# Patient Record
Sex: Female | Born: 1980 | Race: Black or African American | Hispanic: No | Marital: Married | Smoking: Never smoker
Health system: Southern US, Community
[De-identification: ages and names within clinical notes are randomized; demographics above are authoritative.]

## PROBLEM LIST (undated history)

## (undated) DIAGNOSIS — F419 Anxiety disorder, unspecified: Secondary | ICD-10-CM

## (undated) DIAGNOSIS — R011 Cardiac murmur, unspecified: Secondary | ICD-10-CM

## (undated) DIAGNOSIS — B019 Varicella without complication: Secondary | ICD-10-CM

## (undated) DIAGNOSIS — D573 Sickle-cell trait: Secondary | ICD-10-CM

## (undated) DIAGNOSIS — R0602 Shortness of breath: Secondary | ICD-10-CM

## (undated) DIAGNOSIS — F32A Depression, unspecified: Secondary | ICD-10-CM

## (undated) DIAGNOSIS — M549 Dorsalgia, unspecified: Secondary | ICD-10-CM

## (undated) DIAGNOSIS — I209 Angina pectoris, unspecified: Secondary | ICD-10-CM

## (undated) DIAGNOSIS — G5603 Carpal tunnel syndrome, bilateral upper limbs: Secondary | ICD-10-CM

## (undated) DIAGNOSIS — J40 Bronchitis, not specified as acute or chronic: Secondary | ICD-10-CM

## (undated) DIAGNOSIS — F329 Major depressive disorder, single episode, unspecified: Secondary | ICD-10-CM

## (undated) DIAGNOSIS — L709 Acne, unspecified: Secondary | ICD-10-CM

## (undated) DIAGNOSIS — R51 Headache: Secondary | ICD-10-CM

## (undated) DIAGNOSIS — E785 Hyperlipidemia, unspecified: Secondary | ICD-10-CM

## (undated) DIAGNOSIS — G4730 Sleep apnea, unspecified: Secondary | ICD-10-CM

## (undated) DIAGNOSIS — D649 Anemia, unspecified: Secondary | ICD-10-CM

## (undated) HISTORY — DX: Anemia, unspecified: D64.9

## (undated) HISTORY — DX: Varicella without complication: B01.9

## (undated) HISTORY — DX: Depression, unspecified: F32.A

## (undated) HISTORY — DX: Shortness of breath: R06.02

## (undated) HISTORY — PX: BACK SURGERY: SHX140

## (undated) HISTORY — DX: Hyperlipidemia, unspecified: E78.5

## (undated) HISTORY — DX: Dorsalgia, unspecified: M54.9

## (undated) HISTORY — DX: Major depressive disorder, single episode, unspecified: F32.9

---
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
## 2002-01-31 HISTORY — PX: DIAGNOSTIC LAPAROSCOPY: SUR761

## 2003-05-02 HISTORY — PX: ANKLE SURGERY: SHX546

## 2005-03-31 HISTORY — PX: TONSILLECTOMY: SUR1361

## 2005-07-27 ENCOUNTER — Emergency Department: Payer: Self-pay | Admitting: General Practice

## 2005-12-10 DIAGNOSIS — O24919 Unspecified diabetes mellitus in pregnancy, unspecified trimester: Secondary | ICD-10-CM | POA: Insufficient documentation

## 2006-10-11 ENCOUNTER — Emergency Department (HOSPITAL_COMMUNITY): Admission: EM | Admit: 2006-10-11 | Discharge: 2006-10-11 | Payer: Self-pay | Admitting: Emergency Medicine

## 2007-08-01 HISTORY — PX: VAGINAL HYSTERECTOMY: SUR661

## 2009-10-01 HISTORY — PX: LAMINECTOMY AND MICRODISCECTOMY LUMBAR SPINE: SHX1913

## 2009-10-14 ENCOUNTER — Ambulatory Visit (HOSPITAL_COMMUNITY): Admission: RE | Admit: 2009-10-14 | Discharge: 2009-10-15 | Payer: Self-pay | Admitting: Neurosurgery

## 2010-03-02 ENCOUNTER — Emergency Department (HOSPITAL_BASED_OUTPATIENT_CLINIC_OR_DEPARTMENT_OTHER)
Admission: EM | Admit: 2010-03-02 | Discharge: 2010-03-02 | Payer: Self-pay | Source: Home / Self Care | Admitting: Emergency Medicine

## 2010-03-02 LAB — URINALYSIS, ROUTINE W REFLEX MICROSCOPIC
Hgb urine dipstick: NEGATIVE
Specific Gravity, Urine: 1.021 (ref 1.005–1.030)
Urine Glucose, Fasting: NEGATIVE mg/dL
pH: 5.5 (ref 5.0–8.0)

## 2010-03-02 LAB — DIFFERENTIAL
Basophils Absolute: 0 10*3/uL (ref 0.0–0.1)
Lymphocytes Relative: 32 % (ref 12–46)
Lymphs Abs: 2.3 10*3/uL (ref 0.7–4.0)
Neutro Abs: 4.3 10*3/uL (ref 1.7–7.7)

## 2010-03-02 LAB — CBC
HCT: 36.5 % (ref 36.0–46.0)
Hemoglobin: 12.3 g/dL (ref 12.0–15.0)
WBC: 7.1 10*3/uL (ref 4.0–10.5)

## 2010-03-02 LAB — GLUCOSE, CAPILLARY
Glucose-Capillary: 95 mg/dL (ref 70–99)
Glucose-Capillary: 99 mg/dL (ref 70–99)

## 2010-03-02 LAB — BASIC METABOLIC PANEL
CO2: 24 mEq/L (ref 19–32)
Calcium: 10.1 mg/dL (ref 8.4–10.5)
Chloride: 107 mEq/L (ref 96–112)
GFR calc Af Amer: >60 mL/min (ref >60)
Potassium: 4.2 mEq/L (ref 3.5–5.1)
Sodium: 143 mEq/L (ref 135–145)

## 2010-04-13 ENCOUNTER — Other Ambulatory Visit: Payer: Self-pay | Admitting: Neurosurgery

## 2010-04-13 DIAGNOSIS — M545 Low back pain, unspecified: Secondary | ICD-10-CM

## 2010-04-13 DIAGNOSIS — M79604 Pain in right leg: Secondary | ICD-10-CM

## 2010-04-15 LAB — BASIC METABOLIC PANEL
BUN: 7 mg/dL (ref 6–23)
CO2: 28 mEq/L (ref 19–32)
Calcium: 9.5 mg/dL (ref 8.4–10.5)
Chloride: 102 mEq/L (ref 96–112)
Creatinine, Ser: 0.92 mg/dL (ref 0.4–1.2)
GFR calc Af Amer: >60 mL/min (ref >60)
GFR calc non Af Amer: >60 mL/min (ref >60)
Glucose, Bld: 120 mg/dL — ABNORMAL HIGH (ref 70–99)
Potassium: 4.6 mEq/L (ref 3.5–5.1)
Sodium: 137 mEq/L (ref 135–145)

## 2010-04-15 LAB — SURGICAL PCR SCREEN
MRSA, PCR: NEGATIVE
Staphylococcus aureus: NEGATIVE

## 2010-04-15 LAB — CBC
HCT: 37.5 % (ref 36.0–46.0)
Platelets: 332 10*3/uL (ref 150–400)
RDW: 13.6 % (ref 11.5–15.5)
WBC: 6.9 10*3/uL (ref 4.0–10.5)

## 2010-04-15 LAB — GLUCOSE, CAPILLARY
Glucose-Capillary: 103 mg/dL — ABNORMAL HIGH (ref 70–99)
Glucose-Capillary: 104 mg/dL — ABNORMAL HIGH (ref 70–99)
Glucose-Capillary: 107 mg/dL — ABNORMAL HIGH (ref 70–99)

## 2010-04-16 ENCOUNTER — Ambulatory Visit
Admission: RE | Admit: 2010-04-16 | Discharge: 2010-04-16 | Disposition: A | Discharge status: Home / Self Care | Payer: Medicare HMO | Source: Ambulatory Visit | Attending: Neurosurgery | Admitting: Neurosurgery

## 2010-04-16 DIAGNOSIS — M545 Low back pain, unspecified: Secondary | ICD-10-CM

## 2010-04-16 DIAGNOSIS — M79604 Pain in right leg: Secondary | ICD-10-CM

## 2010-05-18 ENCOUNTER — Emergency Department (HOSPITAL_COMMUNITY)
Admission: EM | Admit: 2010-05-18 | Discharge: 2010-05-18 | Disposition: A | Discharge status: Home / Self Care | Payer: No Typology Code available for payment source | Attending: Emergency Medicine | Admitting: Emergency Medicine

## 2010-05-18 DIAGNOSIS — R209 Unspecified disturbances of skin sensation: Secondary | ICD-10-CM | POA: Insufficient documentation

## 2010-05-18 DIAGNOSIS — M79609 Pain in unspecified limb: Secondary | ICD-10-CM | POA: Insufficient documentation

## 2010-05-18 DIAGNOSIS — E7800 Pure hypercholesterolemia, unspecified: Secondary | ICD-10-CM | POA: Insufficient documentation

## 2010-05-18 DIAGNOSIS — Y929 Unspecified place or not applicable: Secondary | ICD-10-CM | POA: Insufficient documentation

## 2010-05-18 DIAGNOSIS — M545 Low back pain, unspecified: Secondary | ICD-10-CM | POA: Insufficient documentation

## 2010-05-18 DIAGNOSIS — E119 Type 2 diabetes mellitus without complications: Secondary | ICD-10-CM | POA: Insufficient documentation

## 2010-05-18 DIAGNOSIS — Z79899 Other long term (current) drug therapy: Secondary | ICD-10-CM | POA: Insufficient documentation

## 2010-05-18 DIAGNOSIS — M5380 Other specified dorsopathies, site unspecified: Secondary | ICD-10-CM | POA: Insufficient documentation

## 2010-07-04 ENCOUNTER — Emergency Department (HOSPITAL_BASED_OUTPATIENT_CLINIC_OR_DEPARTMENT_OTHER)
Admission: EM | Admit: 2010-07-04 | Discharge: 2010-07-04 | Disposition: A | Discharge status: Home / Self Care | Payer: Managed Care, Other (non HMO) | Attending: Emergency Medicine | Admitting: Emergency Medicine

## 2010-07-04 DIAGNOSIS — E119 Type 2 diabetes mellitus without complications: Secondary | ICD-10-CM | POA: Insufficient documentation

## 2010-07-04 DIAGNOSIS — E7800 Pure hypercholesterolemia, unspecified: Secondary | ICD-10-CM | POA: Insufficient documentation

## 2010-07-04 DIAGNOSIS — R112 Nausea with vomiting, unspecified: Secondary | ICD-10-CM | POA: Insufficient documentation

## 2010-07-04 LAB — URINALYSIS, ROUTINE W REFLEX MICROSCOPIC
Nitrite: NEGATIVE
Protein, ur: NEGATIVE mg/dL
Urobilinogen, UA: 0.2 mg/dL (ref 0.0–1.0)

## 2010-07-04 LAB — BASIC METABOLIC PANEL
BUN: 11 mg/dL (ref 6–23)
Creatinine, Ser: 0.9 mg/dL (ref 0.4–1.5)

## 2010-07-04 LAB — PREGNANCY, URINE: Preg Test, Ur: NEGATIVE

## 2010-08-02 ENCOUNTER — Encounter (HOSPITAL_COMMUNITY)
Admission: RE | Admit: 2010-08-02 | Discharge: 2010-08-02 | Disposition: A | Discharge status: Home / Self Care | Payer: Medicaid Other | Source: Ambulatory Visit | Attending: Neurosurgery | Admitting: Neurosurgery

## 2010-08-02 DIAGNOSIS — Z01812 Encounter for preprocedural laboratory examination: Secondary | ICD-10-CM | POA: Insufficient documentation

## 2010-08-02 LAB — BASIC METABOLIC PANEL
Chloride: 103 mEq/L (ref 96–112)
Creatinine, Ser: 0.82 mg/dL (ref 0.50–1.10)
GFR calc Af Amer: >60 mL/min (ref >60)
GFR calc non Af Amer: >60 mL/min (ref >60)
Potassium: 4.2 mEq/L (ref 3.5–5.1)

## 2010-08-02 LAB — CBC
MCHC: 33.4 g/dL (ref 30.0–36.0)
Platelets: 323 10*3/uL (ref 150–400)
RDW: 13.4 % (ref 11.5–15.5)
WBC: 7.2 10*3/uL (ref 4.0–10.5)

## 2010-08-02 LAB — TYPE AND SCREEN
ABO/RH(D): O POS
Antibody Screen: NEGATIVE

## 2010-08-02 LAB — SURGICAL PCR SCREEN: MRSA, PCR: NEGATIVE

## 2010-08-09 ENCOUNTER — Inpatient Hospital Stay (HOSPITAL_COMMUNITY): Admission: RE | Admit: 2010-08-09 | Payer: Medicaid Other | Source: Ambulatory Visit | Admitting: Neurosurgery

## 2010-12-02 ENCOUNTER — Other Ambulatory Visit: Payer: Self-pay | Admitting: Neurosurgery

## 2010-12-02 DIAGNOSIS — M5126 Other intervertebral disc displacement, lumbar region: Secondary | ICD-10-CM

## 2010-12-09 ENCOUNTER — Ambulatory Visit
Admission: RE | Admit: 2010-12-09 | Discharge: 2010-12-09 | Disposition: A | Discharge status: Home / Self Care | Payer: Medicaid Other | Source: Ambulatory Visit | Attending: Neurosurgery | Admitting: Neurosurgery

## 2010-12-09 DIAGNOSIS — M5126 Other intervertebral disc displacement, lumbar region: Secondary | ICD-10-CM

## 2010-12-09 MED ORDER — GADOBENATE DIMEGLUMINE 529 MG/ML IV SOLN
20.0000 mL | Freq: Once | INTRAVENOUS | Status: AC | PRN
  Administered 2010-12-09: 20 mL via INTRAVENOUS

## 2010-12-22 ENCOUNTER — Encounter (HOSPITAL_COMMUNITY): Payer: Self-pay | Admitting: Pharmacy Technician

## 2010-12-29 ENCOUNTER — Encounter (HOSPITAL_COMMUNITY)
Admission: RE | Admit: 2010-12-29 | Discharge: 2010-12-29 | Disposition: A | Discharge status: Home / Self Care | Payer: Managed Care, Other (non HMO) | Source: Ambulatory Visit | Attending: Anesthesiology | Admitting: Anesthesiology

## 2010-12-29 ENCOUNTER — Other Ambulatory Visit: Payer: Self-pay

## 2010-12-29 ENCOUNTER — Encounter (HOSPITAL_COMMUNITY): Payer: Self-pay

## 2010-12-29 ENCOUNTER — Encounter (HOSPITAL_COMMUNITY)
Admission: RE | Admit: 2010-12-29 | Discharge: 2010-12-29 | Disposition: A | Discharge status: Home / Self Care | Payer: Managed Care, Other (non HMO) | Source: Ambulatory Visit | Attending: Neurosurgery | Admitting: Neurosurgery

## 2010-12-29 HISTORY — DX: Headache: R51

## 2010-12-29 HISTORY — DX: Anxiety disorder, unspecified: F41.9

## 2010-12-29 HISTORY — DX: Acne, unspecified: L70.9

## 2010-12-29 HISTORY — DX: Bronchitis, not specified as acute or chronic: J40

## 2010-12-29 HISTORY — DX: Sleep apnea, unspecified: G47.30

## 2010-12-29 LAB — CBC
HCT: 37.3 % (ref 36.0–46.0)
Hemoglobin: 12.2 g/dL (ref 12.0–15.0)
MCHC: 32.7 g/dL (ref 30.0–36.0)
MCV: 81.4 fL (ref 78.0–100.0)
WBC: 4.7 10*3/uL (ref 4.0–10.5)

## 2010-12-29 LAB — PREGNANCY, URINE: Preg Test, Ur: NEGATIVE

## 2010-12-29 LAB — SURGICAL PCR SCREEN
MRSA, PCR: NEGATIVE
Staphylococcus aureus: NEGATIVE

## 2010-12-29 LAB — BASIC METABOLIC PANEL
BUN: 7 mg/dL (ref 6–23)
Chloride: 105 mEq/L (ref 96–112)
Glucose, Bld: 92 mg/dL (ref 70–99)
Potassium: 4.5 mEq/L (ref 3.5–5.1)

## 2010-12-29 NOTE — Pre-Procedure Instructions (Signed)
20 Maria Johnson  12/29/2010   Your procedure is scheduled on:  Thursday January 06, 2011  Report to [facility] at 0530 AM.  Call this number if you have problems the morning of surgery: [phone]   Remember:   Do not eat food:After Midnight.  May have clear liquids: up to 4 Hours before arrival. (1:30am)  Clear liquids include soda, tea, black coffee, apple or grape juice, broth.  Take these medicines the morning of surgery with A SIP OF WATER: xanax, tramadol   Do not wear jewelry, make-up or nail polish.  Do not wear lotions, powders, or perfumes. You may wear deodorant.  Do not shave 48 hours prior to surgery.  Do not bring valuables to the hospital.  Contacts, dentures or bridgework may not be worn into surgery.  Leave suitcase in the car. After surgery it may be brought to your room.  For patients admitted to the hospital, checkout time is 11:00 AM the day of discharge.   Patients discharged the day of surgery will not be allowed to drive home.  Name and phone number of your driver: Will Bonnet [phone]  Special Instructions: CHG Shower Use Special Wash: 1/2 bottle night before surgery and 1/2 bottle morning of surgery.   Please read over the following fact sheets that you were given: Pain Booklet, Coughing and Deep Breathing, Blood Transfusion Information, MRSA Information and Surgical Site Infection Prevention, Bring Brace Day of Surgery

## 2011-01-05 MED ORDER — CEFAZOLIN SODIUM-DEXTROSE 2-3 GM-% IV SOLR
2.0000 g | INTRAVENOUS | Status: AC
  Administered 2011-01-06: 2 g via INTRAVENOUS
  Filled 2011-01-05: qty 50

## 2011-01-06 ENCOUNTER — Inpatient Hospital Stay (HOSPITAL_COMMUNITY)
Admission: RE | Admit: 2011-01-06 | Discharge: 2011-01-10 | DRG: 460 | Disposition: A | Discharge status: Home Health | Payer: Managed Care, Other (non HMO) | Source: Ambulatory Visit | Attending: Neurosurgery | Admitting: Neurosurgery

## 2011-01-06 ENCOUNTER — Inpatient Hospital Stay (HOSPITAL_COMMUNITY): Payer: Managed Care, Other (non HMO)

## 2011-01-06 ENCOUNTER — Encounter (HOSPITAL_COMMUNITY): Payer: Self-pay | Admitting: Anesthesiology

## 2011-01-06 ENCOUNTER — Encounter (HOSPITAL_COMMUNITY): Payer: Self-pay | Admitting: *Deleted

## 2011-01-06 ENCOUNTER — Encounter (HOSPITAL_COMMUNITY)
Admission: RE | Disposition: A | Discharge status: Home Health | Payer: Self-pay | Source: Ambulatory Visit | Attending: Neurosurgery

## 2011-01-06 ENCOUNTER — Inpatient Hospital Stay (HOSPITAL_COMMUNITY): Payer: Managed Care, Other (non HMO) | Admitting: Anesthesiology

## 2011-01-06 DIAGNOSIS — M47817 Spondylosis without myelopathy or radiculopathy, lumbosacral region: Secondary | ICD-10-CM | POA: Diagnosis present

## 2011-01-06 DIAGNOSIS — M5137 Other intervertebral disc degeneration, lumbosacral region: Secondary | ICD-10-CM | POA: Diagnosis present

## 2011-01-06 DIAGNOSIS — Z01818 Encounter for other preprocedural examination: Secondary | ICD-10-CM

## 2011-01-06 DIAGNOSIS — M4317 Spondylolisthesis, lumbosacral region: Secondary | ICD-10-CM

## 2011-01-06 DIAGNOSIS — M51379 Other intervertebral disc degeneration, lumbosacral region without mention of lumbar back pain or lower extremity pain: Secondary | ICD-10-CM | POA: Diagnosis present

## 2011-01-06 DIAGNOSIS — Z01812 Encounter for preprocedural laboratory examination: Secondary | ICD-10-CM

## 2011-01-06 DIAGNOSIS — I959 Hypotension, unspecified: Secondary | ICD-10-CM | POA: Diagnosis present

## 2011-01-06 DIAGNOSIS — R079 Chest pain, unspecified: Secondary | ICD-10-CM | POA: Diagnosis present

## 2011-01-06 DIAGNOSIS — M549 Dorsalgia, unspecified: Secondary | ICD-10-CM

## 2011-01-06 DIAGNOSIS — Q762 Congenital spondylolisthesis: Secondary | ICD-10-CM

## 2011-01-06 DIAGNOSIS — M5126 Other intervertebral disc displacement, lumbar region: Principal | ICD-10-CM | POA: Diagnosis present

## 2011-01-06 DIAGNOSIS — E119 Type 2 diabetes mellitus without complications: Secondary | ICD-10-CM | POA: Diagnosis present

## 2011-01-06 DIAGNOSIS — G4730 Sleep apnea, unspecified: Secondary | ICD-10-CM | POA: Diagnosis present

## 2011-01-06 DIAGNOSIS — E7800 Pure hypercholesterolemia, unspecified: Secondary | ICD-10-CM | POA: Diagnosis present

## 2011-01-06 DIAGNOSIS — Z01810 Encounter for preprocedural cardiovascular examination: Secondary | ICD-10-CM

## 2011-01-06 HISTORY — DX: Carpal tunnel syndrome, bilateral upper limbs: G56.03

## 2011-01-06 HISTORY — DX: Sickle-cell trait: D57.3

## 2011-01-06 HISTORY — PX: ANTERIOR LUMBAR FUSION: SHX1170

## 2011-01-06 HISTORY — PX: LUMBAR FUSION: SHX111

## 2011-01-06 LAB — GLUCOSE, CAPILLARY: Glucose-Capillary: 98 mg/dL (ref 70–99)

## 2011-01-06 SURGERY — ANTERIOR LUMBAR FUSION 1 LEVEL
Anesthesia: General | Site: Abdomen | Wound class: Clean

## 2011-01-06 MED ORDER — TRAMADOL HCL 50 MG PO TABS
50.0000 mg | ORAL_TABLET | Freq: Four times a day (QID) | ORAL | Status: DC | PRN
  Filled 2011-01-06: qty 1

## 2011-01-06 MED ORDER — OXYCODONE-ACETAMINOPHEN 5-325 MG PO TABS
1.0000 | ORAL_TABLET | ORAL | Status: DC | PRN
Start: 2011-01-06 — End: 2011-01-10
  Administered 2011-01-08 – 2011-01-10 (×7): 2 via ORAL
  Filled 2011-01-06 (×8): qty 2

## 2011-01-06 MED ORDER — PHENOL 1.4 % MT LIQD: 1.0000 | OROMUCOSAL | Status: DC | PRN

## 2011-01-06 MED ORDER — PROMETHAZINE HCL 25 MG/ML IJ SOLN: 6.2500 mg | INTRAMUSCULAR | Status: DC | PRN

## 2011-01-06 MED ORDER — PANTOPRAZOLE SODIUM 40 MG IV SOLR
40.0000 mg | Freq: Every day | INTRAVENOUS | Status: DC
  Administered 2011-01-06 – 2011-01-08 (×3): 40 mg via INTRAVENOUS
  Filled 2011-01-06 (×4): qty 40

## 2011-01-06 MED ORDER — NALOXONE HCL 0.4 MG/ML IJ SOLN: 0.4000 mg | INTRAMUSCULAR | Status: DC | PRN

## 2011-01-06 MED ORDER — MORPHINE SULFATE (PF) 1 MG/ML IV SOLN
INTRAVENOUS | Status: DC
  Administered 2011-01-06: 25 mg via INTRAVENOUS
  Administered 2011-01-06: 3 mg via INTRAVENOUS
  Administered 2011-01-07: 10.5 mg via INTRAVENOUS
  Administered 2011-01-07: 1.5 mg via INTRAVENOUS
  Administered 2011-01-07: 6 mg via INTRAVENOUS
  Administered 2011-01-07: 3 mg via INTRAVENOUS
  Administered 2011-01-08: 1.5 mg via INTRAVENOUS
  Administered 2011-01-08: 4.75 mg via INTRAVENOUS
  Filled 2011-01-06 (×3): qty 25

## 2011-01-06 MED ORDER — SODIUM CHLORIDE 0.9 % IJ SOLN: 3.0000 mL | INTRAMUSCULAR | Status: DC | PRN

## 2011-01-06 MED ORDER — LACTATED RINGERS IV SOLN
INTRAVENOUS | Status: DC | PRN
  Administered 2011-01-06 (×3): via INTRAVENOUS

## 2011-01-06 MED ORDER — ONDANSETRON HCL 4 MG/2ML IJ SOLN: 4.0000 mg | Freq: Four times a day (QID) | INTRAMUSCULAR | Status: DC | PRN

## 2011-01-06 MED ORDER — THROMBIN 5000 UNITS EX KIT
PACK | CUTANEOUS | Status: DC | PRN
  Administered 2011-01-06 (×2): 5000 [IU] via TOPICAL

## 2011-01-06 MED ORDER — CYCLOBENZAPRINE HCL 10 MG PO TABS
10.0000 mg | ORAL_TABLET | Freq: Two times a day (BID) | ORAL | Status: DC
  Administered 2011-01-06 – 2011-01-10 (×8): 10 mg via ORAL
  Filled 2011-01-06 (×10): qty 1

## 2011-01-06 MED ORDER — MEPERIDINE HCL 25 MG/ML IJ SOLN: 6.2500 mg | INTRAMUSCULAR | Status: DC | PRN

## 2011-01-06 MED ORDER — ENOXAPARIN SODIUM 40 MG/0.4ML ~~LOC~~ SOLN
40.0000 mg | SUBCUTANEOUS | Status: DC
  Administered 2011-01-07 – 2011-01-10 (×4): 40 mg via SUBCUTANEOUS
  Filled 2011-01-06 (×5): qty 0.4

## 2011-01-06 MED ORDER — VITAMIN D (ERGOCALCIFEROL) 1.25 MG (50000 UNIT) PO CAPS
50000.0000 [IU] | ORAL_CAPSULE | ORAL | Status: DC
  Administered 2011-01-07: 50000 [IU] via ORAL
  Filled 2011-01-06: qty 1

## 2011-01-06 MED ORDER — HYDROMORPHONE HCL PF 1 MG/ML IJ SOLN: 0.2500 mg | INTRAMUSCULAR | Status: DC | PRN

## 2011-01-06 MED ORDER — HYDROCODONE-ACETAMINOPHEN 5-325 MG PO TABS
1.0000 | ORAL_TABLET | ORAL | Status: DC | PRN
  Administered 2011-01-06: 2 via ORAL
  Filled 2011-01-06: qty 2

## 2011-01-06 MED ORDER — HYDROMORPHONE HCL PF 1 MG/ML IJ SOLN
0.2500 mg | INTRAMUSCULAR | Status: DC | PRN
  Administered 2011-01-06 (×4): 0.25 mg via INTRAVENOUS

## 2011-01-06 MED ORDER — MIDAZOLAM HCL 2 MG/2ML IJ SOLN: 0.5000 mg | Freq: Once | INTRAMUSCULAR | Status: DC | PRN

## 2011-01-06 MED ORDER — ONDANSETRON HCL 4 MG/2ML IJ SOLN: 4.0000 mg | INTRAMUSCULAR | Status: DC | PRN

## 2011-01-06 MED ORDER — SODIUM CHLORIDE 0.9 % IJ SOLN: 9.0000 mL | INTRAMUSCULAR | Status: DC | PRN

## 2011-01-06 MED ORDER — MORPHINE SULFATE 2 MG/ML IJ SOLN: 0.0500 mg/kg | INTRAMUSCULAR | Status: DC | PRN

## 2011-01-06 MED ORDER — DOXYCYCLINE HYCLATE 100 MG PO CAPS
100.0000 mg | ORAL_CAPSULE | Freq: Every day | ORAL | Status: DC
  Administered 2011-01-06 – 2011-01-08 (×3): 100 mg via ORAL
  Filled 2011-01-06 (×4): qty 1

## 2011-01-06 MED ORDER — MIDAZOLAM HCL 5 MG/5ML IJ SOLN
INTRAMUSCULAR | Status: DC | PRN
  Administered 2011-01-06 (×2): 1 mg via INTRAVENOUS

## 2011-01-06 MED ORDER — INFLUENZA VIRUS VACC SPLIT PF IM SUSP
0.5000 mL | INTRAMUSCULAR | Status: AC
  Administered 2011-01-07: 0.5 mL via INTRAMUSCULAR
  Filled 2011-01-06: qty 0.5

## 2011-01-06 MED ORDER — ALUM & MAG HYDROXIDE-SIMETH 400-400-40 MG/5ML PO SUSP
30.0000 mL | Freq: Four times a day (QID) | ORAL | Status: DC | PRN
  Filled 2011-01-06: qty 30

## 2011-01-06 MED ORDER — DIPHENHYDRAMINE HCL 12.5 MG/5ML PO ELIX: 12.5000 mg | ORAL_SOLUTION | Freq: Four times a day (QID) | ORAL | Status: DC | PRN

## 2011-01-06 MED ORDER — SODIUM CHLORIDE 0.9 % IR SOLN
Status: DC | PRN
  Administered 2011-01-06: 1000 mL

## 2011-01-06 MED ORDER — INSULIN ASPART 100 UNIT/ML ~~LOC~~ SOLN
0.0000 [IU] | Freq: Three times a day (TID) | SUBCUTANEOUS | Status: DC
  Administered 2011-01-07: 2 [IU] via SUBCUTANEOUS
  Filled 2011-01-06: qty 3

## 2011-01-06 MED ORDER — ACETAMINOPHEN 325 MG PO TABS
650.0000 mg | ORAL_TABLET | ORAL | Status: DC | PRN
  Administered 2011-01-07: 650 mg via ORAL
  Filled 2011-01-06: qty 2

## 2011-01-06 MED ORDER — SODIUM CHLORIDE 0.9 % IV SOLN: 250.0000 mL | INTRAVENOUS | Status: DC

## 2011-01-06 MED ORDER — SIMVASTATIN 10 MG PO TABS
10.0000 mg | ORAL_TABLET | Freq: Every day | ORAL | Status: DC
  Administered 2011-01-06 – 2011-01-09 (×4): 10 mg via ORAL
  Filled 2011-01-06 (×6): qty 1

## 2011-01-06 MED ORDER — ACETAMINOPHEN 650 MG RE SUPP: 650.0000 mg | RECTAL | Status: DC | PRN

## 2011-01-06 MED ORDER — HEMOSTATIC AGENTS (NO CHARGE) OPTIME
TOPICAL | Status: DC | PRN
  Administered 2011-01-06: 1 via TOPICAL

## 2011-01-06 MED ORDER — HYDROCODONE-ACETAMINOPHEN 5-325 MG PO TABS: 1.0000 | ORAL_TABLET | ORAL | Status: DC | PRN

## 2011-01-06 MED ORDER — ROCURONIUM BROMIDE 100 MG/10ML IV SOLN
INTRAVENOUS | Status: DC | PRN
  Administered 2011-01-06: 5 mg via INTRAVENOUS
  Administered 2011-01-06: 50 mg via INTRAVENOUS
  Administered 2011-01-06 (×3): 10 mg via INTRAVENOUS

## 2011-01-06 MED ORDER — SODIUM CHLORIDE 0.9 % IR SOLN
Status: DC | PRN
  Administered 2011-01-06: 09:00:00

## 2011-01-06 MED ORDER — PNEUMOCOCCAL VAC POLYVALENT 25 MCG/0.5ML IJ INJ
0.5000 mL | INJECTION | INTRAMUSCULAR | Status: AC
  Administered 2011-01-07: 0.5 mL via INTRAMUSCULAR
  Filled 2011-01-06: qty 0.5

## 2011-01-06 MED ORDER — PROPOFOL 10 MG/ML IV EMUL
INTRAVENOUS | Status: DC | PRN
  Administered 2011-01-06: 140 mg via INTRAVENOUS
  Administered 2011-01-06: 50 mg via INTRAVENOUS

## 2011-01-06 MED ORDER — METFORMIN HCL ER 500 MG PO TB24
1000.0000 mg | ORAL_TABLET | Freq: Every day | ORAL | Status: DC
  Administered 2011-01-07 – 2011-01-10 (×4): 1000 mg via ORAL
  Filled 2011-01-06 (×5): qty 2

## 2011-01-06 MED ORDER — INSULIN ASPART 100 UNIT/ML ~~LOC~~ SOLN
4.0000 [IU] | Freq: Three times a day (TID) | SUBCUTANEOUS | Status: DC
Start: 2011-01-06 — End: 2011-01-09
  Administered 2011-01-06 – 2011-01-07 (×3): 4 [IU] via SUBCUTANEOUS
  Filled 2011-01-06: qty 3

## 2011-01-06 MED ORDER — SODIUM CHLORIDE 0.9 % IJ SOLN
3.0000 mL | Freq: Two times a day (BID) | INTRAMUSCULAR | Status: DC
  Administered 2011-01-07 (×2): 3 mL via INTRAVENOUS

## 2011-01-06 MED ORDER — DIAZEPAM 5 MG PO TABS
5.0000 mg | ORAL_TABLET | Freq: Four times a day (QID) | ORAL | Status: DC | PRN
  Administered 2011-01-06 – 2011-01-07 (×2): 5 mg via ORAL
  Filled 2011-01-06 (×2): qty 1

## 2011-01-06 MED ORDER — ZOLPIDEM TARTRATE 10 MG PO TABS: 10.0000 mg | ORAL_TABLET | Freq: Every evening | ORAL | Status: DC | PRN

## 2011-01-06 MED ORDER — LACTATED RINGERS IV SOLN: INTRAVENOUS | Status: DC

## 2011-01-06 MED ORDER — MENTHOL 3 MG MT LOZG: 1.0000 | LOZENGE | OROMUCOSAL | Status: DC | PRN

## 2011-01-06 MED ORDER — NEOSTIGMINE METHYLSULFATE 1 MG/ML IJ SOLN
INTRAMUSCULAR | Status: DC | PRN
  Administered 2011-01-06: 3 mg via INTRAVENOUS

## 2011-01-06 MED ORDER — ALPRAZOLAM 0.5 MG PO TABS: 1.0000 mg | ORAL_TABLET | Freq: Every day | ORAL | Status: DC | PRN

## 2011-01-06 MED ORDER — ONDANSETRON HCL 4 MG/2ML IJ SOLN
INTRAMUSCULAR | Status: DC | PRN
  Administered 2011-01-06: 4 mg via INTRAVENOUS

## 2011-01-06 MED ORDER — INSULIN ASPART 100 UNIT/ML ~~LOC~~ SOLN: 0.0000 [IU] | Freq: Every day | SUBCUTANEOUS | Status: DC

## 2011-01-06 MED ORDER — FENTANYL CITRATE 0.05 MG/ML IJ SOLN
INTRAMUSCULAR | Status: DC | PRN
  Administered 2011-01-06: 50 ug via INTRAVENOUS
  Administered 2011-01-06: 100 ug via INTRAVENOUS
  Administered 2011-01-06 (×4): 50 ug via INTRAVENOUS
  Administered 2011-01-06: 150 ug via INTRAVENOUS

## 2011-01-06 MED ORDER — GLYCOPYRROLATE 0.2 MG/ML IJ SOLN
INTRAMUSCULAR | Status: DC | PRN
  Administered 2011-01-06: .4 mg via INTRAVENOUS

## 2011-01-06 MED ORDER — HETASTARCH-ELECTROLYTES 6 % IV SOLN
INTRAVENOUS | Status: DC | PRN
  Administered 2011-01-06: 09:00:00 via INTRAVENOUS

## 2011-01-06 MED ORDER — CEFAZOLIN SODIUM 1-5 GM-% IV SOLN
1.0000 g | Freq: Three times a day (TID) | INTRAVENOUS | Status: AC
  Administered 2011-01-06 (×2): 1 g via INTRAVENOUS
  Filled 2011-01-06 (×2): qty 50

## 2011-01-06 MED ORDER — KCL IN DEXTROSE-NACL 20-5-0.45 MEQ/L-%-% IV SOLN
INTRAVENOUS | Status: DC
  Administered 2011-01-06: 17:00:00 via INTRAVENOUS
  Filled 2011-01-06 (×5): qty 1000

## 2011-01-06 MED ORDER — BISACODYL 10 MG RE SUPP
10.0000 mg | Freq: Every day | RECTAL | Status: DC | PRN
  Filled 2011-01-06: qty 1

## 2011-01-06 MED ORDER — DIPHENHYDRAMINE HCL 50 MG/ML IJ SOLN: 12.5000 mg | Freq: Four times a day (QID) | INTRAMUSCULAR | Status: DC | PRN

## 2011-01-06 SURGICAL SUPPLY — 85 items
12mm Sacral Plate ×2 IMPLANT
25mm Screw ×6 IMPLANT
ALIF Cage Medium 14mm 12 degree ×2 IMPLANT
APPLIER CLIP 11 MED OPEN (CLIP) ×2
BUR BARREL STRAIGHT FLUTE 4.0 (BURR) IMPLANT
CANISTER SUCTION 2500CC (MISCELLANEOUS) ×2 IMPLANT
CLIP APPLIE 11 MED OPEN (CLIP) ×1 IMPLANT
CLOTH BEACON ORANGE TIMEOUT ST (SAFETY) ×2 IMPLANT
CONT SPEC 4OZ CLIKSEAL STRL BL (MISCELLANEOUS) ×2 IMPLANT
COVER BACK TABLE 24X17X13 BIG (DRAPES) IMPLANT
COVER TABLE BACK 60X90 (DRAPES) IMPLANT
DERMABOND ADVANCED (GAUZE/BANDAGES/DRESSINGS) ×1
DERMABOND ADVANCED .7 DNX12 (GAUZE/BANDAGES/DRESSINGS) ×1 IMPLANT
DRAPE C-ARM 42X72 X-RAY (DRAPES) ×6 IMPLANT
DRAPE INCISE IOBAN 66X45 STRL (DRAPES) IMPLANT
DRAPE LAPAROTOMY 100X72X124 (DRAPES) ×2 IMPLANT
DRAPE POUCH INSTRU U-SHP 10X18 (DRAPES) ×2 IMPLANT
DRESSING TELFA 8X3 (GAUZE/BANDAGES/DRESSINGS) IMPLANT
DURAPREP 26ML APPLICATOR (WOUND CARE) ×2 IMPLANT
ELECT BLADE 4.0 EZ CLEAN MEGAD (MISCELLANEOUS) ×2
ELECT REM PT RETURN 9FT ADLT (ELECTROSURGICAL) ×2
ELECTRODE BLDE 4.0 EZ CLN MEGD (MISCELLANEOUS) ×1 IMPLANT
ELECTRODE REM PT RTRN 9FT ADLT (ELECTROSURGICAL) ×1 IMPLANT
GAUZE SPONGE 4X4 16PLY XRAY LF (GAUZE/BANDAGES/DRESSINGS) IMPLANT
GLOVE BIO SURGEON STRL SZ8 (GLOVE) ×2 IMPLANT
GLOVE BIOGEL PI IND STRL 7.5 (GLOVE) ×1 IMPLANT
GLOVE BIOGEL PI IND STRL 8 (GLOVE) ×1 IMPLANT
GLOVE BIOGEL PI IND STRL 8.5 (GLOVE) ×1 IMPLANT
GLOVE BIOGEL PI INDICATOR 7.5 (GLOVE) ×1
GLOVE BIOGEL PI INDICATOR 8 (GLOVE) ×1
GLOVE BIOGEL PI INDICATOR 8.5 (GLOVE) ×1
GLOVE ECLIPSE 7.5 STRL STRAW (GLOVE) ×10 IMPLANT
GLOVE EXAM NITRILE LRG STRL (GLOVE) IMPLANT
GLOVE EXAM NITRILE MD LF STRL (GLOVE) ×2 IMPLANT
GLOVE EXAM NITRILE XL STR (GLOVE) IMPLANT
GLOVE EXAM NITRILE XS STR PU (GLOVE) IMPLANT
GOWN BRE IMP SLV AUR LG STRL (GOWN DISPOSABLE) IMPLANT
GOWN BRE IMP SLV AUR XL STRL (GOWN DISPOSABLE) ×2 IMPLANT
GOWN STRL NON-REIN LRG LVL3 (GOWN DISPOSABLE) ×2 IMPLANT
GOWN STRL REIN 2XL LVL4 (GOWN DISPOSABLE) ×4 IMPLANT
GRANULES NEXOSS CDS 10CC (Bone Implant) ×2 IMPLANT
IMBIBE BONE MARROW NEEDLE ×2 IMPLANT
INSERT FOGARTY 61MM (MISCELLANEOUS) IMPLANT
INSERT FOGARTY SM (MISCELLANEOUS) IMPLANT
KIT BASIN OR (CUSTOM PROCEDURE TRAY) ×2 IMPLANT
KIT INFUSE X SMALL 1.4CC (Orthopedic Implant) ×2 IMPLANT
KIT ROOM TURNOVER OR (KITS) ×2 IMPLANT
LOOP VESSEL MAXI BLUE (MISCELLANEOUS) IMPLANT
LOOP VESSEL MINI RED (MISCELLANEOUS) IMPLANT
NEEDLE BONE MARROW 8GAX6 (NEEDLE) ×2 IMPLANT
NEEDLE HYPO 25X1 1.5 SAFETY (NEEDLE) IMPLANT
NEEDLE SPNL 18GX3.5 QUINCKE PK (NEEDLE) ×2 IMPLANT
NS IRRIG 1000ML POUR BTL (IV SOLUTION) ×2 IMPLANT
PACK LAMINECTOMY NEURO (CUSTOM PROCEDURE TRAY) ×2 IMPLANT
PAD ARMBOARD 7.5X6 YLW CONV (MISCELLANEOUS) ×4 IMPLANT
SCREW 30MM (Screw) ×2 IMPLANT
SPONGE GAUZE 4X4 12PLY (GAUZE/BANDAGES/DRESSINGS) IMPLANT
SPONGE INTESTINAL PEANUT (DISPOSABLE) ×8 IMPLANT
SPONGE LAP 18X18 X RAY DECT (DISPOSABLE) ×2 IMPLANT
SPONGE LAP 4X18 X RAY DECT (DISPOSABLE) IMPLANT
SPONGE SURGIFOAM ABS GEL SZ50 (HEMOSTASIS) ×2 IMPLANT
STAPLER VISISTAT 35W (STAPLE) IMPLANT
SUT PROLENE 4 0 RB 1 (SUTURE)
SUT PROLENE 4-0 RB1 .5 CRCL 36 (SUTURE) IMPLANT
SUT PROLENE 5 0 CC1 (SUTURE) IMPLANT
SUT SILK 0 TIES 10X30 (SUTURE) IMPLANT
SUT SILK 2 0 TIES 10X30 (SUTURE) IMPLANT
SUT SILK 2 0 TIES 17X18 (SUTURE) ×1
SUT SILK 2-0 18XBRD TIE BLK (SUTURE) ×1 IMPLANT
SUT SILK 3 0 TIES 10X30 (SUTURE) IMPLANT
SUT VIC AB 0 CT1 27 (SUTURE) ×1
SUT VIC AB 0 CT1 27XBRD ANBCTR (SUTURE) ×1 IMPLANT
SUT VIC AB 1 CT1 18XBRD ANBCTR (SUTURE) IMPLANT
SUT VIC AB 1 CT1 8-18 (SUTURE)
SUT VIC AB 2-0 CT1 18 (SUTURE) ×4 IMPLANT
SUT VIC AB 2-0 CT1 27 (SUTURE)
SUT VIC AB 2-0 CT1 27XBRD (SUTURE) IMPLANT
SUT VIC AB 3-0 SH 8-18 (SUTURE) ×4 IMPLANT
SUT VICRYL 4-0 PS2 18IN ABS (SUTURE) IMPLANT
SYR 20ML ECCENTRIC (SYRINGE) ×2 IMPLANT
TOWEL OR 17X24 6PK STRL BLUE (TOWEL DISPOSABLE) ×2 IMPLANT
TOWEL OR 17X26 10 PK STRL BLUE (TOWEL DISPOSABLE) ×2 IMPLANT
TRAP SPECIMEN MUCOUS 40CC (MISCELLANEOUS) ×2 IMPLANT
TRAY FOLEY CATH 14FRSI W/METER (CATHETERS) ×2 IMPLANT
WATER STERILE IRR 1000ML POUR (IV SOLUTION) ×2 IMPLANT

## 2011-01-06 NOTE — Preoperative (Signed)
Beta Blockers   Reason not to administer Beta Blockers:Not Applicable 

## 2011-01-06 NOTE — Transfer of Care (Signed)
Immediate Anesthesia Transfer of Care Note  Patient: Maria Johnson  Procedure(s) Performed:  ANTERIOR LUMBAR FUSION 1 LEVEL - Lumbar five-Sacral one  Anterior Lumbar Interbody Fusion with Instrumentation ; ABDOMINAL EXPOSURE - Anterior Exposure for Neuro procedure  Patient Location: PACU  Anesthesia Type: General  Level of Consciousness: sedated  Airway & Oxygen Therapy: Patient Spontanous Breathing and Patient connected to nasal cannula oxygen  Post-op Assessment: Report given to PACU RN, Post -op Vital signs reviewed and stable and Patient moving all extremities  Post vital signs: Reviewed and stable  Complications: No apparent anesthesia complications

## 2011-01-06 NOTE — Progress Notes (Signed)
VASCULAR PROGRESS NOTE  SUBJECTIVE: Comfortable  PHYSICAL EXAM: Filed Vitals:   01/06/11 0619 01/06/11 1145 01/06/11 1200 01/06/11 1300  BP: 114/76 97/49 112/60   Pulse: 91 123 119   Temp: 98.1 F (36.7 C) 98.4 F (36.9 C)  98 F (36.7 C)  TempSrc: Oral     Resp: 18 23 17    Height: 5\' 10"  (1.778 m)     Weight: 215 lb (97.523 kg)     SpO2: 98% 97% 100%     Dressing dry Palpable left DP pulse   Basename 01/06/11 0611  GLUCAP 98     ASSESSMENT/PLAN: 1. Doing well post op. Pain adequately controlled.   Waverly Ferrari, MD, FACS Beeper: [phone] 01/06/2011

## 2011-01-06 NOTE — Anesthesia Postprocedure Evaluation (Signed)
  Anesthesia Post-op Note  Patient: Maria Johnson  Procedure(s) Performed:  ANTERIOR LUMBAR FUSION 1 LEVEL - Lumbar five-Sacral one  Anterior Lumbar Interbody Fusion with Instrumentation ; ABDOMINAL EXPOSURE - Anterior Exposure for Neuro procedure  Patient Location: PACU  Anesthesia Type: General  Level of Consciousness: awake  Airway and Oxygen Therapy: Patient Spontanous Breathing  Post-op Pain: mild  Post-op Assessment: Post-op Vital signs reviewed  Post-op Vital Signs: stable  Complications: No apparent anesthesia complications

## 2011-01-06 NOTE — Op Note (Signed)
01/06/2011  11:21 AM  PATIENT:  Maria Johnson  30 y.o. female  PRE-OPERATIVE DIAGNOSIS:  Lumbar five-Sacral one spondylolisthesis, herniated nucleus pulposus  POST-OPERATIVE DIAGNOSIS:  Lumbar five-Sacral one spondylolisthesis, herniated nucleus pulposus  PROCEDURE:  Procedure(s): ANTERIOR LUMBAR FUSION 1 LEVEL ABDOMINAL EXPOSURE  SURGEON:  Surgeon(s): Dorian Heckle, MD Chuck Hint, MD  PHYSICIAN ASSISTANT:   ASSISTANTS: Poteat, RN   ANESTHESIA:   general  EBL:  Total I/O In: 2500 [I.V.:2000; IV Piggyback:500] Out: 300 [Urine:200; Blood:100]  BLOOD ADMINISTERED:none  DRAINS: none   LOCAL MEDICATIONS USED:  LIDOCAINE 10CC  SPECIMEN:  No Specimen  DISPOSITION OF SPECIMEN:  N/A  COUNTS:  YES  TOURNIQUET:  * No tourniquets in log *  DICTATION: After the smooth and uncomplicated induction of general endotracheal anesthesia, the patient was placed in the supine position on the operating table and C-arm fluoroscopy was brought in to visualize the L5-S1 interspace area of planned incision was marked and then infiltrated with local lidocaine after prepping and draping in the usual sterile fashion. Dr. Edilia Bo then proceeded to perform exposure and this will be dictated separately by him. After exposure was obtained, and this was confirmed with a lateral radiograph with a marker needle at the L5-S1 interspace, I proceeded to perform a thorough discectomy and decompression of the interspace. Cobb elevators were used after incising the anterior annulus to thoroughly remove the L5-S1 disc and this was removed en bloc. Distraction spacer 14 mm in width was then placed to facilitate further decompression of the interspace and disc material and redundant annulus were removed. thorough decompression of the lateral aspects of the interspace was also performed. A trial sizer was used with a 14 medium 7 lordotic implant and this was compared to a 12 lordotic implant as well. I  felt that the 12 implant fit the confines of the interspace better and therefore elected to use this implant. This was packed with extra small BMP and NexOss sponge which was mixed with menorrhagia blood aspirated from the vertebral body. The implant was tamped into position and its position was confirmed on AP and lateral fluoroscopy. A 12 mm sacral plate was utilized with 25 mm screws at S1 and 30 mm screw on the left at L5 and a 25 mm screw on the right at L5 all screws had excellent purchase and their positioning was confirmed on AP and lateral fluoroscopy locking mechanisms were engaged appropriately. The self-retaining retractors were then removed without evidence of soft tissue or vascular injury. A final radiograph was obtained which confirmed that there was no evidence of retained foreign body. The fascia was closed with 0 Vicryl sutures after the wound was extensively irrigated. 20 and 3-0 Vicryl interrupted stitches were used to reapproximate the fat and subcutaneous and subcuticular layers. A dressing of Dermabond was placed. Patient was extubated in the operating room and taken to recovery in stable satisfactory condition having told her operation well without apparent complication. Counts were correct at the end of the case.  PLAN OF CARE: Admit to inpatient   PATIENT DISPOSITION:  PACU - hemodynamically stable.   Delay start of Pharmacological VTE agent (>24hrs) due to surgical blood loss or risk of bleeding:  YES

## 2011-01-06 NOTE — Anesthesia Preprocedure Evaluation (Addendum)
Anesthesia Evaluation  Patient identified by MRN, date of birth, ID band Patient awake    Reviewed: Allergy & Precautions, H&P , NPO status , Patient's Chart, lab work & pertinent test results  Airway Mallampati: II TM Distance: >3 FB Neck ROM: Full    Dental  (+) Teeth Intact and Dental Advisory Given   Pulmonary sleep apnea and Continuous Positive Airway Pressure Ventilation , pneumonia ,          Cardiovascular neg cardio ROS     Neuro/Psych  Headaches,    GI/Hepatic   Endo/Other  Diabetes mellitus-, Type 2, Oral Hypoglycemic Agents  Renal/GU      Musculoskeletal negative musculoskeletal ROS (+)   Abdominal   Peds  Hematology   Anesthesia Other Findings   Reproductive/Obstetrics                         Anesthesia Physical Anesthesia Plan  ASA: II  Anesthesia Plan: General   Post-op Pain Management:    Induction: Intravenous  Airway Management Planned: Oral ETT  Additional Equipment:   Intra-op Plan:   Post-operative Plan: Extubation in OR  Informed Consent:   Dental advisory given  Plan Discussed with: Anesthesiologist and Surgeon  Anesthesia Plan Comments:        Anesthesia Quick Evaluation

## 2011-01-06 NOTE — Progress Notes (Signed)
Patient ID: Maria Johnson, female   DOB: 03-Nov-1980, 30 y.o.   MRN: [id number] Alert, conversant. No back pain. No leg pain. Pt does c/o abdominal pain at and medial to incision. BS hypo. Abd soft, nontender away from incision. Incision with Dermabond. No erythema, swelling, or drainage. Good strength BLE.

## 2011-01-06 NOTE — OR Nursing (Signed)
Preop pt states R leg pain. Pulse oximeter placed on L Great Toe for duration of procedure.

## 2011-01-06 NOTE — Op Note (Signed)
01/06/2011  PREOP DIAGNOSIS: Degenerative disc disease at L5-S1  POSTOP DIAGNOSIS: Same  PROCEDURE: Anterior retroperitoneal exposure of L5-S1  SURGEON: Di Kindle. Edilia Bo, MD, FACS  ASSIST: Georgiann Cocker  ANESTHESIA: Gen.   EBL: minimal  FINDINGS: L5-S1 disc was below the confluence of the iliac veins  INDICATIONS: This is a pleasant 30 year old woman with degenerative disc disease at L5-S1. I was asked to provide anterior retroperitoneal exposure.  TECHNIQUE: The patient was brought to the operating room and monitoring lines were placed by anesthesia. The patient received a general anesthetic. The abdomen was prepped and draped in the usual sterile fashion after the level of the L5-S1 disc was marked under fluoroscopy using a lateral projection. A transverse incision was made at this level and the dissection carried down through the subcutaneous tissue to the anterior rectus sheath. The anterior rectus sheath was divided transversely extending across the midline exposing a small amount of the right rectus abdominis muscle. Laterally the incision in the anterior rectus sheath was extended out to the lateral border of the left rectus abdominis muscle. The anterior rectus sheath was then mobilized superiorly and inferiorly allowing full mobilization of the rectus abdominis muscle. This was initially retracted medially. The retroperitoneal space was entered and the dissection carried down to the psoas muscle and then the iliac artery was mobilized and retracted laterally allowing exposure of the L5-S1 disc. The middle sacral vessels were doubly clipped and divided. The L5-S1 disc was exposed enough so that the reverse lip retractors could be placed on the right side of the disc and then on the left side the disc allowing adequate exposure of the disc for ALIF as dictated by Dr. Venetia Maxon.  The remainder of the procedure is as dictated by Dr. Venetia Maxon.  Waverly Ferrari, MD, FACS [facility] [place]  DATE OF OPERATION: 01/06/2011 DATE OF DICTATION: 01/06/2011

## 2011-01-06 NOTE — Consult Note (Signed)
[facility]  Patient name: Maria Johnson MRN: [id number] DOB: 1980-11-28 Sex: female  REASON FOR CONSULT: Evaluate for anterior retroperitoneal exposure of L5-S1. Consult from Dr.Stern.  HPI: Maria Johnson is a 30 y.o. female who developed the sudden onset of paresthesias in both lower extremities in February of 2011. She has had chronic back pain since that time. She continues to have paresthesias in her right leg. Her pain in her back is aggravated by standing and sitting.  She has tried physical therapy with minimal relief. His also had injection therapy which did not relieve her symptoms. She was noted to have degenerative disc disease at L5-S1 and vascular surgery was consult for exposure at this level.  Past Medical History  Diagnosis Date  . Pneumonia   . Bronchitis     history of  . Sleep apnea     has cpap  . Diabetes mellitus   . Headache     hx of migraines  . Acne     takes doxocycline daily for acne  . Anxiety     History reviewed. No pertinent family history.  SOCIAL HISTORY: History  Substance Use Topics  . Smoking status: Never Smoker   . Smokeless tobacco: Not on file  . Alcohol Use: Yes     occassional    No Known Allergies  Current Facility-Administered Medications  Medication Dose Route Frequency Provider Last Rate Last Dose  . ceFAZolin (ANCEF) IVPB 2 g/50 mL premix  2 g Intravenous 60 min Pre-Op Dorian Heckle, MD       Facility-Administered Medications Ordered in Other Encounters  Medication Dose Route Frequency Provider Last Rate Last Dose  . lactated ringers infusion    Continuous PRN Randel K Temples        REVIEW OF SYSTEMS: Arly.Keller ] denotes positive finding; [  ] denotes negative finding CARDIOVASCULAR:  [ ]  chest pain   [ ]  chest pressure   [ ]  palpitations   [ ]  orthopnea   [ ]  dyspnea on exertion   [ ]  claudication   [ ]  rest pain   [ ]  DVT   [ ]  phlebitis PULMONARY:   [ ]  productive cough   [ ]  asthma   [ ]   wheezing NEUROLOGIC:   [ ]  weakness  [ ]  paresthesias  [ ]  aphasia  [ ]  amaurosis  [ ]  dizziness HEMATOLOGIC:   [ ]  bleeding problems   [ ]  clotting disorders MUSCULOSKELETAL:  [ ]  joint pain   [ ]  joint swelling [ ]  leg swelling GASTROINTESTINAL: [ ]   blood in stool  [ ]   hematemesis GENITOURINARY:  [ ]   dysuria  [ ]   hematuria PSYCHIATRIC:  [ ]  history of major depression INTEGUMENTARY:  [ ]  rashes  [ ]  ulcers CONSTITUTIONAL:  [ ]  fever   [ ]  chills  PHYSICAL EXAM: Filed Vitals:   01/06/11 0619  BP: 114/76  Pulse: 91  Temp: 98.1 F (36.7 C)  TempSrc: Oral  Resp: 18  SpO2: 98%   Weight is 215 pounds. Height is 5 feet 10 inches. BMI = 30.9  There is no height or weight on file to calculate BMI. GENERAL: The patient is a well-nourished female, in no acute distress. The vital signs are documented above. CARDIOVASCULAR: There is a regular rate and rhythm without significant murmur appreciated. No carotid bruits. Palpable posterior tibial pulses bilaterally. Palpable left dorsalis pedis pulse. I cannot palpate a right dorsalis pedis pulse  the PULMONARY: There is good air exchange bilaterally without wheezing or rales. ABDOMEN: Soft and non-tender with normal pitched bowel sounds. She has significant obesity with a large pannus. MUSCULOSKELETAL: There are no major deformities or cyanosis. NEUROLOGIC: No focal weakness or paresthesias are detected. SKIN: There are no ulcers or rashes noted. PSYCHIATRIC: The patient has a normal affect.  DATA:  Lab Results  Component Value Date   WBC 4.7 12/29/2010   HGB 12.2 12/29/2010   HCT 37.3 12/29/2010   MCV 81.4 12/29/2010   PLT 373 12/29/2010   Lab Results  Component Value Date   NA 141 12/29/2010   K 4.5 12/29/2010   CL 105 12/29/2010   CO2 26 12/29/2010   Lab Results  Component Value Date   CREATININE 0.82 12/29/2010   CT scan of lumbar spine: Shows degenerative disc disease at L5-S1. There is mild neural foraminal narrowing on  the right at this level which could irritate the right L5 nerve root.   MEDICAL ISSUES: The patient appears to be a reasonable candidate for anterior retroperitoneal exposure of L5-S1. She is at increased risk for surgery because of her obesity. I have reviewed our role in exposure of the spine in order to allow anterior lumbar interbody fusion at the appropriate levels. We have discussed the potential complications of surgery, including but not limited to, arterial or venous injury, thrombosis, or bleeding. We have also discussed the potential risks of wound healing problems, the development of a hernia, nerve injury, leg swelling, or other unpredictable medical problems.  All the patient's questions were answered and they are agreeable to proceed.   Daylin Eads S [facility] [place] Beeper: [phone]

## 2011-01-06 NOTE — H&P (Signed)
NEUROSURGICAL HISTORY AND PHYSICAL   Maria Johnson   DOB:  06-01-1980      HISTORY:     Maria Johnson is a 30 year old woman who works at [employer] through [employer] who comes today for a second opinion regarding low back and right lower extremity pain.  She previously had a right L5-S1 discectomy by Dr. Wynetta Emery in 10/2009 and she says it helped her pain "only some". She says she is still having a lot of right leg pain involving her right buttock, all her toes, and also complains of some tingling into her second through fourth digits.  She has had previous injections which she said helped her on the left, but not on the right.  She has been out of work since 03/2009.  She has had a myelogram performed on 04/16/2010 and an MRI from 01/2010.  She had physical therapy after her discectomy in 2011. She has been taking Flexeril 10 mg. twice daily, Ibuprofen 800 mg., Vicodin and Tramadol, but says she is not taking those because they are not helpful. She says she does not take the Gabapentin because it has not helped her.  She currently describes burning into her right buttock and all her toes.    REVIEW OF SYSTEMS:   A detailed Review of Systems sheet was reviewed with the patient.  Pertinent positives include high cholesterol, leg pain while walking, change in bowel habits, leg weakness, back pain, leg pain, and diabetes.  All other systems are negative; this includes Constitutional symptoms, Eyes, Ears, nose, mouth, throat, Respiratory, Genitourinary, Integumentary & Breast, Neurologic, Psychiatric, Hematologic/Lymphatic, Allergic/Immunologic.    PAST MEDICAL HISTORY:      Current Medical Conditions:    She has a history of noninsulin dependent diabetes, elevated cholesterol.      Prior Operations and Hospitalizations:   A hysterectomy in 08/2007, tonsillectomy in 03/2005, left ankle surgery 05/2002, laparoscopic scar tissue removal 02/2003.      Medications and Allergies:  Medications - Metformin ER 500 mg.  b.i.d., Pravastatin 20 mg. q.h.s., Vitamin D 50,000 qweek, Xanax 1 mg. prn, Doxycycline 100 mg. qd, Cyclobenzaprine 10 mg. t.i.d., Ibuprofen 800 mg. prn, Vicodin 500 mg. prn, and Tramadol 50 mg. before surgery.  No known drug allergies.      Height and Weight:     She is 5'10" tall, 214 lbs.   FAMILY HISTORY:    Her mother is 31 in good health with high blood pressure and anemia.  Her father is 82 in good health with anxiety.    SOCIAL HISTORY:    She denies tobacco, alcohol, or drug use.    DIAGNOSTIC STUDIES:   I reviewed an MRI and CT myelogram of the lumbar spine along with EMG and nerve conduction velocity testing.  The imaging studies demonstrate on the MRI that she has disc degeneration at L5-S1 with a rightward disc bulge and herniation displacing the right S1 nerve root.  Additionally, a myelogram demonstrates that she has some foraminal stenosis at L5-S1 which may be irritating the right L5 nerve root.    PHYSICAL EXAMINATION:      General Appearance:   On examination today, Maria Johnson is a pleasant and cooperative woman in no acute distress.      Blood Pressure, Pulse:     Her blood pressure is 122/64.  Heart rate is 74 and regular.  Respiratory rate is 18.      HEENT - normocephalic, atraumatic.  The pupils are equal, round and reactive to  light.  The extraocular muscles are intact.  Sclerae - white.  Conjunctiva - pink.  Oropharynx benign.  Uvula midline.     Neck - there are no masses, meningismus, deformities, tracheal deviation, jugular vein distention or carotid bruits.  There is normal cervical range of motion.  Spurlings' test is negative without reproducible radicular pain turning the patient's head to either side.  Lhermitte's sign is not present with axial compression.      Respiratory - there is normal respiratory effort with good intercostal function.  Lungs are clear to auscultation.  There are no rales, rhonchi or wheezes.      Cardiovascular - the heart has regular  rate and rhythm to auscultation.  No murmurs are appreciated.  There is no extremity edema, cyanosis or clubbing.  There are palpable pedal pulses.      Abdomen - soft, nontender, no hepatosplenomegaly appreciated or masses.  There are active bowel sounds.  No guarding or rebound.      Musculoskeletal Examination - she has right greater than left sciatic notch discomfort to palpation and has a healed midline lumbar incision. She is able to stand on her heels and toes, and able to bend to touch her toes.  She does have a positive Tinel's sign at the right wrist and a positive Phalen's sign on the right; both suggestive of carpal tunnel syndrome.  She has a positive straight leg raise at 45 degrees on the right and negative Patrick's test.  Negative straight leg raise on the left.     NEUROLOGICAL EXAMINATION: The patient is oriented to time, person and place and has good recall of both recent and remote memory with normal attention span and concentration.  The patient speaks with clear and fluent speech and exhibits normal language function and appropriate fund of knowledge.      Cranial Nerve Examination - pupils are equal, round and reactive to light.  Extraocular movements are full.  Visual fields are full to confrontational testing.  Facial sensation and facial movement are symmetric and intact.  Hearing is intact to finger rub.  Palate is upgoing.  Shoulder shrug is symmetric.  Tongue protrudes in the midline.      Motor Examination - motor strength is 5/5 in the bilateral deltoids, biceps, triceps, handgrips, wrist extensors, interosseous.  In the lower extremities motor strength is 5/5 in hip flexion, extension, quadriceps, hamstrings, plantar flexion, dorsiflexion, 5/5 left extensor hallucis longus, and right extensor hallucis longus strength at 4+/5.      Sensory Examination - she has deceased pin sensation in a right L5 distribution.       Deep Tendon Reflexes - 2 in the biceps, triceps, and  brachioradialis, 2 in the knees, 2 in the ankles.  The great toes are downgoing to plantar stimulation.      Cerebellar Examination - normal coordination in upper and lower extremities and normal rapid alternating movements.  Romberg test is negative.    IMPRESSION AND RECOMMENDATIONS: Maria Johnson is a 30 year old woman with low back pain and right lumbar radiculopathy, both suggestive of L5 and S1 nerve root irritation. She has disc degeneration at L5-S1. She did not improve a great deal following a microdiscectomy.  Imaging studies demonstrate persistent nerve root irritation and she has had a prolonged trial of conservative management. I agree with Dr. Lonie Peak previously stated recommendations of undergoing an anterior lumbar interbody fusion at the L5-S1 level.  She says that she is quite miserable and wants to go ahead  with surgery.  She says that she is frustrated with Dr. Lonie Peak office and what she perceives as his office's lack of responsiveness and says that she would like to transfer her care.  I have recommended that she get a new MRI of her lumbar spine prior to surgery so that we can better clarify the state of this disc and make sure there is no other structural pathology at any other level.  I will plan on doing so and we will then proceed with anterior lumbar decompression and fusion at the L5-S1 level.  We had a lengthy discussion about risks and benefits of surgery and surgical approaches, and she wishes to proceed.  This will be done in conjunction with a vascular surgeon to perform approach.  We discussed the risks and benefits. I showed her models and answered her questions.    [facility]    Danae Orleans. Venetia Maxon, M.D.    JDS:aft

## 2011-01-07 LAB — GLUCOSE, CAPILLARY: Glucose-Capillary: 110 mg/dL — ABNORMAL HIGH (ref 70–99)

## 2011-01-07 MED ORDER — SODIUM CHLORIDE 0.9 % IV BOLUS (SEPSIS)
500.0000 mL | Freq: Once | INTRAVENOUS | Status: AC
  Administered 2011-01-07: 500 mL via INTRAVENOUS

## 2011-01-07 NOTE — Progress Notes (Signed)
Subjective: Patient reports "I'm doing ok. I just get dizzy when I'm up."  Objective: Vital signs in last 24 hours: Temp:  [97.9 F (36.6 C)-99.2 F (37.3 C)] 98.6 F (37 C) (12/07 1013) Pulse Rate:  [64-123] 101  (12/07 1013) Resp:  [14-23] 17  (12/07 1013) BP: (77-126)/(40-78) 94/55 mmHg (12/07 1013) SpO2:  [91 %-100 %] 100 % (12/07 1013)  Intake/Output from previous day: 12/06 0701 - 12/07 0700 In: 3550 [I.V.:3050; IV Piggyback:500] Out: 2550 [Urine:2450; Blood:100] Intake/Output this shift:    Alert, conversant. Denies lumbar and BLE pain. Abdominal pain well-controlled. Abd soft, nontender. incision w/o erythema, swelliing, or drainage. Dermabond intact.  Lab Results: No results found for this basename: WBC:2,HGB:2,HCT:2,PLT:2 in the last 72 hours BMET No results found for this basename: NA:2,K:2,CL:2,CO2:2,GLUCOSE:2,BUN:2,CREATININE:2,CALCIUM:2 in the last 72 hours  Studies/Results: Dg Lumbar Spine 2-3 Views  01/06/2011  *RADIOLOGY REPORT*  Clinical Data: Anterior fusion L5-S1  LUMBAR SPINE - 2-3 VIEW  Comparison: MRI 12/09/2010  Findings: Anterior plate and screws at L5-S1 in good position. Interbody spacer at L5-S1 in good position.  IMPRESSION: Satisfactory ALIF  L5-S1.  Original Report Authenticated By: Camelia Phenes, M.D.   Dg C-arm Gt 120 Min  01/06/2011  CLINICAL DATA: L5-S1 ALIF   C-ARM GT 120 MIN  Fluoroscopy was utilized by the requesting physician.  No radiographic  interpretation.     Dg Or Local Abdomen  01/06/2011  *RADIOLOGY REPORT*  Clinical Data: Anterior fusion at L5-S1, evaluate for retained foreign body  OR LOCAL ABDOMEN  Comparison: Lumbar spine films of 11/29/2010  Findings: A portable supine view shows anterior metallic fusion device at the Z6-X0 level.  To surgical staples are noted in the pelvis.  No retained foreign body is seen.  IMPRESSION: Anterior fusion at L5-S1.  No retained foreign body.  Original Report Authenticated By: Juline Patch, M.D.     Assessment/Plan: Episodes of hypotension with substernal chest pain this am when attempting OOB & sitting. CP short lived per pt & relieved by liquids po. Pt has been flat in bed since PT visited and BP remains in the 90's systolic(and pt asymptomatic supine).  LOS: 1 day  Will discuss intervention vs. rest & slow progression with Dr. Venetia Maxon.    Georgiann Cocker 01/07/2011, 11:09 AM     Chest pain associated with gas.  Will reassess hypotension after fluid bolus.  Doing well from surgery otherwise.

## 2011-01-07 NOTE — Progress Notes (Addendum)
Physical Therapy Evaluation Patient Details Name: Maria Johnson MRN: [id number] DOB: 1980/03/25 Today's Date: 01/07/2011  Problem List: There is no problem list on file for this patient.   Past Medical History:  Past Medical History  Diagnosis Date  . Bronchitis     history of  . Sleep apnea     has cpap  . Diabetes mellitus   . Acne     takes doxocycline daily for acne  . Anxiety   . Carpal tunnel syndrome on both sides   . Pneumonia 1990's  . Sickle cell trait   . Headache     hx of migraines   Past Surgical History:  Past Surgical History  Procedure Date  . Back surgery     Laminectomy Sept 14, 2011,  . Ankle surgery 05/2003    torn cartilage  repair; left  . Lumbar fusion 01/06/11    L5-S1  . Diagnostic laparoscopy 01/2002    removed scar tissue  . Tonsillectomy 03/2005  . Vaginal hysterectomy 08/2007    partial   . Laminectomy and microdiscectomy lumbar spine 10/2009    L4, L5, S1    PT Assessment/Plan/Recommendation PT Assessment Clinical Impression Statement: Patient limited post ALIF secondary to nausea, diaphoresis, and decrease in BP with sitting. Anticipate good progress over time. Patient needs skilled PT to maximize functional abilities to regain independence and promote optimal healing through education. PT Recommendation/Assessment: Patient will need skilled PT in the acute care venue PT Problem List: Decreased knowledge of use of DME;Decreased knowledge of precautions;Pain;Cardiopulmonary status limiting activity;Decreased activity tolerance;Decreased mobility PT Therapy Diagnosis : Acute pain;Difficulty walking PT Plan PT Frequency: Min 5X/week PT Treatment/Interventions: DME instruction;Gait training;Stair training;Functional mobility training;Therapeutic activities;Patient/family education PT Recommendation Follow Up Recommendations: None Equipment Recommended: Rolling walker with 5" wheels;3 in 1 bedside comode PT Goals  Acute Rehab PT Goals PT  Goal Formulation: With patient Time For Goal Achievement: 7 days Pt will Roll Supine to Left Side: with modified independence PT Goal: Rolling Supine to Left Side - Progress: Other (comment) Pt will go Supine/Side to Sit: with modified independence PT Goal: Supine/Side to Sit - Progress: Other (comment) Pt will go Sit to Supine/Side: with modified independence PT Goal: Sit to Supine/Side - Progress: Other (comment) Pt will go Sit to Stand: with modified independence PT Goal: Sit to Stand - Progress: Other (comment) Pt will go Stand to Sit: with modified independence PT Goal: Stand to Sit - Progress: Other (comment) Pt will Transfer Bed to Chair/Chair to Bed: with modified independence PT Transfer Goal: Bed to Chair/Chair to Bed - Progress: Other (comment) Pt will Ambulate: >150 feet;with modified independence PT Goal: Ambulate - Progress: Other (comment) Pt will Go Up / Down Stairs: 3-5 stairs;with rail(s);with supervision PT Goal: Up/Down Stairs - Progress: Other (comment) Additional Goals Additional Goal #1: Patient will recall back precautions and demonstrate adherence to in functional activity  PT Evaluation Precautions/Restrictions  Precautions Precautions: Back Precaution Booklet Issued: Yes (comment) Precaution Comments: Educated in back precautions, posture, and body mechanics Required Braces or Orthoses: Yes Spinal Brace: Lumbar corset;Applied in sitting position Prior Functioning  Home Living Lives With: Family (Going to mothers at discharge) Receives Help From: Family Type of Home: House Home Layout: One level Home Access: Stairs to enter Entrance Stairs-Rails: Left Entrance Stairs-Number of Steps: 5 Bathroom Toilet: Standard Bathroom Accessibility: Yes How Accessible: Accessible via walker Home Adaptive Equipment: None Prior Function Level of Independence: Independent with basic ADLs;Independent with homemaking with ambulation Driving: Yes Vocation:  Full time  employment Comments: Until back pain issues Cognition Cognition Arousal/Alertness: Awake/alert Overall Cognitive Status: Appears within functional limits for tasks assessed Orientation Level: Oriented X4 Sensation/Coordination Sensation Light Touch: Appears Intact Coordination Gross Motor Movements are Fluid and Coordinated: Yes Extremity Assessment RLE Assessment RLE Assessment: Within Functional Limits LLE Assessment LLE Assessment: Within Functional Limits Mobility (including Balance) Bed Mobility Rolling Left: 4: Min assist Rolling Left Details (indicate cue type and reason): With cues on log roll technique Left Sidelying to Sit: 4: Min assist Left Sidelying to Sit Details (indicate cue type and reason): To ensure correct sequencing of loer extremities and trunk Sitting - Scoot to Edge of Bed: 5: Supervision Sit to Supine - Left: 4: Min assist Sit to Supine - Left Details (indicate cue type and reason): For correct sequencing of lower extremities and trunk    End of Session PT - End of Session Equipment Utilized During Treatment: Back brace Activity Tolerance: Treatment limited secondary to medical complications (Comment) (Diaphoretic, nausea, and decreased BP.) BP 77/40 Patient left: in bed Nurse Communication:  (Medical condition) General Behavior During Session: [facility] for tasks performed Cognition: [facility] for tasks performed  Edwyna Perfect, PT  Pager [phone]  01/07/2011, 9:21 AM  Called and left message at Dr. Rush Farmer office as patient did state she also had chest pain with attempts at OOB with nursing earlier this am. Coupled with the symptoms patient experienced with PT I felt it was important to alert MD.  01/07/2011 Edwyna Perfect, PT  Pager [phone]

## 2011-01-07 NOTE — Progress Notes (Addendum)
Patient ID: Maria Johnson, female   DOB: 1980/04/06, 30 y.o.   MRN: [id number] [facility] [place]  Progress Note Bypass Surgery  Date of Surgery: 01/06/2011 Procedure: Procedure(s): ANTERIOR LUMBAR FUSION 1 LEVEL ABDOMINAL EXPOSURE Surgeon: Surgeon(s): Dorian Heckle, MD Chuck Hint, MD POD : 1 Day Post-Op  History of Present Illness  Maria Johnson is a 30 y.o. female who is S/P  ANTERIOR LUMBAR FUSION 1 LEVEL ABDOMINAL EXPOSURE surgery.  The patient's wounds are healing well.   Patients pain is well controlled. She states BLE have good sensation. C/O some burning pain in right thigh.  Significant Diagnostic Studies: CBC    Component Value Date/Time   WBC 4.7 12/29/2010 0935   RBC 4.58 12/29/2010 0935   HGB 12.2 12/29/2010 0935   HCT 37.3 12/29/2010 0935   PLT 373 12/29/2010 0935   MCV 81.4 12/29/2010 0935   MCH 26.6 12/29/2010 0935   MCHC 32.7 12/29/2010 0935   RDW 13.3 12/29/2010 0935   LYMPHSABS 2.3 03/02/2010 2205   MONOABS 0.4 03/02/2010 2205   EOSABS 0.1 03/02/2010 2205   BASOSABS 0.0 03/02/2010 2205    BMET    Component Value Date/Time   NA 141 12/29/2010 0935   K 4.5 12/29/2010 0935   CL 105 12/29/2010 0935   CO2 26 12/29/2010 0935   GLUCOSE 92 12/29/2010 0935   BUN 7 12/29/2010 0935   CREATININE 0.82 12/29/2010 0935   CALCIUM 9.8 12/29/2010 0935   GFRNONAA >90 12/29/2010 0935   GFRAA >90 12/29/2010 0935    COAG No results found for this basename: INR, PROTIME   No results found for this basename: PTT    Physical Examination  BP Readings from Last 3 Encounters:  01/07/11 126/73  01/07/11 126/73  12/29/10 120/87   Temp Readings from Last 3 Encounters:  01/07/11 98.4 F (36.9 C)   01/07/11 98.4 F (36.9 C)   12/29/10 97.4 F (36.3 C) Oral   SpO2 Readings from Last 3 Encounters:  01/07/11 99%  01/07/11 99%  12/29/10 99%      Pt is A&O x 3 LLQ Incision/s is/are clean,dry.intact, and   healing without hematoma, erythema or drainage BLE is warm; with good color  Dorsalis Pedis pulse is palpable on left Posterior tibial pulse is  Palpable Bilaterally  Assessment: Pt. Doing well Post-op pain is controlled Wounds are healing well  extremities are well perfused  Plan: PT/OT for ambulation Will F/U as needed  Maria Johnson [phone] 01/07/2011 8:54 AM        Agree with above.  Please call with any questions

## 2011-01-07 NOTE — Progress Notes (Signed)
Patient ID: Maria Johnson, female   DOB: [birth year], 30 y.o.   MRN: [id number] Alert. No obvious distress. HOB at 45 degrees now w/o dizziness. Systolic up to 116 since two 500cc NS boluses. HR remains 110. Has not sat up fully yet.  Incisional & right buttock discomfort without change, but controlled with meds. T 101.  Incentive spirometer ordered by Dr. Venetia Maxon. BM's x3 this afternoon.

## 2011-01-07 NOTE — Progress Notes (Signed)
CSW met with pt to address consult for possible SNF. For assessment, please see pt's chart. Currently, PT is recommending pt to discharge home with DME. Pt does not have SNF needs at this time. CSW signing off as no further clinical social work needs identified at this time. Please reconsult if a need arises prior to discharge.  Dede Query, MSW, Theresia Majors [phone]

## 2011-01-08 LAB — GLUCOSE, CAPILLARY
Glucose-Capillary: 111 mg/dL — ABNORMAL HIGH (ref 70–99)
Glucose-Capillary: 94 mg/dL (ref 70–99)

## 2011-01-08 NOTE — Progress Notes (Signed)
Subjective: Patient reports Complains of abdominal pain moderate back pain. Tolerating oral medication. Ambulating modestly.  Objective: Vital signs in last 24 hours: Temp:  [98.2 F (36.8 C)-100.4 F (38 C)] 99 F (37.2 C) (12/08 0600) Pulse Rate:  [99-119] 112  (12/08 0600) Resp:  [16-18] 16  (12/08 0600) BP: (94-120)/(54-77) 120/77 mmHg (12/08 0600) SpO2:  [95 %-100 %] 95 % (12/08 0600)  Intake/Output from previous day: 12/07 0701 - 12/08 0700 In: 600 [I.V.:600] Out: -  Intake/Output this shift:    Incision is clean and dry motor function good in lower extremities  Lab Results: No results found for this basename: WBC:2,HGB:2,HCT:2,PLT:2 in the last 72 hours BMET No results found for this basename: NA:2,K:2,CL:2,CO2:2,GLUCOSE:2,BUN:2,CREATININE:2,CALCIUM:2 in the last 72 hours  Studies/Results: Dg Lumbar Spine 2-3 Views  01/06/2011  *RADIOLOGY REPORT*  Clinical Data: Anterior fusion L5-S1  LUMBAR SPINE - 2-3 VIEW  Comparison: MRI 12/09/2010  Findings: Anterior plate and screws at L5-S1 in good position. Interbody spacer at L5-S1 in good position.  IMPRESSION: Satisfactory ALIF  L5-S1.  Original Report Authenticated By: Camelia Phenes, M.D.   Dg C-arm Gt 120 Min  01/06/2011  CLINICAL DATA: L5-S1 ALIF   C-ARM GT 120 MIN  Fluoroscopy was utilized by the requesting physician.  No radiographic  interpretation.     Dg Or Local Abdomen  01/06/2011  *RADIOLOGY REPORT*  Clinical Data: Anterior fusion at L5-S1, evaluate for retained foreign body  OR LOCAL ABDOMEN  Comparison: Lumbar spine films of 11/29/2010  Findings: A portable supine view shows anterior metallic fusion device at the Z6-X0 level.  To surgical staples are noted in the pelvis.  No retained foreign body is seen.  IMPRESSION: Anterior fusion at L5-S1.  No retained foreign body.  Original Report Authenticated By: Juline Patch, M.D.    Assessment/Plan: Stable postop day 2.  LOS: 2 days  Discontinue IV discontinue PCA  medications, encourage ambulation   Donnelle Olmeda J 01/08/2011, 9:41 AM

## 2011-01-08 NOTE — Progress Notes (Signed)
Physical Therapy Treatment Patient Details Name: Maria Johnson MRN: [id number] DOB: [birth year] Today's Date: 01/08/2011  PT Assessment/Plan  PT - Assessment/Plan PT Plan: Discharge plan remains appropriate Follow Up Recommendations: Home health PT Equipment Recommended: Rolling walker with 5" wheels;3 in 1 bedside comode PT Goals  Acute Rehab PT Goals PT Goal Formulation: With patient PT Goal: Rolling Supine to Left Side - Progress: Progressing toward goal PT Goal: Supine/Side to Sit - Progress: Progressing toward goal PT Goal: Sit to Supine/Side - Progress: Met PT Goal: Sit to Stand - Progress: Progressing toward goal PT Goal: Stand to Sit - Progress: Progressing toward goal PT Transfer Goal: Bed to Chair/Chair to Bed - Progress: Progressing toward goal PT Goal: Ambulate - Progress: Progressing toward goal PT Goal: Up/Down Stairs - Progress: Progressing toward goal Additional Goals Additional Goal #1: Patient will recall back precautions and demonstrate adherence to in functional activity PT Goal: Additional Goal #1 - Progress: Progressing toward goal  PT Treatment Precautions/Restrictions  Precautions Precautions: Back Precaution Booklet Issued: Yes (comment) Precaution Comments: Issued "No Bending, No Arching, No Twisting" Handout Required Braces or Orthoses: Yes Spinal Brace: Lumbar corset;Applied in sitting position Restrictions Weight Bearing Restrictions: No Mobility (including Balance) Bed Mobility Bed Mobility: Yes Rolling Left: 4: Min assist;With rail Rolling Left Details (indicate cue type and reason): vc for normal movement/technique, A with roll Left Sidelying to Sit: 3: Mod assist Left Sidelying to Sit Details (indicate cue type and reason): vc for technique; manual A for truncal A Sitting - Scoot to Edge of Bed: 5: Supervision Sitting - Scoot to [place] of Bed Details (indicate cue type and reason): S-Min Guard assist w/ increased time Sit to Supine - Left: 3:  Mod assist Sit to Supine - Left Details (indicate cue type and reason): Mod assist to lift/lower LE's w/ VC's for safe technique Transfers Transfers: Yes Sit to Stand: 4: Min assist;From bed;With upper extremity assist Sit to Stand Details (indicate cue type and reason): vc for hand placement; manual A to come forward Stand to Sit: 4: Min assist Stand to Sit Details: Min guard assist w/ increased time Ambulation/Gait Ambulation/Gait: Yes Ambulation/Gait Assistance: Other (comment) (min guard A) Ambulation Distance (Feet): 200 Feet Assistive device: Rolling walker Gait Pattern: Step-through pattern;Decreased stride length Stairs: Yes Stairs Assistance: Other (comment) (MIN GUARD A) Stair Management Technique: One rail Right;Alternating pattern;Step to pattern Number of Stairs: 4   Posture/Postural Control Posture/Postural Control: No significant limitations Exercise    End of Session PT - End of Session Activity Tolerance: Patient tolerated treatment well Patient left: in bed General Behavior During Session: [facility] for tasks performed Cognition: [facility] for tasks performed  Frankee Gritz, Eliseo Gum 01/08/2011, 1:59 PM  01/08/2011  [place] Bing, PT [phone] (pager)

## 2011-01-08 NOTE — Progress Notes (Signed)
Occupational Therapy Evaluation Patient Details Name: Maria Johnson MRN: [id number] DOB: Jun 04, 1980 Today's Date: 01/08/2011 Time: 10:15-10:38am Ev II; 2 Indirect Care  Problem List: There is no problem list on file for this patient.   Past Medical History:  Past Medical History  Diagnosis Date  . Bronchitis     history of  . Sleep apnea     has cpap  . Diabetes mellitus   . Acne     takes doxocycline daily for acne  . Anxiety   . Carpal tunnel syndrome on both sides   . Pneumonia 1990's  . Sickle cell trait   . Headache     hx of migraines   Past Surgical History:  Past Surgical History  Procedure Date  . Back surgery     Laminectomy Sept 14, 2011,  . Ankle surgery 05/2003    torn cartilage  repair; left  . Lumbar fusion 01/06/11    L5-S1  . Diagnostic laparoscopy 01/2002    removed scar tissue  . Tonsillectomy 03/2005  . Vaginal hysterectomy 08/2007    partial   . Laminectomy and microdiscectomy lumbar spine 10/2009    L4, L5, S1    OT Assessment/Plan/Recommendation OT Assessment Clinical Impression Statement: Pt currently requiring Max assist LB dress/bathe, would benefit from A/E instruction & pt education. Requires increased time for tasks and to don/doff brace noted. Rec acute OT followed by [facility] & PRN assist from family at d/c. Pt will also need 3:1 if she goes home.  OT Recommendation/Assessment: Patient will need skilled OT in the acute care venue OT Problem List: Decreased activity tolerance;Decreased knowledge of use of DME or AE;Decreased knowledge of precautions;Pain Barriers to Discharge Comments: Pt reports she will have assist from family and boyfriend  at d/c therefore ? HHOT vs SNF Rehab depending on pt progress acutely. OT Therapy Diagnosis : Acute pain;Other (comment);Generalized weakness (Monitor BP) OT Plan OT Frequency: Min 2X/week OT Treatment/Interventions: Self-care/ADL training;Therapeutic activities;DME and/or AE instruction;Patient/family  education OT Recommendation Follow Up Recommendations: Home health OT;Skilled nursing facility Equipment Recommended: 3 in 1 bedside comode Individuals Consulted Consulted and Agree with Results and Recommendations: Patient OT Goals Acute Rehab OT Goals OT Goal Formulation: With patient ADL Goals Pt Will Perform Grooming: with modified independence;Standing at sink ADL Goal: Grooming - Progress: Progressing toward goals Pt Will Perform Upper Body Dressing: with modified independence;Sitting, chair;Sitting, bed ADL Goal: Upper Body Dressing - Progress: Progressing toward goals Pt Will Perform Lower Body Dressing: with adaptive equipment;Unsupported;Sitting, chair;Sitting, bed;with supervision;with min assist ADL Goal: Lower Body Dressing - Progress: Progressing toward goals Pt Will Perform Tub/Shower Transfer: Tub transfer;with min assist;with supervision;with DME;Transfer tub bench;Maintaining back safety precautions ADL Goal: Tub/Shower Transfer - Progress: Progressing toward goals ADL Goal: Additional Goal #1 - Progress:  (Pt will be Mod I toilet transfer,hygeine & clothing neg )  OT Evaluation Precautions/Restrictions  Precautions Precautions: Back Precaution Booklet Issued: Yes (comment) Precaution Comments: Issued "No Bending, No Arching, No Twisting" Handout Required Braces or Orthoses: Yes Spinal Brace: Applied in sitting position Restrictions Weight Bearing Restrictions: No Prior Functioning Home Living Lives With: Family (Going to mothers at discharge) Receives Help From: Family Type of Home: House Home Layout: One level Home Access: Stairs to enter Entrance Stairs-Rails: Left Entrance Stairs-Number of Steps: 5 Bathroom Toilet: Standard Bathroom Accessibility: Yes How Accessible: Accessible via walker Home Adaptive Equipment: None Prior Function Level of Independence: Independent with basic ADLs;Independent with homemaking with ambulation Driving: Yes Vocation:  Full time employment ADL  ADL Grooming: Wash/dry hands;Simulated;Wash/dry face;Modified independent Where Assessed - Grooming: Sitting, bed Upper Body Bathing: Simulated;Supervision/safety Upper Body Bathing Details (indicate cue type and reason): VC's for safety and positioning secondary to back precautions Where Assessed - Upper Body Bathing: Sitting, bed;Unsupported Lower Body Bathing: Simulated;Maximal assistance Where Assessed - Lower Body Bathing: Sitting, bed;Unsupported Lower Body Dressing: Performed;Maximal assistance Lower Body Dressing Details (indicate cue type and reason): Don/doff socks @ EOB Where Assessed - Lower Body Dressing: Sitting, bed;Unsupported Toilet Transfer: Performed;Minimal assistance Toilet Transfer Details (indicate cue type and reason): Pt takes increased time and moves slowly. Toilet Transfer Method: Surveyor, minerals: Programme researcher, broadcasting/film/video Manipulation: Performed;Supervision/safety Where Assessed - Glass blower/designer Manipulation: Standing Toileting - Hygiene: Performed;Minimal assistance Where Assessed - Toileting Hygiene: Standing;Sit to stand from 3-in-1 or toilet Equipment Used: Other (comment) (Pt declined RW use; used 3:1 bedside) ADL Comments: Pt currently requiring Max assist LB dress/bathe, would benefit from A/E instruction & pt education. Requires increased time for tasks and to don/doff brace noted. Rec acute OT followed by HHOT & PRN assist from family Vision/Perception  Vision - History Baseline Vision: No visual deficits Patient Visual Report: No change from baseline Cognition Cognition Arousal/Alertness: Lethargic Overall Cognitive Status: Appears within functional limits for tasks assessed Orientation Level: Oriented X4 Sensation/Coordination Sensation Light Touch: Appears Intact Coordination Gross Motor Movements are Fluid and Coordinated: Yes Fine Motor Movements are Fluid and Coordinated:  Yes Extremity Assessment RUE Assessment RUE Assessment: Within Functional Limits LUE Assessment LUE Assessment: Within Functional Limits Mobility  Bed Mobility Bed Mobility: Yes Rolling Left: 4: Min assist;With rail Rolling Left Details (indicate cue type and reason): Increased time w/ Cues for technique and hand placement Left Sidelying to Sit: 3: Mod assist Left Sidelying to Sit Details (indicate cue type and reason): Increased time w/ cues for technique and hand placement Sitting - Scoot to Edge of Bed: 5: Supervision;4: Min assist Sitting - Scoot to [place] of Bed Details (indicate cue type and reason): S-Min Guard assist w/ increased time Sit to Supine - Left: 3: Mod assist Sit to Supine - Left Details (indicate cue type and reason): Mod assist to lift/lower LE's w/ VC's for safe technique Transfers Transfers: Yes Sit to Stand: 4: Min assist;From bed;With armrests;With upper extremity assist;From chair/3-in-1 Sit to Stand Details (indicate cue type and reason): Increased time and VC's Stand to Sit: 4: Min assist;To bed;To chair/3-in-1;Without upper extremity assist Stand to Sit Details: Min guard assist w/ increased time   End of Session OT - End of Session Equipment Utilized During Treatment: Gait belt;Back brace;Other (comment) (3:1, pt declined RW use to 3:1 noted) Activity Tolerance: Patient limited by pain;Patient limited by fatigue Patient left: in bed;with call bell in reach General Behavior During Session: Other (comment) (lethargic) Cognition: [facility] for tasks performed   Alm Bustard 01/08/2011, 11:52 AM

## 2011-01-09 LAB — GLUCOSE, CAPILLARY
Glucose-Capillary: 95 mg/dL (ref 70–99)
Glucose-Capillary: 90 mg/dL (ref 70–99)
Glucose-Capillary: 89 mg/dL (ref 70–99)

## 2011-01-09 MED ORDER — MAGIC MOUTHWASH
5.0000 mL | Freq: Three times a day (TID) | ORAL | Status: DC
  Administered 2011-01-09 – 2011-01-10 (×4): 5 mL via ORAL
  Filled 2011-01-09 (×6): qty 5

## 2011-01-09 MED ORDER — PANTOPRAZOLE SODIUM 40 MG PO TBEC
40.0000 mg | DELAYED_RELEASE_TABLET | Freq: Every day | ORAL | Status: DC
  Administered 2011-01-09 – 2011-01-10 (×2): 40 mg via ORAL
  Filled 2011-01-09 (×2): qty 1

## 2011-01-09 MED ORDER — DOXYCYCLINE HYCLATE 100 MG PO TABS
100.0000 mg | ORAL_TABLET | Freq: Every day | ORAL | Status: DC
  Administered 2011-01-09: 100 mg via ORAL
  Filled 2011-01-09 (×2): qty 1

## 2011-01-09 NOTE — Progress Notes (Signed)
Physical Therapy Treatment Patient Details Name: Maria Johnson MRN: [id number] DOB: 1980-08-08 Today's Date: 01/09/2011  PT Assessment/Plan  PT - Assessment/Plan PT Plan: Discharge plan remains appropriate PT Frequency: Min 5X/week Follow Up Recommendations: Home health PT Equipment Recommended: Rolling walker with 5" wheels;3 in 1 bedside comode PT Goals  Acute Rehab PT Goals PT Goal: Rolling Supine to Left Side - Progress: Progressing toward goal PT Goal: Supine/Side to Sit - Progress: Progressing toward goal PT Goal: Sit to Supine/Side - Progress: Met PT Goal: Sit to Stand - Progress: Met PT Goal: Stand to Sit - Progress: Met PT Transfer Goal: Bed to Chair/Chair to Bed - Progress: Not met PT Goal: Ambulate - Progress: Progressing toward goal PT Goal: Up/Down Stairs - Progress: Met Additional Goals PT Goal: Additional Goal #1 - Progress: Progressing toward goal  PT Treatment Precautions/Restrictions  Precautions Precautions: Back Precaution Booklet Issued: No Precaution Comments: pt able to recall 3/3 back precautions Required Braces or Orthoses: Yes Spinal Brace: Lumbar corset;Applied in sitting position Restrictions Weight Bearing Restrictions: No Mobility (including Balance) Bed Mobility Bed Mobility: Yes Rolling Right: 5: Supervision Rolling Right Details (indicate cue type and reason): Marland Kitchen Rolling Left: 5: Supervision Rolling Left Details (indicate cue type and reason): head of bed down and no rails. Left Sidelying to Sit: 5: Supervision;HOB flat Sit to Supine - Left: HOB flat;5: Supervision Transfers Sit to Stand: From chair/3-in-1;With upper extremity assist;6: Modified independent (Device/Increase time) Sit to Stand Details (indicate cue type and reason): cues for safe hand placement Stand to Sit: To chair/3-in-1;With upper extremity assist;6: Modified independent (Device/Increase time) Stand to Sit Details: cues for safe hand placement with  transfers Ambulation/Gait Ambulation/Gait: Yes Ambulation/Gait Assistance: 5: Supervision Ambulation Distance (Feet): 250 Feet Assistive device: Rolling walker Gait Pattern: Step-through pattern;Decreased stride length Stairs: Yes Stairs Assistance: 5: Supervision Stair Management Technique: One rail Right;Alternating pattern;Step to pattern;Forwards (up with alternating pattern, down with step to pattern) Number of Stairs: 5   Posture/Postural Control Posture/Postural Control: No significant limitations   End of Session PT - End of Session Equipment Utilized During Treatment: Gait belt;Back brace (pt supervision to don/doff brace at edge of bed) Activity Tolerance: Patient tolerated treatment well Patient left: in chair;with call bell in reach Nurse Communication: Mobility status for transfers;Mobility status for ambulation General Behavior During Session: [facility] for tasks performed Cognition: [facility] for tasks performed  Sallyanne Kuster 01/09/2011, 2:05 PM  Sallyanne Kuster, PTA Office- [phone]

## 2011-01-09 NOTE — Progress Notes (Signed)
Patient ID: Maria Johnson, female   DOB: [birth year], 30 y.o.   MRN: [id number] Subjective: Patient reports Slowly increasing activity. + BM  Objective: Vital signs in last 24 hours: Temp:  [97.9 F (36.6 C)-100.2 F (37.9 C)] 100 F (37.8 C) (12/09 0945) Pulse Rate:  [110-116] 115  (12/09 0945) Resp:  [16-20] 20  (12/09 0945) BP: (110-128)/(71-78) 120/78 mmHg (12/09 0945) SpO2:  [95 %-99 %] 99 % (12/09 0945)  Intake/Output from previous day:   Intake/Output this shift:    Wound clean and dry  Lab Results: No results found for this basename: WBC:2,HGB:2,HCT:2,PLT:2 in the last 72 hours BMET No results found for this basename: NA:2,K:2,CL:2,CO2:2,GLUCOSE:2,BUN:2,CREATININE:2,CALCIUM:2 in the last 72 hours  Studies/Results: No results found.  Assessment/Plan: Doing well. Slow improvement. Will increase activity as tolerated   LOS: 3 days  As above   Reinaldo Meeker, MD 01/09/2011, 9:48 AM

## 2011-01-10 LAB — GLUCOSE, CAPILLARY
Glucose-Capillary: 123 mg/dL — ABNORMAL HIGH (ref 70–99)
Glucose-Capillary: 88 mg/dL (ref 70–99)

## 2011-01-10 NOTE — Progress Notes (Signed)
Patient ID: PENI RUPARD, female   DOB: 1980/10/17, 30 y.o.   MRN: [id number] Subjective: Patient reports imporving  Objective: Vital signs in last 24 hours: Temp:  [98.1 F (36.7 C)-100 F (37.8 C)] 98.5 F (36.9 C) (12/10 0704) Pulse Rate:  [98-115] 110  (12/10 0704) Resp:  [16-20] 18  (12/10 0704) BP: (101-131)/(66-87) 131/71 mmHg (12/10 0704) SpO2:  [96 %-100 %] 96 % (12/10 0704)  Intake/Output from previous day:   Intake/Output this shift:    Physical Exam: Full strength bilateral lower extremities  Lab Results: No results found for this basename: WBC:2,HGB:2,HCT:2,PLT:2 in the last 72 hours BMET No results found for this basename: NA:2,K:2,CL:2,CO2:2,GLUCOSE:2,BUN:2,CREATININE:2,CALCIUM:2 in the last 72 hours  Studies/Results: No results found.  Assessment/Plan: D/C Home with HH    LOS: 4 days    Guyla Bless D, MD 01/10/2011, 8:06 AM

## 2011-01-10 NOTE — Discharge Summary (Signed)
Physician Discharge Summary  Patient ID: Maria Johnson MRN: [id number] DOB/AGE: [birth year] 30 y.o.  Admit date: 01/06/2011 Discharge date: 01/10/2011  Admission Diagnoses:  Discharge Diagnoses:  Active Problems:  * No active hospital problems. *    Discharged Condition: good  Hospital Course: Uncomplicated ALIF L5/S1  Consults: none  Significant Diagnostic Studies: None  Treatments: surgery: Uncomplicated ALIF L5/S1  Discharge Exam: Blood pressure 131/71, pulse 110, temperature 98.5 F (36.9 C), temperature source Oral, resp. rate 18, height 5\' 10"  (1.778 m), weight 97.523 kg (215 lb), SpO2 96.00%. Neurologic: Alert and oriented X 3, normal strength and tone. Normal symmetric reflexes. Normal coordination and gait Incision/Wound:C/D/I  Disposition: Home or Self Care  Discharge Orders    Future Orders Please Complete By Expires   Elevated toilet seat      Walker rolling      Ambulatory referral to Home Health      Comments:   Please evaluate Sapna CHRISTINIA LAMBETH for admission to [facility].  Disciplines requested: Physical Therapy and Occupational Therapy  Services to provide: Strengthening Exercises and Evaluate  Physician to follow patient's care (the person listed here will be responsible for signing ongoing orders): Referring Provider  Requested Start of Care Date: Tomorrow  Special Instructions:       Current Discharge Medication List    CONTINUE these medications which have NOT CHANGED   Details  ALPRAZolam (XANAX) 1 MG tablet Take 1 mg by mouth daily as needed. Anxiety     cyclobenzaprine (FLEXERIL) 10 MG tablet Take 10 mg by mouth 2 (two) times daily.      doxycycline (VIBRAMYCIN) 100 MG capsule Take 100 mg by mouth at bedtime.      HYDROcodone-acetaminophen (NORCO) 5-325 MG per tablet Take 1-2 tablets by mouth every 4 (four) hours as needed. For pain     metFORMIN (GLUCOPHAGE-XR) 500 MG 24 hr tablet Take 1,000 mg by mouth daily with breakfast.        pravastatin (PRAVACHOL) 20 MG tablet Take 20 mg by mouth daily.      traMADol (ULTRAM) 50 MG tablet Take 50 mg by mouth every 6 (six) hours as needed. Maximum dose= 8 tablets per day For pain      Vitamin D, Ergocalciferol, (DRISDOL) 50000 UNITS CAPS Take 50,000 Units by mouth every 7 (seven) days.        STOP taking these medications     ibuprofen (ADVIL,MOTRIN) 800 MG tablet      meloxicam (MOBIC) 7.5 MG tablet          Signed: Daphyne Miguez D 01/10/2011, 8:41 AM

## 2011-01-10 NOTE — Progress Notes (Signed)
Discharge planning. Spoke with patient, faxed orders for rolling walker, 3in1 and shower chair to [facility].

## 2011-01-10 NOTE — Progress Notes (Signed)
Physical Therapy Treatment and Discharge Summary Patient Details Name: Maria Johnson MRN: [id number] DOB: Jun 02, 1980 Today's Date: 01/10/2011  Patient is being discharged from PT services secondary to:    Goals met and no further therapy needs identified.  Please see Therapy Progress Note below for current level of functioning and progress toward goals.  Progress and discharge plan and discussed with patient and they    Agree  PT Assessment/Plan  PT - Assessment/Plan Comments on Treatment Session: Patient has achieved all goals set and demonstrates good understanding of post op care.  Follow Up Recommendations: None Equipment Recommended: Rolling walker with 5" wheels;3 in 1 bedside comode PT Goals  Acute Rehab PT Goals PT Goal: Rolling Supine to Left Side - Progress: Met PT Goal: Supine/Side to Sit - Progress: Met PT Goal: Sit to Supine/Side - Progress: Met PT Goal: Sit to Stand - Progress: Met PT Goal: Stand to Sit - Progress: Met PT Transfer Goal: Bed to Chair/Chair to Bed - Progress: Met PT Goal: Ambulate - Progress: Met PT Goal: Up/Down Stairs - Progress: Met Additional Goals PT Goal: Additional Goal #1 - Progress: Met  PT Treatment Precautions/Restrictions  Precautions Precautions: Back Precaution Booklet Issued: No Precaution Comments: Reviewed all back precautions. Patient able to recall and demonstrate adherence to in functional activity.  Required Braces or Orthoses: Yes Spinal Brace: Lumbar corset;Applied in sitting position Restrictions Weight Bearing Restrictions: No Mobility (including Balance) Bed Mobility Rolling Right: 7: Independent Right Sidelying to Sit: 6: Modified independent (Device/Increase time);HOB flat Sitting - Scoot to Edge of Bed: 7: Independent Sit to Supine - Right: 7: Independent;HOB flat Transfers Sit to Stand: 7: Independent;From bed;With upper extremity assist Sit to Stand Details (indicate cue type and reason): Correct hand  placement to and from device Stand to Sit: To bed;To chair/3-in-1;7: Independent;With upper extremity assist Ambulation/Gait Ambulation/Gait Assistance: 6: Modified independent (Device/Increase time) Ambulation Distance (Feet): 300 Feet Assistive device: Rolling walker Gait Pattern: Within Functional Limits Stairs Assistance: 7: Independent Stair Management Technique: One rail Right;Alternating pattern;Forwards Number of Stairs: 5     End of Session PT - End of Session Equipment Utilized During Treatment: Back brace Activity Tolerance: Patient tolerated treatment well Patient left:  (in shower) General Behavior During Session: [facility] for tasks performed Cognition: [facility] for tasks performed  Edwyna Perfect, PT  Pager [phone]  01/10/2011, 9:04 AM

## 2011-01-11 MED FILL — Sodium Chloride IV Soln 0.9%: INTRAVENOUS | Qty: 1000 | Status: AC

## 2011-01-11 MED FILL — Heparin Sodium (Porcine) Inj 1000 Unit/ML: INTRAMUSCULAR | Qty: 30 | Status: AC

## 2011-01-13 ENCOUNTER — Encounter (HOSPITAL_COMMUNITY): Payer: Self-pay | Admitting: Neurosurgery

## 2011-02-16 DIAGNOSIS — L709 Acne, unspecified: Secondary | ICD-10-CM | POA: Insufficient documentation

## 2011-03-21 ENCOUNTER — Ambulatory Visit
Payer: Managed Care, Other (non HMO) | Attending: Neurosurgery | Admitting: Rehabilitative and Restorative Service Providers"

## 2011-03-21 DIAGNOSIS — M545 Low back pain, unspecified: Secondary | ICD-10-CM | POA: Insufficient documentation

## 2011-03-21 DIAGNOSIS — M6281 Muscle weakness (generalized): Secondary | ICD-10-CM | POA: Insufficient documentation

## 2011-03-21 DIAGNOSIS — IMO0001 Reserved for inherently not codable concepts without codable children: Secondary | ICD-10-CM | POA: Insufficient documentation

## 2011-03-23 ENCOUNTER — Ambulatory Visit: Payer: Managed Care, Other (non HMO) | Admitting: Rehabilitative and Restorative Service Providers"

## 2011-03-28 ENCOUNTER — Ambulatory Visit: Payer: Managed Care, Other (non HMO) | Admitting: Rehabilitative and Restorative Service Providers"

## 2011-03-30 ENCOUNTER — Ambulatory Visit: Payer: Managed Care, Other (non HMO) | Admitting: Rehabilitative and Restorative Service Providers"

## 2011-04-04 ENCOUNTER — Ambulatory Visit
Payer: Managed Care, Other (non HMO) | Attending: Neurosurgery | Admitting: Rehabilitative and Restorative Service Providers"

## 2011-04-04 DIAGNOSIS — M545 Low back pain, unspecified: Secondary | ICD-10-CM | POA: Insufficient documentation

## 2011-04-04 DIAGNOSIS — M6281 Muscle weakness (generalized): Secondary | ICD-10-CM | POA: Insufficient documentation

## 2011-04-04 DIAGNOSIS — IMO0001 Reserved for inherently not codable concepts without codable children: Secondary | ICD-10-CM | POA: Insufficient documentation

## 2011-04-06 ENCOUNTER — Ambulatory Visit: Payer: Managed Care, Other (non HMO) | Admitting: Rehabilitative and Restorative Service Providers"

## 2011-04-11 ENCOUNTER — Ambulatory Visit: Payer: Managed Care, Other (non HMO) | Admitting: Rehabilitative and Restorative Service Providers"

## 2011-04-13 ENCOUNTER — Ambulatory Visit: Payer: Managed Care, Other (non HMO) | Admitting: Rehabilitative and Restorative Service Providers"

## 2011-04-19 ENCOUNTER — Encounter: Payer: Managed Care, Other (non HMO) | Admitting: Rehabilitative and Restorative Service Providers"

## 2011-04-21 ENCOUNTER — Ambulatory Visit: Payer: Managed Care, Other (non HMO) | Admitting: Rehabilitative and Restorative Service Providers"

## 2011-04-26 ENCOUNTER — Ambulatory Visit: Payer: Managed Care, Other (non HMO) | Admitting: Rehabilitative and Restorative Service Providers"

## 2011-04-27 ENCOUNTER — Encounter: Payer: Managed Care, Other (non HMO) | Admitting: Rehabilitative and Restorative Service Providers"

## 2011-04-28 ENCOUNTER — Encounter: Payer: Managed Care, Other (non HMO) | Admitting: Rehabilitative and Restorative Service Providers"

## 2011-05-03 ENCOUNTER — Ambulatory Visit
Payer: Managed Care, Other (non HMO) | Attending: Neurosurgery | Admitting: Rehabilitative and Restorative Service Providers"

## 2011-05-03 DIAGNOSIS — M545 Low back pain, unspecified: Secondary | ICD-10-CM | POA: Insufficient documentation

## 2011-05-03 DIAGNOSIS — IMO0001 Reserved for inherently not codable concepts without codable children: Secondary | ICD-10-CM | POA: Insufficient documentation

## 2011-05-03 DIAGNOSIS — M6281 Muscle weakness (generalized): Secondary | ICD-10-CM | POA: Insufficient documentation

## 2011-05-11 ENCOUNTER — Ambulatory Visit: Payer: Managed Care, Other (non HMO) | Admitting: Rehabilitative and Restorative Service Providers"

## 2011-05-19 ENCOUNTER — Ambulatory Visit: Payer: Managed Care, Other (non HMO) | Admitting: Rehabilitative and Restorative Service Providers"

## 2011-05-24 ENCOUNTER — Ambulatory Visit: Payer: Managed Care, Other (non HMO) | Admitting: Rehabilitative and Restorative Service Providers"

## 2012-01-26 IMAGING — RF DG LUMBAR SPINE 2-3V
1 series · 2 of 2 positions shown · non-contrast
Comparison: MRI 12/09/2010

CLINICAL DATA: Anterior fusion L5-S1

LUMBAR SPINE - 2-3 VIEW

[Series 1: run · 2 of 2 slices shown]
[im 1/2]
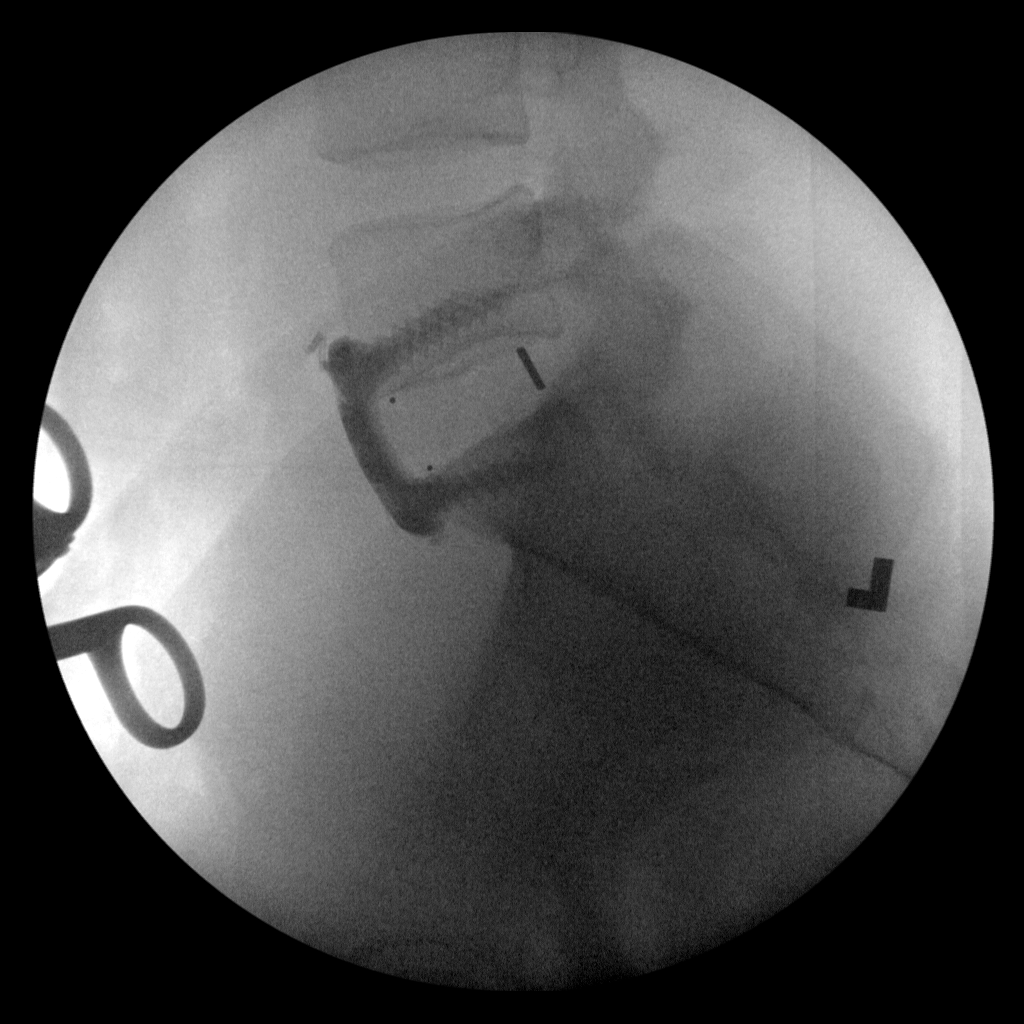
[im 2/2]
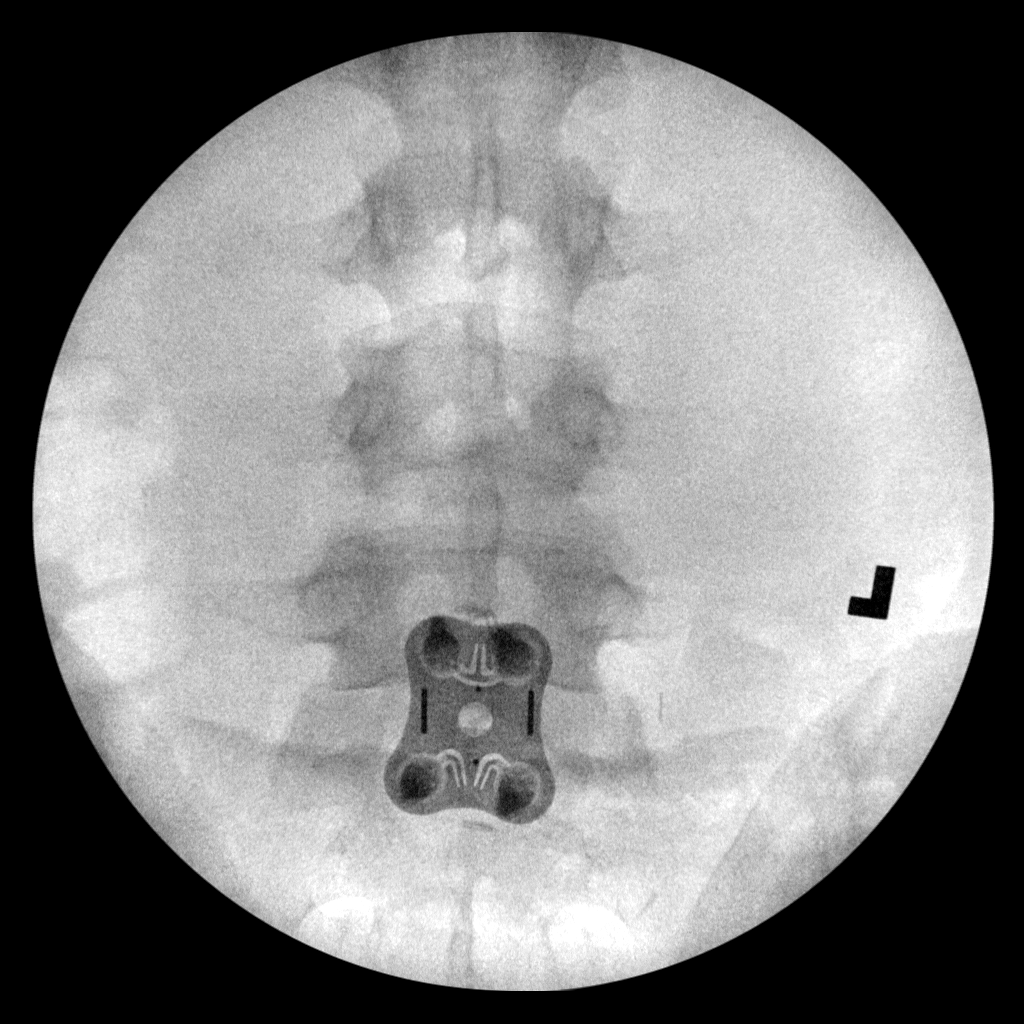

[2 of 2 positions shown; findings below may reference images not displayed]

FINDINGS: Anterior plate and screws at L5-S1 in good position.
Interbody spacer at L5-S1 in good position.
IMPRESSION: Satisfactory ALIF  L5-S1.

## 2012-05-16 ENCOUNTER — Telehealth: Payer: Self-pay | Admitting: Internal Medicine

## 2012-06-12 ENCOUNTER — Ambulatory Visit: Payer: Managed Care, Other (non HMO) | Admitting: Internal Medicine

## 2013-08-12 DIAGNOSIS — E1165 Type 2 diabetes mellitus with hyperglycemia: Secondary | ICD-10-CM

## 2013-08-12 HISTORY — DX: Type 2 diabetes mellitus with hyperglycemia: E11.65

## 2014-01-08 DIAGNOSIS — R079 Chest pain, unspecified: Secondary | ICD-10-CM | POA: Insufficient documentation

## 2014-01-08 DIAGNOSIS — F411 Generalized anxiety disorder: Secondary | ICD-10-CM | POA: Insufficient documentation

## 2014-01-08 DIAGNOSIS — F419 Anxiety disorder, unspecified: Secondary | ICD-10-CM | POA: Insufficient documentation

## 2014-01-08 HISTORY — DX: Generalized anxiety disorder: F41.1

## 2014-01-16 ENCOUNTER — Encounter (HOSPITAL_BASED_OUTPATIENT_CLINIC_OR_DEPARTMENT_OTHER): Payer: Self-pay | Admitting: *Deleted

## 2014-01-16 ENCOUNTER — Emergency Department (HOSPITAL_BASED_OUTPATIENT_CLINIC_OR_DEPARTMENT_OTHER)
Admission: EM | Admit: 2014-01-16 | Discharge: 2014-01-16 | Disposition: A | Discharge status: Home / Self Care | Payer: Worker's Compensation | Attending: Emergency Medicine | Admitting: Emergency Medicine

## 2014-01-16 DIAGNOSIS — Z8701 Personal history of pneumonia (recurrent): Secondary | ICD-10-CM | POA: Insufficient documentation

## 2014-01-16 DIAGNOSIS — G4730 Sleep apnea, unspecified: Secondary | ICD-10-CM | POA: Insufficient documentation

## 2014-01-16 DIAGNOSIS — Z8709 Personal history of other diseases of the respiratory system: Secondary | ICD-10-CM | POA: Insufficient documentation

## 2014-01-16 DIAGNOSIS — Z9981 Dependence on supplemental oxygen: Secondary | ICD-10-CM | POA: Insufficient documentation

## 2014-01-16 DIAGNOSIS — F419 Anxiety disorder, unspecified: Secondary | ICD-10-CM | POA: Insufficient documentation

## 2014-01-16 DIAGNOSIS — Z862 Personal history of diseases of the blood and blood-forming organs and certain disorders involving the immune mechanism: Secondary | ICD-10-CM | POA: Diagnosis not present

## 2014-01-16 DIAGNOSIS — L709 Acne, unspecified: Secondary | ICD-10-CM | POA: Diagnosis not present

## 2014-01-16 DIAGNOSIS — Z79899 Other long term (current) drug therapy: Secondary | ICD-10-CM | POA: Diagnosis not present

## 2014-01-16 DIAGNOSIS — G43809 Other migraine, not intractable, without status migrainosus: Secondary | ICD-10-CM

## 2014-01-16 DIAGNOSIS — E119 Type 2 diabetes mellitus without complications: Secondary | ICD-10-CM | POA: Diagnosis not present

## 2014-01-16 DIAGNOSIS — R51 Headache: Secondary | ICD-10-CM | POA: Diagnosis present

## 2014-01-16 MED ORDER — METOCLOPRAMIDE HCL 5 MG/ML IJ SOLN
10.0000 mg | Freq: Once | INTRAMUSCULAR | Status: AC
  Administered 2014-01-16: 10 mg via INTRAVENOUS
  Filled 2014-01-16: qty 2

## 2014-01-16 MED ORDER — KETOROLAC TROMETHAMINE 30 MG/ML IJ SOLN
30.0000 mg | Freq: Once | INTRAMUSCULAR | Status: AC
  Administered 2014-01-16: 30 mg via INTRAVENOUS
  Filled 2014-01-16: qty 1

## 2014-01-16 MED ORDER — DIPHENHYDRAMINE HCL 50 MG/ML IJ SOLN
25.0000 mg | Freq: Once | INTRAMUSCULAR | Status: AC
  Administered 2014-01-16: 25 mg via INTRAVENOUS
  Filled 2014-01-16: qty 1

## 2014-01-16 MED ORDER — DEXAMETHASONE SODIUM PHOSPHATE 10 MG/ML IJ SOLN
10.0000 mg | Freq: Once | INTRAMUSCULAR | Status: AC
  Administered 2014-01-16: 10 mg via INTRAVENOUS
  Filled 2014-01-16: qty 1

## 2014-01-16 MED ORDER — SODIUM CHLORIDE 0.9 % IV BOLUS (SEPSIS)
1000.0000 mL | Freq: Once | INTRAVENOUS | Status: AC
  Administered 2014-01-16: 1000 mL via INTRAVENOUS

## 2014-01-16 NOTE — Discharge Instructions (Signed)
Continue your home medications as before.  Return to the emergency department if you experience any new or concerning symptoms.   Migraine Headache A migraine headache is an intense, throbbing pain on one or both sides of your head. A migraine can last for 30 minutes to several hours. CAUSES  The exact cause of a migraine headache is not always known. However, a migraine may be caused when nerves in the brain become irritated and release chemicals that cause inflammation. This causes pain. Certain things may also trigger migraines, such as:  Alcohol.  Smoking.  Stress.  Menstruation.  Aged cheeses.  Foods or drinks that contain nitrates, glutamate, aspartame, or tyramine.  Lack of sleep.  Chocolate.  Caffeine.  Hunger.  Physical exertion.  Fatigue.  Medicines used to treat chest pain (nitroglycerine), birth control pills, estrogen, and some blood pressure medicines. SIGNS AND SYMPTOMS  Pain on one or both sides of your head.  Pulsating or throbbing pain.  Severe pain that prevents daily activities.  Pain that is aggravated by any physical activity.  Nausea, vomiting, or both.  Dizziness.  Pain with exposure to bright lights, loud noises, or activity.  General sensitivity to bright lights, loud noises, or smells. Before you get a migraine, you may get warning signs that a migraine is coming (aura). An aura may include:  Seeing flashing lights.  Seeing bright spots, halos, or zigzag lines.  Having tunnel vision or blurred vision.  Having feelings of numbness or tingling.  Having trouble talking.  Having muscle weakness. DIAGNOSIS  A migraine headache is often diagnosed based on:  Symptoms.  Physical exam.  A CT scan or MRI of your head. These imaging tests cannot diagnose migraines, but they can help rule out other causes of headaches. TREATMENT Medicines may be given for pain and nausea. Medicines can also be given to help prevent recurrent  migraines.  HOME CARE INSTRUCTIONS  Only take over-the-counter or prescription medicines for pain or discomfort as directed by your health care provider. The use of long-term narcotics is not recommended.  Lie down in a dark, quiet room when you have a migraine.  Keep a journal to find out what may trigger your migraine headaches. For example, write down:  What you eat and drink.  How much sleep you get.  Any change to your diet or medicines.  Limit alcohol consumption.  Quit smoking if you smoke.  Get 7-9 hours of sleep, or as recommended by your health care provider.  Limit stress.  Keep lights dim if bright lights bother you and make your migraines worse. SEEK IMMEDIATE MEDICAL CARE IF:   Your migraine becomes severe.  You have a fever.  You have a stiff neck.  You have vision loss.  You have muscular weakness or loss of muscle control.  You start losing your balance or have trouble walking.  You feel faint or pass out.  You have severe symptoms that are different from your first symptoms. MAKE SURE YOU:   Understand these instructions.  Will watch your condition.  Will get help right away if you are not doing well or get worse. Document Released: 01/17/2005 Document Revised: 06/03/2013 Document Reviewed: 09/24/2012 [facility] Patient Information 2015 [place], [place]. This information is not intended to replace advice given to you by your health care provider. Make sure you discuss any questions you have with your health care provider.

## 2014-01-16 NOTE — ED Provider Notes (Signed)
CSN: [id number]     Arrival date & time 01/16/14  1154 History   First MD Initiated Contact with Patient 01/16/14 1224     Chief Complaint  Patient presents with  . Headache     (Consider location/radiation/quality/duration/timing/severity/associated sxs/prior Treatment) HPI Comments: Patient is a 33 year old female with history of diabetes and migraine headaches. She presents today with complaint of headache. She states she was exposed to mold remediation fumes at her apartment complex yesterday and believes this was the inciting event. She denies any trauma. She denies any fevers, chills, or stiff neck. This feels similar to her typical migraines.  Patient is a 33 y.o. female presenting with headaches. The history is provided by the patient.  Headache Pain location:  Generalized Quality:  Dull Radiates to:  Does not radiate Onset quality:  Sudden Duration:  1 day Timing:  Constant Progression:  Worsening Chronicity:  New Similar to prior headaches: yes   Context: activity   Relieved by:  Nothing Worsened by:  Activity Ineffective treatments:  NSAIDs   Past Medical History  Diagnosis Date  . Bronchitis     history of  . Sleep apnea     has cpap  . Diabetes mellitus   . Acne     takes doxocycline daily for acne  . Anxiety   . Carpal tunnel syndrome on both sides   . Pneumonia 1990's  . Sickle cell trait   . Headache(784.0)     hx of migraines   Past Surgical History  Procedure Laterality Date  . Back surgery      Laminectomy Sept 14, 2011,  . Ankle surgery  05/2003    torn cartilage  repair; left  . Lumbar fusion  01/06/11    L5-S1  . Diagnostic laparoscopy  01/2002    removed scar tissue  . Tonsillectomy  03/2005  . Vaginal hysterectomy  08/2007    partial   . Laminectomy and microdiscectomy lumbar spine  10/2009    L4, L5, S1  . Anterior lumbar fusion  01/06/2011    Procedure: ANTERIOR LUMBAR FUSION 1 LEVEL;  Surgeon: Dorian HeckleJoseph D Stern, MD;  Location: [facility];  Service: Neurosurgery;  Laterality: N/A;  Lumbar five-Sacral one  Anterior Lumbar Interbody Fusion with Instrumentation    No family history on file. History  Substance Use Topics  . Smoking status: Never Smoker   . Smokeless tobacco: Never Used  . Alcohol Use: Yes     Comment: occassional   OB History    No data available     Review of Systems  Neurological: Positive for headaches.  All other systems reviewed and are negative.     Allergies  Review of patient's allergies indicates no known allergies.  Home Medications   Prior to Admission medications   Medication Sig Start Date End Date Taking? Authorizing Provider  ALPRAZolam Prudy Feeler(XANAX) 1 MG tablet Take 1 mg by mouth daily as needed. Anxiety    Yes Historical Provider, MD  doxycycline (VIBRAMYCIN) 100 MG capsule Take 100 mg by mouth at bedtime.     Yes Historical Provider, MD  cyclobenzaprine (FLEXERIL) 10 MG tablet Take 10 mg by mouth 2 (two) times daily.      Historical Provider, MD  HYDROcodone-acetaminophen (NORCO) 5-325 MG per tablet Take 1-2 tablets by mouth every 4 (four) hours as needed. For pain     Historical Provider, MD  metFORMIN (GLUCOPHAGE-XR) 500 MG 24 hr tablet Take 1,000 mg by mouth daily with breakfast.  Historical Provider, MD  pravastatin (PRAVACHOL) 20 MG tablet Take 20 mg by mouth daily.      Historical Provider, MD  traMADol (ULTRAM) 50 MG tablet Take 50 mg by mouth every 6 (six) hours as needed. Maximum dose= 8 tablets per day For pain      Historical Provider, MD  Vitamin D, Ergocalciferol, (DRISDOL) 50000 UNITS CAPS Take 50,000 Units by mouth every 7 (seven) days.      Historical Provider, MD   BP 125/79 mmHg  Pulse 90  Temp(Src) 98.2 F (36.8 C) (Oral)  Resp 16  Ht 5\' 10"  (1.778 m)  Wt 228 lb (103.42 kg)  BMI 32.71 kg/m2  SpO2 100% Physical Exam  Constitutional: She is oriented to person, place, and time. She appears well-developed and well-nourished. No distress.  HENT:  Head:  Normocephalic and atraumatic.  Mouth/Throat: Oropharynx is clear and moist.  Eyes: EOM are normal. Pupils are equal, round, and reactive to light.  There is no papilledema.  Neck: Normal range of motion. Neck supple.  Cardiovascular: Normal rate and regular rhythm.  Exam reveals no gallop and no friction rub.   No murmur heard. Pulmonary/Chest: Effort normal and breath sounds normal. No respiratory distress. She has no wheezes.  Abdominal: Soft. Bowel sounds are normal. She exhibits no distension. There is no tenderness.  Musculoskeletal: Normal range of motion. She exhibits no edema.  Neurological: She is alert and oriented to person, place, and time. No cranial nerve deficit. She exhibits normal muscle tone. Coordination normal.  Skin: Skin is warm and dry. She is not diaphoretic.  Nursing note and vitals reviewed.   ED Course  Procedures (including critical care time) Labs Review Labs Reviewed - No data to display  Imaging Review No results found.   EKG Interpretation   Date/Time:  Thursday January 16 2014 12:13:36 EST Ventricular Rate:  75 PR Interval:  144 QRS Duration: 82 QT Interval:  366 QTC Calculation: 408 R Axis:   38 Text Interpretation:  Normal sinus rhythm Nonspecific T wave abnormality  Abnormal ECG Confirmed by DELOS  MD, Dotsie Gillette ([id number]) on 01/16/2014  12:26:03 PM      MDM   Final diagnoses:  None    Patient feeling better with migraine cocktail. She is neurologically intact and I do not feel further imaging or workup is indicated. She will be discharged home and advised to take her home medications as needed.    Geoffery Lyonsouglas Jerrit Horen, MD 01/16/14 [id number]

## 2014-01-16 NOTE — ED Notes (Signed)
MD at bedside. 

## 2015-10-22 DIAGNOSIS — M2140 Flat foot [pes planus] (acquired), unspecified foot: Secondary | ICD-10-CM

## 2015-10-22 DIAGNOSIS — M93272 Osteochondritis dissecans, left ankle and joints of left foot: Secondary | ICD-10-CM

## 2015-10-22 HISTORY — DX: Flat foot (pes planus) (acquired), unspecified foot: M21.40

## 2015-10-22 HISTORY — DX: Osteochondritis dissecans, left ankle and joints of left foot: M93.272

## 2017-01-18 DIAGNOSIS — J069 Acute upper respiratory infection, unspecified: Secondary | ICD-10-CM | POA: Diagnosis not present

## 2017-01-31 DIAGNOSIS — J9801 Acute bronchospasm: Secondary | ICD-10-CM | POA: Diagnosis not present

## 2017-01-31 DIAGNOSIS — R05 Cough: Secondary | ICD-10-CM | POA: Diagnosis not present

## 2017-01-31 DIAGNOSIS — J40 Bronchitis, not specified as acute or chronic: Secondary | ICD-10-CM | POA: Diagnosis not present

## 2017-01-31 DIAGNOSIS — R12 Heartburn: Secondary | ICD-10-CM | POA: Diagnosis not present

## 2017-08-24 ENCOUNTER — Encounter: Payer: Self-pay | Admitting: Family Medicine

## 2017-08-24 ENCOUNTER — Ambulatory Visit: Payer: 59 | Admitting: Family Medicine

## 2017-08-24 VITALS — BP 124/82 | HR 104 | Temp 98.1°F | Resp 16 | Ht 70.0 in | Wt 259.0 lb

## 2017-08-24 DIAGNOSIS — E559 Vitamin D deficiency, unspecified: Secondary | ICD-10-CM

## 2017-08-24 DIAGNOSIS — G4700 Insomnia, unspecified: Secondary | ICD-10-CM | POA: Insufficient documentation

## 2017-08-24 DIAGNOSIS — R030 Elevated blood-pressure reading, without diagnosis of hypertension: Secondary | ICD-10-CM

## 2017-08-24 HISTORY — DX: Insomnia, unspecified: G47.00

## 2017-08-24 HISTORY — DX: Vitamin D deficiency, unspecified: E55.9

## 2017-08-24 MED ORDER — DOXYCYCLINE HYCLATE 100 MG PO CAPS: 100.0000 mg | ORAL_CAPSULE | Freq: Two times a day (BID) | ORAL | 5 refills | Status: DC

## 2017-08-24 MED ORDER — VITAMIN D (ERGOCALCIFEROL) 1.25 MG (50000 UNIT) PO CAPS: 50000.0000 [IU] | ORAL_CAPSULE | ORAL | 0 refills | Status: DC

## 2017-08-24 MED ORDER — ALPRAZOLAM 0.5 MG PO TABS: 0.5000 mg | ORAL_TABLET | Freq: Every day | ORAL | 0 refills | Status: DC | PRN

## 2017-08-24 NOTE — Patient Instructions (Signed)
Around 3 times per week, check your blood pressure 4 times per day. Twice in the morning and twice in the evening. The readings should be at least one minute apart. Write down these values and bring them to your next nurse visit/appointment.  When you check your BP, make sure you have been doing something calm/relaxing 5 minutes prior to checking. Both feet should be flat on the floor and you should be sitting. Use your left arm and make sure it is in a relaxed position (on a table), and that the cuff is at the approximate level/height of your heart.  Bring your blood pressure monitor to your next appointment.   Keep the diet clean. Stay physically active. I want you to start lifting weights.  Let us know if you need anything.

## 2017-08-24 NOTE — Progress Notes (Signed)
Chief Complaint  Patient presents with  . New Patient (Initial Visit)    needing to establish care  . Hypertension    highest 200/110, ranging 120-130/80-90       New Patient Visit SUBJECTIVE: HPI: Maria Johnson is an 37 y.o.female who is being seen for establishing care.  The patient was previously seen at [facility].  Blood pressure Patient presents for high bp discussion. She does monitor home blood pressures. Blood pressures ranging on average from 110-140's/70-90's. She is not on any medications. Patient has these side effects of medication: none She is now adhering to a healthy diet overall. Exercise: some walking  The patient has a history of vitamin D deficiency.  She is to be on prescription strength weekly vitamin D.  She was taken off, rechecked her labs, and then placed back on after it fell out of the normal range again.  She is currently taking 10,000 units daily.  She takes Xanax for panic attacks/sleep.  She usually takes around 2/week.  She has not had anything else for anxiety or sleep.  She states that when she lies down, sometimes her mind will not turn off.  No Known Allergies  Past Medical History:  Diagnosis Date  . Acne    takes doxocycline daily for acne  . Anxiety   . Bronchitis    history of  . Carpal tunnel syndrome on both sides   . Chicken pox   . Depression   . Diabetes mellitus   . Headache   . Headache(784.0)    hx of migraines  . Hyperlipidemia   . Pneumonia 1990's  . Sickle cell trait (HCC)   . Sleep apnea    has cpap   Past Surgical History:  Procedure Laterality Date  . ANKLE SURGERY  05/2003   torn cartilage  repair; left  . ANTERIOR LUMBAR FUSION  01/06/2011   Procedure: ANTERIOR LUMBAR FUSION 1 LEVEL;  Surgeon: Dorian Heckle, MD;  Location: [facility];  Service: Neurosurgery;  Laterality: N/A;  Lumbar five-Sacral one  Anterior Lumbar Interbody Fusion with Instrumentation   . BACK SURGERY     Laminectomy Sept 14, 2011,  .  DIAGNOSTIC LAPAROSCOPY  01/2002   removed scar tissue  . LAMINECTOMY AND MICRODISCECTOMY LUMBAR SPINE  10/2009   L4, L5, S1  . LUMBAR FUSION  01/06/11   L5-S1  . TONSILLECTOMY  03/2005  . VAGINAL HYSTERECTOMY  08/2007   partial    Family History  Problem Relation Age of Onset  . Miscarriages / [place] Mother   . Hypertension Mother   . Alcohol abuse Father   . Depression Father   . Drug abuse Father   . Hyperlipidemia Father   . Stroke Father   . Depression Daughter   . Diabetes Daughter   . Hypertension Daughter   . Miscarriages / [place] Daughter   . Birth defects Son   . Diabetes Maternal Grandmother   . Heart attack Maternal Grandmother   . Hypertension Maternal Grandmother   . Stroke Maternal Grandmother   . Diabetes Maternal Grandfather   . Hypertension Maternal Grandfather   . Diabetes Paternal Grandmother   . Lupus Paternal Grandmother    No Known Allergies  Current Outpatient Medications:  .  doxycycline (VIBRAMYCIN) 100 MG capsule, Take 1 capsule (100 mg total) by mouth 2 (two) times daily., Disp: 60 capsule, Rfl: 5 .  pravastatin (PRAVACHOL) 20 MG tablet, Take 20 mg by mouth daily.  , Disp: , Rfl:  .  Vitamin D, Ergocalciferol, (DRISDOL) 50000 units CAPS capsule, Take 1 capsule (50,000 Units total) by mouth every 7 (seven) days., Disp: 12 capsule, Rfl: 0 .  ALPRAZolam (XANAX) 0.5 MG tablet, Take 1 tablet (0.5 mg total) by mouth daily as needed for anxiety., Disp: 30 tablet, Rfl: 0  Patient's last menstrual period was 08/09/2007.  ROS Cardiovascular: Denies chest pain  Respiratory: Denies dyspnea   OBJECTIVE: BP 124/82 (BP Location: Right Arm, Patient Position: Sitting, Cuff Size: Large)   Pulse (!) 104   Temp 98.1 F (36.7 C) (Oral)   Resp 16   Ht 5\' 10"  (1.778 m)   Wt 259 lb (117.5 kg)   LMP 08/09/2007   SpO2 98%   BMI 37.16 kg/m   Constitutional: -  VS reviewed -  Well developed, well nourished, appears stated age -  No apparent distress   Psychiatric: -  Oriented to person, place, and time -  Memory intact -  Affect and mood normal -  Fluent conversation, good eye contact -  Judgment and insight age appropriate  Eye: -  Conjunctivae clear, no discharge -  Pupils symmetric, round, reactive to light  ENMT: -  MMM    Pharynx moist, no exudate, no erythema  Neck: -  No gross swelling, no palpable masses -  Thyroid midline, not enlarged, mobile, no palpable masses  Cardiovascular: -  RRR -  No LE edema  Respiratory: -  Normal respiratory effort, no accessory muscle use, no retraction -  Breath sounds equal, no wheezes, no ronchi, no crackles  Gastrointestinal: -  Bowel sounds normal -  No tenderness, no distention, no guarding, no masses  Neurological:  -  CN II - XII grossly intact -  Sensation grossly intact to light touch, equal bilaterally  Musculoskeletal: -  No clubbing, no cyanosis -  Gait normal  Skin: -  No significant lesion on inspection -  Warm and dry to palpation   ASSESSMENT/PLAN: Elevated blood pressure reading  Vitamin D deficiency  Insomnia, unspecified type  Patient instructed to sign release of records form from her previous PCP. Check home blood pressures.  Write them down.  Check 1 minute apart.  Bring readings and monitor to next appointment.  Counseled on diet and exercise.  She was challenged to lift weights. Start back on prescription strength vitamin D.  She may need this long-term.  We will recheck her level at her next appointment. I will refill her Xanax for now.  We will discuss melatonin versus CBT versus trazodone at her next appointment.  She is open to alternatives. Patient should return in 1 mo for CPE. The patient voiced understanding and agreement to the plan.   Jilda Rocheicholas Paul [place], DO 08/24/17  5:28 PM

## 2017-09-20 ENCOUNTER — Ambulatory Visit: Payer: 59 | Admitting: Family Medicine

## 2017-09-20 ENCOUNTER — Encounter: Payer: Self-pay | Admitting: Family Medicine

## 2017-09-20 VITALS — BP 120/86 | HR 75 | Temp 97.9°F | Ht 70.0 in | Wt 260.0 lb

## 2017-09-20 DIAGNOSIS — M79605 Pain in left leg: Secondary | ICD-10-CM | POA: Diagnosis not present

## 2017-09-20 NOTE — Progress Notes (Signed)
Musculoskeletal Exam  Patient: Maria Johnson DOB: 09/16/1980  DOS: 09/20/2017  SUBJECTIVE:  Chief Complaint:   Chief Complaint  Patient presents with  . Ankle Pain    left    Maria Johnson is a 37 y.o.  female for evaluation and treatment of L ankle pain.   Onset:  1 day ago. No inj or change in activity.  Location: Just above L ankle Character:  aching  Progression of issue:  is unchanged Associated symptoms: Difficulty walking Treatment: to date has been ice and OTC NSAIDS.   Neurovascular symptoms: no No hx of DVT in personal or famhx, no recent injury, travel, med changes, surgery, prolonged bedrest  ROS: Musculoskeletal/Extremities: +LLE pain  Past Medical History:  Diagnosis Date  . Acne    takes doxocycline daily for acne  . Anxiety   . Bronchitis    history of  . Carpal tunnel syndrome on both sides   . Chicken pox   . Depression   . Diabetes mellitus   . Headache   . Headache(784.0)    hx of migraines  . Hyperlipidemia   . Pneumonia 1990's  . Sickle cell trait (HCC)   . Sleep apnea    has cpap    Objective: VITAL SIGNS: BP 120/86 (BP Location: Left Arm, Patient Position: Sitting, Cuff Size: Large)   Pulse 75   Temp 97.9 F (36.6 C) (Oral)   Ht 5\' 10"  (1.778 m)   Wt 260 lb (117.9 kg)   LMP 08/09/2007   SpO2 98%   BMI 37.31 kg/m  Constitutional: Well formed, well developed. No acute distress. Cardiovascular: Brisk cap refill Thorax & Lungs: No accessory muscle use Musculoskeletal: L ankle.   Normal active range of motion: yes.   Normal passive range of motion: yes Tenderness to palpation: yes over distal tibia Deformity: no Ecchymosis: no Tests positive: None Tests negative: Squeeze, ant drawer No ttp over calves b/l Neurologic: Normal sensory function. No focal deficits noted.  Psychiatric: Normal mood. Age appropriate judgment and insight. Alert & oriented x 3.    Assessment:  Left leg pain  Plan: MTSS vs stress fx? Given  deductible, will hold off on imaging at this time. Ice, CAM walk, Tylenol, nsaids, crutches.  F/u prn. The patient voiced understanding and agreement to the plan.   Jilda Rocheicholas Paul [place], DO 09/20/17  1:58 PM

## 2017-09-20 NOTE — Patient Instructions (Addendum)
Ice/cold pack over area for 10-15 min twice daily.  OK to take Tylenol 1000 mg (2 extra strength tabs) or 975 mg (3 regular strength tabs) every 6 hours as needed.  Ibuprofen 400-600 mg (2-3 over the counter strength tabs) every 6 hours as needed for pain.  Use crutches as needed.  Wear boot as long as you are having pain with walking.   Let us know if you need anything.

## 2017-09-20 NOTE — Progress Notes (Signed)
Pre visit review using our clinic review tool, if applicable. No additional management support is needed unless otherwise documented below in the visit note. 

## 2017-09-25 ENCOUNTER — Ambulatory Visit (INDEPENDENT_AMBULATORY_CARE_PROVIDER_SITE_OTHER): Payer: 59 | Admitting: Family Medicine

## 2017-09-25 ENCOUNTER — Encounter: Payer: Self-pay | Admitting: Family Medicine

## 2017-09-25 VITALS — BP 120/80 | HR 67 | Temp 98.2°F | Ht 70.0 in | Wt 260.0 lb

## 2017-09-25 DIAGNOSIS — R7303 Prediabetes: Secondary | ICD-10-CM | POA: Diagnosis not present

## 2017-09-25 DIAGNOSIS — Z114 Encounter for screening for human immunodeficiency virus [HIV]: Secondary | ICD-10-CM

## 2017-09-25 DIAGNOSIS — G4733 Obstructive sleep apnea (adult) (pediatric): Secondary | ICD-10-CM

## 2017-09-25 DIAGNOSIS — Z9989 Dependence on other enabling machines and devices: Secondary | ICD-10-CM

## 2017-09-25 DIAGNOSIS — Z0000 Encounter for general adult medical examination without abnormal findings: Secondary | ICD-10-CM

## 2017-09-25 HISTORY — DX: Dependence on other enabling machines and devices: Z99.89

## 2017-09-25 HISTORY — DX: Encounter for general adult medical examination without abnormal findings: Z00.00

## 2017-09-25 HISTORY — DX: Obstructive sleep apnea (adult) (pediatric): G47.33

## 2017-09-25 LAB — CBC
HEMATOCRIT: 39.2 % (ref 36.0–46.0)
HEMOGLOBIN: 12.6 g/dL (ref 12.0–15.0)
MCHC: 32.2 g/dL (ref 30.0–36.0)
MCV: 82.3 fl (ref 78.0–100.0)
Platelets: 388 10*3/uL (ref 150.0–400.0)
RBC: 4.77 Mil/uL (ref 3.87–5.11)
RDW: 14.9 % (ref 11.5–15.5)
WBC: 5.3 10*3/uL (ref 4.0–10.5)

## 2017-09-25 LAB — LIPID PANEL
CHOLESTEROL: 207 mg/dL — AB (ref 0–200)
HDL: 48.4 mg/dL (ref >39.00)
LDL Cholesterol: 132 mg/dL — ABNORMAL HIGH (ref 0–99)
NonHDL: 158.96
Total CHOL/HDL Ratio: 4
Triglycerides: 134 mg/dL (ref 0.0–149.0)
VLDL: 26.8 mg/dL (ref 0.0–40.0)

## 2017-09-25 LAB — COMPREHENSIVE METABOLIC PANEL
ALBUMIN: 4.3 g/dL (ref 3.5–5.2)
ALT: 29 U/L (ref 0–35)
AST: 24 U/L (ref 0–37)
Alkaline Phosphatase: 68 U/L (ref 39–117)
BILIRUBIN TOTAL: 0.3 mg/dL (ref 0.2–1.2)
BUN: 11 mg/dL (ref 6–23)
CO2: 28 mEq/L (ref 19–32)
Calcium: 10 mg/dL (ref 8.4–10.5)
Chloride: 101 mEq/L (ref 96–112)
Creatinine, Ser: 1.07 mg/dL (ref 0.40–1.20)
GFR: 74.05 mL/min (ref >60.00)
Glucose, Bld: 147 mg/dL — ABNORMAL HIGH (ref 70–99)
POTASSIUM: 4.3 meq/L (ref 3.5–5.1)
Sodium: 137 mEq/L (ref 135–145)
TOTAL PROTEIN: 7.3 g/dL (ref 6.0–8.3)

## 2017-09-25 LAB — HEMOGLOBIN A1C: Hgb A1c MFr Bld: 7.4 % — ABNORMAL HIGH (ref 4.6–6.5)

## 2017-09-25 NOTE — Patient Instructions (Addendum)
Give us 2-3 business days to get the results of your labs back.  Aim to do some physical exertion for 150 minutes per week. This is typically divided into 5 days per week, 30 minutes per day. The activity should be enough to get your heart rate up. Anything is better than nothing if you have time constraints.  If you do not hear anything about your referral in the next 1-2 weeks, call our office and ask for an update.  Let us know if you need anything.

## 2017-09-25 NOTE — Progress Notes (Signed)
Chief Complaint  Patient presents with  . Annual Exam     Well Woman Maria Johnson is here for a complete physical.   Her last physical was >1 year ago.  Current diet: in general, an "OK" diet. Current exercise: walking, some weights. +snoring; has had sleep studies and dx'd with OSA; lost CPAP Patient's last menstrual period was 08/09/2007.  Seatbelt? Yes  Health Maintenance Tetanus- Yes HIV screening- Yes  Past Medical History:  Diagnosis Date  . Acne    takes doxocycline daily for acne  . Anxiety   . Bronchitis    history of  . Carpal tunnel syndrome on both sides   . Chicken pox   . Depression   . Diabetes mellitus   . Headache(784.0)    hx of migraines  . Hyperlipidemia   . Sickle cell trait (HCC)   . Sleep apnea    has cpap     Past Surgical History:  Procedure Laterality Date  . ANKLE SURGERY  05/2003   torn cartilage  repair; left  . ANTERIOR LUMBAR FUSION  01/06/2011   Procedure: ANTERIOR LUMBAR FUSION 1 LEVEL;  Surgeon: Dorian Heckle, MD;  Location: [facility];  Service: Neurosurgery;  Laterality: N/A;  Lumbar five-Sacral one  Anterior Lumbar Interbody Fusion with Instrumentation   . BACK SURGERY     Laminectomy Sept 14, 2011,  . DIAGNOSTIC LAPAROSCOPY  01/2002   removed scar tissue  . LAMINECTOMY AND MICRODISCECTOMY LUMBAR SPINE  10/2009   L4, L5, S1  . LUMBAR FUSION  01/06/11   L5-S1  . TONSILLECTOMY  03/2005  . VAGINAL HYSTERECTOMY  08/2007   partial     Medications  Current Outpatient Medications on File Prior to Visit  Medication Sig Dispense Refill  . ALPRAZolam (XANAX) 0.5 MG tablet Take 1 tablet (0.5 mg total) by mouth daily as needed for anxiety. 30 tablet 0  . doxycycline (VIBRAMYCIN) 100 MG capsule Take 1 capsule (100 mg total) by mouth 2 (two) times daily. 60 capsule 5  . pravastatin (PRAVACHOL) 20 MG tablet Take 20 mg by mouth daily.      . Vitamin D, Ergocalciferol, (DRISDOL) 50000 units CAPS capsule Take 1 capsule (50,000 Units  total) by mouth every 7 (seven) days. 12 capsule 0    Allergies No Known Allergies  Review of Systems: Constitutional:  no unexpected weight changes Eye:  no recent significant change in vision Ear/Nose/Mouth/Throat:  Ears:  no tinnitus or vertigo and no recent change in hearing Nose/Mouth/Throat:  no complaints of nasal congestion, no sore throat Cardiovascular: no chest pain Respiratory:  no cough and no shortness of breath Gastrointestinal:  no abdominal pain, no change in bowel habits GU:  Female: negative for dysuria or pelvic pain Musculoskeletal/Extremities: +LLE pain (improved from recent visit) no pain of the joints Integumentary (Skin/Breast):  no abnormal skin lesions reported Neurologic:  no headaches Endocrine:  denies fatigue Hematologic/Lymphatic:  No areas of easy bleeding  Exam BP 120/80 (BP Location: Left Arm, Patient Position: Sitting, Cuff Size: Large)   Pulse 67   Temp 98.2 F (36.8 C) (Oral)   Ht 5\' 10"  (1.778 m)   Wt 260 lb (117.9 kg)   LMP 08/09/2007   SpO2 97%   BMI 37.31 kg/m  General:  well developed, well nourished, in no apparent distress Skin:  no significant moles, warts, or growths Head:  no masses, lesions, or tenderness Eyes:  pupils equal and round, sclera anicteric without injection Ears:  canals  without lesions, TMs shiny without retraction, no obvious effusion, no erythema Nose:  nares patent, septum midline, mucosa normal, and no drainage or sinus tenderness Throat/Pharynx:  lips and gingiva without lesion; tongue and uvula midline; non-inflamed pharynx; no exudates or postnasal drainage Neck: neck supple without adenopathy, thyromegaly, or masses Lungs:  clear to auscultation, breath sounds equal bilaterally, no respiratory distress Cardio:  regular rate and rhythm, no bruits, no LE edema Abdomen:  abdomen soft, nontender; bowel sounds normal; no masses or organomegaly Genital: Defer to GYN Musculoskeletal:  symmetrical muscle groups  noted without atrophy or deformity Extremities:  no clubbing, cyanosis, or edema, no deformities, no skin discoloration Neuro:  gait normal; deep tendon reflexes normal and symmetric Psych: well oriented with normal range of affect and appropriate judgment/insight  Assessment and Plan  Well adult exam - Plan: CBC, Comprehensive metabolic panel, Lipid panel  Screening for HIV (human immunodeficiency virus) - Plan: HIV antibody  OSA on CPAP - Plan: Ambulatory referral to Pulmonology  Prediabetes - Plan: Hemoglobin A1c   Well [age] y.o. female. Counseled on diet and exercise. She will do better with her diet.  Other orders as above. Follow up in 6 mo. The patient voiced understanding and agreement to the plan.  Jilda Rocheicholas Paul [place], DO 09/25/17 7:28 AM

## 2017-09-25 NOTE — Progress Notes (Signed)
Pre visit review using our clinic review tool, if applicable. No additional management support is needed unless otherwise documented below in the visit note. 

## 2017-09-26 ENCOUNTER — Encounter: Payer: Self-pay | Admitting: Family Medicine

## 2017-09-26 LAB — HIV ANTIBODY (ROUTINE TESTING W REFLEX): HIV: NONREACTIVE

## 2017-09-29 ENCOUNTER — Other Ambulatory Visit: Payer: Self-pay | Admitting: Family Medicine

## 2017-09-29 ENCOUNTER — Encounter: Payer: Self-pay | Admitting: Family Medicine

## 2017-09-29 MED ORDER — ATORVASTATIN CALCIUM 40 MG PO TABS: 40.0000 mg | ORAL_TABLET | Freq: Every day | ORAL | 3 refills | Status: DC

## 2017-09-29 MED ORDER — METFORMIN HCL ER 500 MG PO TB24: 500.0000 mg | ORAL_TABLET | Freq: Every day | ORAL | 2 refills | Status: DC

## 2017-10-23 ENCOUNTER — Ambulatory Visit: Payer: 59 | Admitting: Family Medicine

## 2017-10-23 ENCOUNTER — Encounter: Payer: Self-pay | Admitting: Family Medicine

## 2017-10-23 VITALS — BP 132/68 | HR 105 | Temp 98.1°F | Resp 20 | Ht 69.0 in | Wt 262.0 lb

## 2017-10-23 DIAGNOSIS — J209 Acute bronchitis, unspecified: Secondary | ICD-10-CM

## 2017-10-23 MED ORDER — PROMETHAZINE-DM 6.25-15 MG/5ML PO SYRP: 5.0000 mL | ORAL_SOLUTION | Freq: Four times a day (QID) | ORAL | 0 refills | Status: DC | PRN

## 2017-10-23 MED ORDER — METHYLPREDNISOLONE ACETATE 80 MG/ML IJ SUSP
80.0000 mg | Freq: Once | INTRAMUSCULAR | Status: AC
  Administered 2017-10-23: 80 mg via INTRAMUSCULAR

## 2017-10-23 NOTE — Addendum Note (Signed)
Addended by: Orlene OchRENCE, Tolbert Matheson N on: 10/23/2017 02:52 PM   Modules accepted: Orders

## 2017-10-23 NOTE — Patient Instructions (Signed)
Continue to push fluids, practice good hand hygiene, and cover your mouth if you cough.  If you start having fevers, shaking or shortness of breath, seek immediate care.  For symptoms, consider using Vick's VapoRub on chest or under nose, air humidifier, Benadryl at night, and elevating the head of the bed. Tylenol and ibuprofen for aches and pains you may be experiencing.   Let me know if you are not getting better in 2 days via MyChart.  Let us know if you need anything.

## 2017-10-23 NOTE — Progress Notes (Signed)
Chief Complaint  Patient presents with  . URI    cough, congestion, body aches, itchy throat X 2 weeks    Maria Johnson Erich here for URI complaints.  Duration: 10 days  Associated symptoms: sinus congestion, rhinorrhea, itchy watery eyes, ear drainage, shortness of breath and cough Denies: sinus pain, ear pain, sore throat, wheezing and fevers Treatment to date: Nyquil, [place]-Selzers, Vaporub, humidifier w peppermint, steam, Benadryl Sick contacts: Yes- niece and son  ROS:  Const: Denies fevers HEENT: As noted in HPI Lungs: +SOB  Past Medical History:  Diagnosis Date  . Acne    takes doxocycline daily for acne  . Anxiety   . Bronchitis    history of  . Carpal tunnel syndrome on both sides   . Chicken pox   . Depression   . Diabetes mellitus   . Headache(784.0)    hx of migraines  . Hyperlipidemia   . Sickle cell trait (HCC)   . Sleep apnea    has cpap    BP 132/68 (BP Location: Right Arm, Patient Position: Sitting, Cuff Size: Large)   Pulse (!) 105   Temp 98.1 F (36.7 C)   Resp 20   Ht 5\' 9"  (1.753 m)   Wt 262 lb (118.8 kg)   LMP 08/09/2007   SpO2 97%   BMI 38.69 kg/m  General: Awake, alert, appears stated age HEENT: AT, [place], ears patent b/l and TM's neg, nares patent w/o discharge, pharynx pink and without exudates, MMM Neck: No masses or asymmetry Heart: RRR Lungs: CTAB, no accessory muscle use Psych: Age appropriate judgment and insight, normal mood and affect  Acute bronchitis, unspecified organism - Plan: promethazine-dextromethorphan (PROMETHAZINE-DM) 6.25-15 MG/5ML syrup  Steroid inj as it does sound like a component of allergies. Send me a mc message if no better in 2 d.  Continue to push fluids, practice good hand hygiene, cover mouth when coughing. F/u prn. If starting to experience fevers, shaking, or shortness of breath, seek immediate care. Pt voiced understanding and agreement to the plan.  Jilda Rocheicholas Paul [place], DO 10/23/17 2:42 PM

## 2017-10-26 ENCOUNTER — Other Ambulatory Visit: Payer: Self-pay | Admitting: Family Medicine

## 2017-10-26 ENCOUNTER — Encounter: Payer: Self-pay | Admitting: Family Medicine

## 2017-10-26 MED ORDER — AMOXICILLIN-POT CLAVULANATE 875-125 MG PO TABS: 1.0000 | ORAL_TABLET | Freq: Two times a day (BID) | ORAL | 0 refills | Status: DC

## 2017-10-30 ENCOUNTER — Encounter: Payer: Self-pay | Admitting: Family Medicine

## 2017-11-07 ENCOUNTER — Encounter: Payer: Self-pay | Admitting: Pulmonary Disease

## 2017-11-07 ENCOUNTER — Ambulatory Visit (INDEPENDENT_AMBULATORY_CARE_PROVIDER_SITE_OTHER): Payer: 59 | Admitting: Pulmonary Disease

## 2017-11-07 VITALS — BP 124/82 | HR 101 | Ht 69.0 in | Wt 256.4 lb

## 2017-11-07 DIAGNOSIS — G4733 Obstructive sleep apnea (adult) (pediatric): Secondary | ICD-10-CM | POA: Diagnosis not present

## 2017-11-07 NOTE — Patient Instructions (Signed)
History of obstructive sleep apnea, not currently on CPAP, recent weight gain  Daytime sleepiness  We will schedule a home sleep study  Treatment options include an auto titrating CPAP  I will see you back in the office in about 2 to 3 months following initiation of treatment   Sleep Apnea Sleep apnea is a condition in which breathing pauses or becomes shallow during sleep. Episodes of sleep apnea usually last 10 seconds or longer, and they may occur as many as 20 times an hour. Sleep apnea disrupts your sleep and keeps your body from getting the rest that it needs. This condition can increase your risk of certain health problems, including:  Heart attack.  Stroke.  Obesity.  Diabetes.  Heart failure.  Irregular heartbeat.  There are three kinds of sleep apnea:  Obstructive sleep apnea. This kind is caused by a blocked or collapsed airway.  Central sleep apnea. This kind happens when the part of the brain that controls breathing does not send the correct signals to the muscles that control breathing.  Mixed sleep apnea. This is a combination of obstructive and central sleep apnea.  What are the causes? The most common cause of this condition is a collapsed or blocked airway. An airway can collapse or become blocked if:  Your throat muscles are abnormally relaxed.  Your tongue and tonsils are larger than normal.  You are overweight.  Your airway is smaller than normal.  What increases the risk? This condition is more likely to develop in people who:  Are overweight.  Smoke.  Have a smaller than normal airway.  Are elderly.  Are female.  Drink alcohol.  Take sedatives or tranquilizers.  Have a family history of sleep apnea.  What are the signs or symptoms? Symptoms of this condition include:  Trouble staying asleep.  Daytime sleepiness and tiredness.  Irritability.  Loud snoring.  Morning headaches.  Trouble  concentrating.  Forgetfulness.  Decreased interest in sex.  Unexplained sleepiness.  Mood swings.  Personality changes.  Feelings of depression.  Waking up often during the night to urinate.  Dry mouth.  Sore throat.  How is this diagnosed? This condition may be diagnosed with:  A medical history.  A physical exam.  A series of tests that are done while you are sleeping (sleep study). These tests are usually done in a sleep lab, but they may also be done at home.  How is this treated? Treatment for this condition aims to restore normal breathing and to ease symptoms during sleep. It may involve managing health issues that can affect breathing, such as high blood pressure or obesity. Treatment may include:  Sleeping on your side.  Using a decongestant if you have nasal congestion.  Avoiding the use of depressants, including alcohol, sedatives, and narcotics.  Losing weight if you are overweight.  Making changes to your diet.  Quitting smoking.  Using a device to open your airway while you sleep, such as: ? An oral appliance. This is a custom-made mouthpiece that shifts your lower jaw forward. ? A continuous positive airway pressure (CPAP) device. This device delivers oxygen to your airway through a mask. ? A nasal expiratory positive airway pressure (EPAP) device. This device has valves that you put into each nostril. ? A bi-level positive airway pressure (BPAP) device. This device delivers oxygen to your airway through a mask.  Surgery if other treatments do not work. During surgery, excess tissue is removed to create a wider airway.  It  is important to get treatment for sleep apnea. Without treatment, this condition can lead to:  High blood pressure.  Coronary artery disease.  (Men) An inability to achieve or maintain an erection (impotence).  Reduced thinking abilities.  Follow these instructions at home:  Make any lifestyle changes that your health  care provider recommends.  Eat a healthy, well-balanced diet.  Take over-the-counter and prescription medicines only as told by your health care provider.  Avoid using depressants, including alcohol, sedatives, and narcotics.  Take steps to lose weight if you are overweight.  If you were given a device to open your airway while you sleep, use it only as told by your health care provider.  Do not use any tobacco products, such as cigarettes, chewing tobacco, and e-cigarettes. If you need help quitting, ask your health care provider.  Keep all follow-up visits as told by your health care provider. This is important. Contact a health care provider if:  The device that you received to open your airway during sleep is uncomfortable or does not seem to be working.  Your symptoms do not improve.  Your symptoms get worse. Get help right away if:  You develop chest pain.  You develop shortness of breath.  You develop discomfort in your back, arms, or stomach.  You have trouble speaking.  You have weakness on one side of your body.  You have drooping in your face. These symptoms may represent a serious problem that is an emergency. Do not wait to see if the symptoms will go away. Get medical help right away. Call your local emergency services (911 in the [place] Do not drive yourself to the hospital. This information is not intended to replace advice given to you by your health care provider. Make sure you discuss any questions you have with your health care provider. Document Released: 01/07/2002 Document Revised: 09/13/2015 Document Reviewed: 10/27/2014 Elsevier Interactive Patient Education  [employer].

## 2017-11-07 NOTE — Progress Notes (Signed)
Subjective:    Patient ID: Maria Johnson, female    DOB: 1980/12/25, 37 y.o.   MRN: [id number]  Chief complaint: Daytime sleepiness Loud snoring  HPI Diagnosed with obstructive sleep apnea in 2007 Lifelong history of snoring Had tonsillectomy at 2007, had a sleep study about 6 to 7 months later however, at that time she was about 7 months pregnant She did use CPAP for a while No specific reason for discontinuing CPAP Usually goes to bed between 9 and 11 PM, falls asleep in 5 to 10 minutes, usually wakes up between 5 and 6 AM Has gained 40 pounds recently  She is sleepy during the day  No family history of OSA  Has a history of diabetes, hypercholesterolemia   Review of Systems  Constitutional: Negative for fever and unexpected weight change.  HENT: Positive for congestion, sneezing and sore throat. Negative for dental problem, ear pain, nosebleeds, postnasal drip, rhinorrhea, sinus pressure and trouble swallowing.   Eyes: Negative for redness and itching.  Respiratory: Positive for shortness of breath. Negative for cough, chest tightness and wheezing.   Cardiovascular: Negative for palpitations and leg swelling.  Gastrointestinal: Negative for nausea and vomiting.  Genitourinary: Negative for dysuria.  Musculoskeletal: Negative for joint swelling.  Skin: Negative for rash.  Allergic/Immunologic: Negative.  Negative for environmental allergies, food allergies and immunocompromised state.  Neurological: Negative for headaches.  Hematological: Does not bruise/bleed easily.  Psychiatric/Behavioral: Negative for dysphoric mood. The patient is not nervous/anxious.    Past Medical History:  Diagnosis Date  . Acne    takes doxocycline daily for acne  . Anxiety   . Bronchitis    history of  . Carpal tunnel syndrome on both sides   . Chicken pox   . Depression   . Diabetes mellitus   . Headache(784.0)    hx of migraines  . Hyperlipidemia   . Sickle cell trait (HCC)   .  Sleep apnea    has cpap   Social History   Socioeconomic History  . Marital status: Single    Spouse name: Not on file  . Number of children: 3  . Years of education: Not on file  . Highest education level: Master's degree (e.g., MA, MS, MEng, MEd, MSW, MBA)  Occupational History  . Not on file  Social Needs  . Financial resource strain: Not on file  . Food insecurity:    Worry: Not on file    Inability: Not on file  . Transportation needs:    Medical: Not on file    Non-medical: Not on file  Tobacco Use  . Smoking status: Never Smoker  . Smokeless tobacco: Never Used  Substance and Sexual Activity  . Alcohol use: Yes    Comment: occassional  . Drug use: No  . Sexual activity: Yes    Birth control/protection: Surgical  Lifestyle  . Physical activity:    Days per week: Not on file    Minutes per session: Not on file  . Stress: Not on file  Relationships  . Social connections:    Talks on phone: Not on file    Gets together: Not on file    Attends religious service: Not on file    Active member of club or organization: Not on file    Attends meetings of clubs or organizations: Not on file    Relationship status: Not on file  . Intimate partner violence:    Fear of current or ex partner: Not on  file    Emotionally abused: Not on file    Physically abused: Not on file    Forced sexual activity: Not on file  Other Topics Concern  . Not on file  Social History Narrative  . Not on file       Objective:   Physical Exam  Constitutional: She appears well-developed and well-nourished. No distress.  HENT:  Head: Normocephalic and atraumatic.  Mouth/Throat: No oropharyngeal exudate.  Eyes: Conjunctivae and EOM are normal. Right eye exhibits no discharge. Left eye exhibits no discharge.  Neck: Normal range of motion. Neck supple. No tracheal deviation present. No thyromegaly present.  Cardiovascular: Normal rate and regular rhythm.  No murmur heard. Pulmonary/Chest:  Effort normal and breath sounds normal. No stridor. No respiratory distress. She has no wheezes.  Abdominal: Soft. Bowel sounds are normal. She exhibits no distension. There is no tenderness.  Musculoskeletal: Normal range of motion. She exhibits no edema or deformity.  Skin: She is not diaphoretic.      Assessment & Plan:  .  High probability of significant sleep disordered breathing -Long-standing history of snoring, previous study was positive for significant sleep disordered breathing, tolerated CPAP in the past .  History of significant snoring -Significant weight gain recently, lifelong history of snoring .  Excessive daytime sleepiness -Likely related to sleep disordered breathing   Plan: We will order a home sleep study  Pathophysiology of sleep disordered breathing discussed  Treatment options of sleep disordered breathing discussed  Importance of weight loss and exercise discussed  I will see him back in the office in about 2-3 months

## 2017-11-17 DIAGNOSIS — G4733 Obstructive sleep apnea (adult) (pediatric): Secondary | ICD-10-CM

## 2017-11-20 ENCOUNTER — Other Ambulatory Visit: Payer: Self-pay | Admitting: *Deleted

## 2017-11-20 DIAGNOSIS — G4733 Obstructive sleep apnea (adult) (pediatric): Secondary | ICD-10-CM

## 2017-11-22 DIAGNOSIS — G4733 Obstructive sleep apnea (adult) (pediatric): Secondary | ICD-10-CM | POA: Diagnosis not present

## 2017-11-24 ENCOUNTER — Telehealth: Payer: Self-pay | Admitting: Pulmonary Disease

## 2017-11-24 DIAGNOSIS — G4733 Obstructive sleep apnea (adult) (pediatric): Secondary | ICD-10-CM

## 2017-11-24 NOTE — Telephone Encounter (Addendum)
Dr. Wynona Neat has reviewed the home sleep test this showed Severe  sleep apnea .Mild oxygen desaturation.   Recommendations   Treatment options are CPAP with the settings auto 5 to 18.    Weight loss measures .   Advise against driving while sleepy & against medication with sedative side effects.    Make appointment for 3 months  for compliance with download with Dr. Wynona Neat.   I have spoke with the patient and she verbalized understanding I have placed the order and apt made nothing further needed at this time.

## 2017-12-16 DIAGNOSIS — G4733 Obstructive sleep apnea (adult) (pediatric): Secondary | ICD-10-CM | POA: Diagnosis not present

## 2017-12-27 ENCOUNTER — Encounter: Payer: Self-pay | Admitting: Family Medicine

## 2017-12-27 ENCOUNTER — Other Ambulatory Visit: Payer: Self-pay | Admitting: Family Medicine

## 2017-12-27 ENCOUNTER — Ambulatory Visit: Payer: 59 | Admitting: Family Medicine

## 2017-12-27 VITALS — BP 120/80 | HR 82 | Temp 98.2°F | Ht 69.0 in | Wt 258.6 lb

## 2017-12-27 DIAGNOSIS — E1169 Type 2 diabetes mellitus with other specified complication: Secondary | ICD-10-CM | POA: Diagnosis not present

## 2017-12-27 DIAGNOSIS — E669 Obesity, unspecified: Secondary | ICD-10-CM | POA: Diagnosis not present

## 2017-12-27 LAB — MICROALBUMIN / CREATININE URINE RATIO
Creatinine,U: 87.6 mg/dL
Microalb Creat Ratio: 0.8 mg/g (ref 0.0–30.0)
Microalb, Ur: <0.7 mg/dL (ref 0.0–1.9)

## 2017-12-27 LAB — HEMOGLOBIN A1C: HEMOGLOBIN A1C: 7.2 % — AB (ref 4.6–6.5)

## 2017-12-27 MED ORDER — ONETOUCH ULTRA MINI W/DEVICE KIT: PACK | 0 refills | Status: DC

## 2017-12-27 MED ORDER — METFORMIN HCL ER 750 MG PO TB24: 750.0000 mg | ORAL_TABLET | Freq: Every day | ORAL | 3 refills | Status: DC

## 2017-12-27 MED ORDER — ONETOUCH ULTRASOFT LANCETS MISC: 0 refills | Status: DC

## 2017-12-27 MED ORDER — GLUCOSE BLOOD VI STRP: ORAL_STRIP | 0 refills | Status: DC

## 2017-12-27 NOTE — Patient Instructions (Signed)
Keep the diet clean and stay active.  Give us 2-3 business days to get the results of your labs back.   Aim to do some physical exertion for 150 minutes per week. This is typically divided into 5 days per week, 30 minutes per day. The activity should be enough to get your heart rate up. Anything is better than nothing if you have time constraints.  Let us know if you need anything.

## 2017-12-27 NOTE — Progress Notes (Signed)
Pre visit review using our clinic review tool, if applicable. No additional management support is needed unless otherwise documented below in the visit note. 

## 2017-12-27 NOTE — Progress Notes (Signed)
Subjective:   Chief Complaint  Patient presents with  . Follow-up    Maria Johnson is a 37 y.o. female here for follow-up of diabetes.   Sybol does not routinely check sugars. Patient does not require insulin.   Medications include: Metformin XR 500 mg/d Exercise: Walking more  Past Medical History:  Diagnosis Date  . Acne    takes doxocycline daily for acne  . Anxiety   . Bronchitis    history of  . Carpal tunnel syndrome on both sides   . Chicken pox   . Depression   . Diabetes mellitus   . Headache(784.0)    hx of migraines  . Hyperlipidemia   . Sickle cell trait (HCC)   . Sleep apnea    has cpap     Related testing: Date of retinal exam: Due Pneumovax: done Flu Shot: done  Review of Systems: Pulmonary:  No SOB Cardiovascular:  No chest pain  Objective:  BP 120/80 (BP Location: Left Arm, Patient Position: Sitting, Cuff Size: Large)   Pulse 82   Temp 98.2 F (36.8 C) (Oral)   Ht 5\' 9"  (1.753 m)   Wt 258 lb 9 oz (117.3 kg)   LMP 08/09/2007   SpO2 98%   BMI 38.18 kg/m  General:  Well developed, well nourished, in no apparent distress Skin:  Warm, no pallor or diaphoresis Head:  Normocephalic, atraumatic Eyes:  Pupils equal and round, sclera anicteric without injection  Lungs:  CTAB, no access msc use Cardio:  RRR, no bruits, no LE edema Musculoskeletal:  Symmetrical muscle groups noted without atrophy or deformity Neuro:  Sensation intact to pinprick on feet Psych: Age appropriate judgment and insight  Assessment:   Diabetes mellitus type 2 in obese (HCC) - Plan: HM DIABETES FOOT EXAM, Ambulatory referral to Ophthalmology, Microalbumin / creatinine urine ratio, Hemoglobin A1c   Plan:   Orders as above. Refer ophtho, cont Metformin pending results of A1c. Counseled on diet and exercise. F/u in 3 mo. The patient voiced understanding and agreement to the plan.  Jilda Rocheicholas Paul [place], DO 12/27/17 8:35 AM

## 2018-01-08 ENCOUNTER — Ambulatory Visit: Payer: Self-pay | Admitting: Pulmonary Disease

## 2018-01-15 DIAGNOSIS — G4733 Obstructive sleep apnea (adult) (pediatric): Secondary | ICD-10-CM | POA: Diagnosis not present

## 2018-03-02 ENCOUNTER — Encounter: Payer: Self-pay | Admitting: Pulmonary Disease

## 2018-03-02 ENCOUNTER — Ambulatory Visit (INDEPENDENT_AMBULATORY_CARE_PROVIDER_SITE_OTHER): Payer: BLUE CROSS/BLUE SHIELD | Admitting: Pulmonary Disease

## 2018-03-02 VITALS — BP 116/86 | HR 111 | Ht 69.0 in | Wt 254.0 lb

## 2018-03-02 DIAGNOSIS — Z9989 Dependence on other enabling machines and devices: Secondary | ICD-10-CM

## 2018-03-02 DIAGNOSIS — G4733 Obstructive sleep apnea (adult) (pediatric): Secondary | ICD-10-CM

## 2018-03-02 NOTE — Progress Notes (Signed)
Subjective:    Patient ID: Maria Johnson, female    DOB: 06/27/1980, 38 y.o.   MRN: [id number]  Chief complaint: Daytime sleepiness Loud snoring Diagnosed with obstructive sleep apnea-has been on CPAP therapy   HPI Diagnosed with obstructive sleep apnea in 2007 Has been on CPAP therapy, tolerating CPAP well The only time she has not used her machine is when she is taking care of family members Has no significant complaints today  Had tonsillectomy at 2007, had a sleep study about 6 to 7 months later however, at that time she was about 7 months pregnant She did use CPAP for a while No specific reason for discontinuing CPAP Usually goes to bed between 9 and 11 PM, falls asleep in 5 to 10 minutes, usually wakes up between 5 and 6 AM Has gained 40 pounds recently  No family history of OSA Has a history of diabetes, hypercholesterolemia   Review of Systems  Constitutional: Negative for fever and unexpected weight change.  HENT: Positive for congestion. Negative for dental problem, ear pain, nosebleeds, postnasal drip, rhinorrhea, sinus pressure, sneezing, sore throat and trouble swallowing.   Eyes: Negative for redness and itching.  Respiratory: Negative for cough, chest tightness, shortness of breath and wheezing.   Cardiovascular: Negative for palpitations and leg swelling.  Gastrointestinal: Negative for nausea and vomiting.   Past Medical History:  Diagnosis Date  . Acne    takes doxocycline daily for acne  . Anxiety   . Bronchitis    history of  . Carpal tunnel syndrome on both sides   . Chicken pox   . Depression   . Diabetes mellitus   . Headache(784.0)    hx of migraines  . Hyperlipidemia   . Sickle cell trait (HCC)   . Sleep apnea    has cpap   Social History   Socioeconomic History  . Marital status: Single    Spouse name: Not on file  . Number of children: 3  . Years of education: Not on file  . Highest education level: Master's degree (e.g., MA,  MS, MEng, MEd, MSW, MBA)  Occupational History  . Not on file  Social Needs  . Financial resource strain: Not on file  . Food insecurity:    Worry: Not on file    Inability: Not on file  . Transportation needs:    Medical: Not on file    Non-medical: Not on file  Tobacco Use  . Smoking status: Never Smoker  . Smokeless tobacco: Never Used  Substance and Sexual Activity  . Alcohol use: Yes    Comment: occassional  . Drug use: No  . Sexual activity: Yes    Birth control/protection: Surgical  Lifestyle  . Physical activity:    Days per week: Not on file    Minutes per session: Not on file  . Stress: Not on file  Relationships  . Social connections:    Talks on phone: Not on file    Gets together: Not on file    Attends religious service: Not on file    Active member of club or organization: Not on file    Attends meetings of clubs or organizations: Not on file    Relationship status: Not on file  . Intimate partner violence:    Fear of current or ex partner: Not on file    Emotionally abused: Not on file    Physically abused: Not on file    Forced sexual activity: Not on  file  Other Topics Concern  . Not on file  Social History Narrative  . Not on file       Objective:   Physical Exam Constitutional:      General: She is not in acute distress.    Appearance: She is well-developed. She is not diaphoretic.  HENT:     Head: Normocephalic and atraumatic.     Mouth/Throat:     Pharynx: No oropharyngeal exudate.  Eyes:     General:        Right eye: No discharge.        Left eye: No discharge.     Conjunctiva/sclera: Conjunctivae normal.  Neck:     Musculoskeletal: Normal range of motion and neck supple.     Thyroid: No thyromegaly.     Trachea: No tracheal deviation.  Cardiovascular:     Rate and Rhythm: Normal rate and regular rhythm.     Heart sounds: No murmur.  Pulmonary:     Effort: Pulmonary effort is normal. No respiratory distress.     Breath  sounds: Normal breath sounds. No stridor. No wheezing.      Compliance data is suboptimal as patient was recently taking care of family member(38 year old) and was not able to use CPAP  Assessment & Plan:  .  Obstructive sleep apnea adequately treated with CPAP therapy -Encouraged continued compliance with using CPAP on a regular basis  .  Excessive daytime sleepiness -Likely related to sleep disordered breathing, improvement in symptoms   Plan:  Pathophysiology of sleep disordered breathing discussed  Treatment options of sleep disordered breathing discussed  Importance of weight loss and exercise discussed-encouraged to continue working on weight loss  I will see her back in the office in about 6 months  Encouraged to call if she has any significant concerns

## 2018-03-02 NOTE — Patient Instructions (Signed)
Obstructive sleep apnea adequately treated with CPAP therapy.  Continue using CPAP  I will see you back in the office in about 6 months  Call with any significant concerns

## 2018-04-04 ENCOUNTER — Encounter: Payer: Self-pay | Admitting: Family Medicine

## 2018-04-04 ENCOUNTER — Ambulatory Visit: Payer: BLUE CROSS/BLUE SHIELD | Admitting: Family Medicine

## 2018-04-04 ENCOUNTER — Other Ambulatory Visit: Payer: Self-pay | Admitting: Family Medicine

## 2018-04-04 VITALS — BP 108/78 | HR 102 | Temp 98.6°F | Ht 69.0 in | Wt 253.1 lb

## 2018-04-04 DIAGNOSIS — E669 Obesity, unspecified: Secondary | ICD-10-CM

## 2018-04-04 DIAGNOSIS — E1169 Type 2 diabetes mellitus with other specified complication: Secondary | ICD-10-CM | POA: Diagnosis not present

## 2018-04-04 DIAGNOSIS — G5603 Carpal tunnel syndrome, bilateral upper limbs: Secondary | ICD-10-CM

## 2018-04-04 DIAGNOSIS — L209 Atopic dermatitis, unspecified: Secondary | ICD-10-CM

## 2018-04-04 HISTORY — DX: Carpal tunnel syndrome, bilateral upper limbs: G56.03

## 2018-04-04 HISTORY — DX: Atopic dermatitis, unspecified: L20.9

## 2018-04-04 LAB — HEMOGLOBIN A1C: Hgb A1c MFr Bld: 7.2 % — ABNORMAL HIGH (ref 4.6–6.5)

## 2018-04-04 MED ORDER — METFORMIN HCL ER 750 MG PO TB24: 750.0000 mg | ORAL_TABLET | Freq: Every day | ORAL | 2 refills | Status: DC

## 2018-04-04 MED ORDER — TRIAMCINOLONE ACETONIDE 0.5 % EX CREA: 1.0000 "application " | TOPICAL_CREAM | Freq: Two times a day (BID) | CUTANEOUS | 0 refills | Status: AC

## 2018-04-04 MED ORDER — ATORVASTATIN CALCIUM 40 MG PO TABS: 40.0000 mg | ORAL_TABLET | Freq: Every day | ORAL | 3 refills | Status: DC

## 2018-04-04 NOTE — Patient Instructions (Addendum)
Give [place] 2-3 business days to get the results of your labs back.   Keep the diet clean and stay active.  Check your sugars around 2-3 times per week. Alternate checking in the morning before you eat, in the afternoon and before bed. Write them down and bring it to your next appointment.   Use Vaseline over area on back as needed for itching/scaliness and in between uses of the steroid cream. Let me know in 10 days if no improvement.   Let us know if you need anything.

## 2018-04-04 NOTE — Progress Notes (Signed)
Subjective:   Chief Complaint  Patient presents with  . Follow-up    Maria Johnson is a 38 y.o. female here for follow-up of diabetes.   [name]'s self monitored glucose range is mid 100's. Patient denies hypoglycemic reactions. She checks her glucose levels 1 time per day. Patient does not require insulin.   Medications include: Metformin 750 mg XR daily Diet has been "pretty good". Exercise: walking, some lifting  B/l carpal tunnel has been getting worse. Using a single wrist brace that helps a little, but she has to alternate them.   Past Medical History:  Diagnosis Date  . Acne    takes doxocycline daily for acne  . Anxiety   . Bronchitis    history of  . Carpal tunnel syndrome on both sides   . Chicken pox   . Depression   . Diabetes mellitus   . Headache(784.0)    hx of migraines  . Hyperlipidemia   . Sickle cell trait (HCC)   . Sleep apnea    has cpap     Related testing: Date of retinal exam: Done Pneumovax: done Flu Shot: done  Review of Systems: Pulmonary:  No SOB Cardiovascular:  No chest pain  Objective:  BP 108/78 (BP Location: Left Arm, Patient Position: Sitting, Cuff Size: Large)   Pulse (!) 102   Temp 98.6 F (37 C) (Oral)   Ht 5\' 9"  (1.753 m)   Wt 253 lb 2 oz (114.8 kg)   LMP 08/09/2007   SpO2 97%   BMI 37.38 kg/m  General:  Well developed, well nourished, in no apparent distress Skin:  On central portion of back around T6-10, there is a hyperpigmented and scaly lesion Head:  Normocephalic, atraumatic Eyes:  Pupils equal and round, sclera anicteric without injection  Lungs:  CTAB, no access msc use Cardio:  RRR, no bruits, no LE edema Psych: Age appropriate judgment and insight  Assessment:   Diabetes mellitus type 2 in obese (HCC) - Plan: Hemoglobin A1c  Bilateral carpal tunnel syndrome  Atopic dermatitis, unspecified type - Plan: triamcinolone cream (KENALOG) 0.5 %   Plan:   Orders as above. Counseled on diet and  exercise. If no improvement, will change to Metformin. Second wrist splint given. If no improvement, will do injections. EMG proven in past.  Higher potency steroid.  F/u in 6 mo. The patient voiced understanding and agreement to the plan.  Jilda Roche [place], DO 04/04/18 8:50 AM

## 2018-04-12 ENCOUNTER — Other Ambulatory Visit: Payer: Self-pay | Admitting: Family Medicine

## 2018-04-12 NOTE — Telephone Encounter (Signed)
Last refill 08/24/2017   #30 no refills Last office visit on 04/04/2018

## 2018-04-16 DIAGNOSIS — G4733 Obstructive sleep apnea (adult) (pediatric): Secondary | ICD-10-CM | POA: Diagnosis not present

## 2018-04-17 ENCOUNTER — Encounter: Payer: Self-pay | Admitting: Family Medicine

## 2018-05-17 DIAGNOSIS — G4733 Obstructive sleep apnea (adult) (pediatric): Secondary | ICD-10-CM | POA: Diagnosis not present

## 2018-05-24 ENCOUNTER — Ambulatory Visit (INDEPENDENT_AMBULATORY_CARE_PROVIDER_SITE_OTHER): Payer: BLUE CROSS/BLUE SHIELD | Admitting: Medical

## 2018-05-24 ENCOUNTER — Encounter: Payer: Self-pay | Admitting: Family Medicine

## 2018-05-24 ENCOUNTER — Other Ambulatory Visit: Payer: Self-pay

## 2018-05-24 ENCOUNTER — Encounter: Payer: Self-pay | Admitting: Medical

## 2018-05-24 ENCOUNTER — Telehealth: Payer: BLUE CROSS/BLUE SHIELD | Admitting: Physician Assistant

## 2018-05-24 VITALS — BP 117/82

## 2018-05-24 DIAGNOSIS — R112 Nausea with vomiting, unspecified: Secondary | ICD-10-CM

## 2018-05-24 DIAGNOSIS — H53149 Visual discomfort, unspecified: Secondary | ICD-10-CM

## 2018-05-24 DIAGNOSIS — G43809 Other migraine, not intractable, without status migrainosus: Secondary | ICD-10-CM

## 2018-05-24 MED ORDER — BUTALBITAL-ASA-CAFF-CODEINE 50-325-40-30 MG PO CAPS: 1.0000 | ORAL_CAPSULE | Freq: Four times a day (QID) | ORAL | 0 refills | Status: DC | PRN

## 2018-05-24 MED ORDER — ONDANSETRON 8 MG PO TBDP: 8.0000 mg | ORAL_TABLET | Freq: Three times a day (TID) | ORAL | 0 refills | Status: DC | PRN

## 2018-05-24 NOTE — Progress Notes (Signed)
   Subjective:    Patient ID: Maria Johnson, female    DOB: September 19, 1980, 38 y.o.   MRN: [id number]  HPI  Virtual Visit via Video Note  I connected with [name] on 05/24/18 at  1:40 PM EDT by a video enabled telemedicine application and verified that I am speaking with the correct person using two identifiers.   I discussed the limitations of evaluation and management by telemedicine and the availability of in person appointments. The patient expressed understanding and agreed to proceed.  Sugar was 118 today. BP was not high.  History of Present Illness:   Pt in states for past 2.5 days has had migraine. She has long hx of migraines. She has light sensitivity. Some loud sensitvity. More light sensitivity. She has nausea and vomiting. Pt states when younger had 3 migraine ha a month. Last 5 years about 4-5 ha in whole year.  LMP-partial hysterectomy in 2009.  Pt has tried alleve, tylenol and tylenol arthritis. Nothing has helped.   Observations/Objective: No acute distress(brief video visit before failure. Appears gross motor function intact)  Assessment and Plan: Patient does have history of migraines and for the last 2 and half days describes recurrent migraine type signs and symptoms.  Did prescribe Fiorinal with codeine for headache.  Also provided Zofran for nausea for nausea and vomiting.  Advised patient to give me an update tomorrow morning whether the headache resolves or not.  If headache persist then explained we could have the patient come to the office for Toradol injection.  Also explained to patient that if she has worsening headache or motor/sensory deficits then recommend ED evaluation tonight.  Patient expressed understanding.  Will wait for patient's update tomorrow a.m.  Follow-up date to be determined based on her response to the above treatment.  Follow Up Instructions:    I discussed the assessment and treatment plan with the patient. The patient was  provided an opportunity to ask questions and all were answered. The patient agreed with the plan and demonstrated an understanding of the instructions.   The patient was advised to call back or seek an in-person evaluation if the symptoms worsen or if the condition fails to improve as anticipated.     Esperanza Richters, PA-C    Review of Systems  Constitutional: Negative for chills.  Eyes: Positive for photophobia.  Respiratory: Negative for cough, chest tightness, shortness of breath and wheezing.   Cardiovascular: Negative for chest pain and palpitations.  Musculoskeletal: Negative for back pain, neck pain and neck stiffness.  Neurological: Negative for dizziness, syncope, speech difficulty and numbness.  Hematological: Negative for adenopathy. Does not bruise/bleed easily.  Psychiatric/Behavioral: Negative for behavioral problems, confusion, hallucinations and sleep disturbance. The patient is not nervous/anxious.        Objective:   Physical Exam        Assessment & Plan:

## 2018-05-24 NOTE — Progress Notes (Signed)
Based on what you shared with me, I feel your condition warrants further evaluation and I recommend that you be seen for a face to face office visit (video visit) with your Primary Care Provider. Giving migraine along with current symptoms, would recommend you do a video visit as they can do a neurological examination to make sure no further assessment is needed and so you can be treated for the nausea/vomiting plus the headache itself (which I am not authorized to diagnose/treat via e-visit). Call Dr. Hollie Beach office at [phone] to schedule an appointment.     NOTE: If you entered your credit card information for this eVisit, you will not be charged. You may see a "hold" on your card for the $35 but that hold will drop off and you will not have a charge processed.  If you are having a true medical emergency please call 911.  If you need an urgent face to face visit, [place] has four urgent care centers for your convenience.    PLEASE NOTE: THE [facility] LOCATIONS AND URGENT CARE CLINICS DO NOT HAVE THE TESTING FOR CORONAVIRUS COVID19 AVAILABLE.  IF YOU FEEL YOU NEED THIS TEST YOU MUST GO TO A TRIAGE LOCATION AT ONE OF THE HOSPITAL EMERGENCY DEPARTMENTS   [email or web address] to reserve your spot online an avoid wait times  [facility] [address] [place], [place] [address] Modified hours of operation: Monday-Friday, 10 AM to 6 PM  Saturday & Sunday 10 AM to 4 PM *Across the street from [employer]  [employer] [employer] (New Address!) [address] [place], [place] [address] *Just off [employer], across the road from [place]* Modified hours of operation: Monday-Friday, 10 AM to 5 PM  Closed Saturday & Sunday   The following sites will take your insurance:  . [facility]  [phone] Get Driving Directions Find a Provider at this Location  [address] [place], [place] [address] . 10 am  to 8 pm Monday-Friday . 12 pm to 8 pm Saturday-Sunday   . [facility]  [phone] Get Driving Directions Find a Provider at this Location  [address], [address] [place], [place] [address] . 8 am to 8 pm Monday-Friday . 9 am to 6 pm Saturday . 11 am to 6 pm Sunday   . [facility] at [facility]  [phone] Get Driving Directions  [address] [place], [place] [address] . 8 am to 8 pm Monday-Friday . 8 am to 4 pm Saturday-Sunday   Your e-visit answers were reviewed by a board certified advanced clinical practitioner to complete your personal care plan.  Thank you for using e-Visits.

## 2018-05-24 NOTE — Patient Instructions (Signed)
Patient does have history of migraines and for the last 2 and half days describes recurrent migraine type signs and symptoms.  Did prescribe Fiorinal with codeine for headache.  Also provided Zofran for nausea for nausea and vomiting.  Advised patient to give me an update tomorrow morning whether the headache resolves or not.  If headache persist then explained we could have the patient come to the office for Toradol injection.  Also explained to patient that if she has worsening headache or motor/sensory deficits then recommend ED evaluation tonight.  Patient expressed understanding.  Will wait for patient's update tomorrow a.m.  Follow-up date to be determined based on her response to the above treatment.

## 2018-05-25 ENCOUNTER — Telehealth: Payer: Self-pay | Admitting: Medical

## 2018-05-25 ENCOUNTER — Telehealth: Payer: Self-pay

## 2018-05-25 MED ORDER — BUTALBITAL-ASA-CAFF-CODEINE 50-325-40-30 MG PO CAPS: 1.0000 | ORAL_CAPSULE | Freq: Four times a day (QID) | ORAL | 0 refills | Status: DC | PRN

## 2018-05-25 NOTE — Telephone Encounter (Signed)
Copied from CRM [id number]. Topic: General - Other >> May 24, 2018  3:15 PM Reggie Pile, [place] wrote: Reason for CRM: Patient called in stating the medication, butalbital-aspirin-caffeine-codeine (FIORINAL WITH CODEINE) 50-325-40-30 MG capsule, is going to need be sent to [facility] on [address], [place], [place] [address], due to current pharmacy being out of stock. Call back number is [phone].

## 2018-05-25 NOTE — Telephone Encounter (Signed)
Sent to her requested pharmacy

## 2018-05-25 NOTE — Telephone Encounter (Signed)
Resent med to different pharmacy since other [facility] did not have fiornal

## 2018-06-01 ENCOUNTER — Ambulatory Visit (INDEPENDENT_AMBULATORY_CARE_PROVIDER_SITE_OTHER): Payer: BLUE CROSS/BLUE SHIELD | Admitting: Family Medicine

## 2018-06-01 ENCOUNTER — Other Ambulatory Visit: Payer: Self-pay

## 2018-06-01 ENCOUNTER — Encounter: Payer: Self-pay | Admitting: Family Medicine

## 2018-06-01 DIAGNOSIS — M654 Radial styloid tenosynovitis [de Quervain]: Secondary | ICD-10-CM | POA: Diagnosis not present

## 2018-06-01 MED ORDER — PREDNISONE 20 MG PO TABS: 40.0000 mg | ORAL_TABLET | Freq: Every day | ORAL | 0 refills | Status: AC

## 2018-06-01 NOTE — Progress Notes (Addendum)
Musculoskeletal Exam  Patient: Maria Johnson DOB: [birth year]  DOS: 06/01/2018  SUBJECTIVE:  Chief Complaint:   Chief Complaint  Patient presents with  . Wrist Pain    Maria Johnson is a 38 y.o.  female for evaluation and treatment of R wrist pain. Due to COVID-19 pandemic, we are interacting via web portal for an electronic face-to-face visit. I verified patient's ID using 2 identifiers. Patient agreed to proceed with visit via this method. Patient is at home, I am at office. Patient and I are present for visit.   Onset:  2 days ago. No inj or change in activity.  Location: base of R thumb Character:  aching and sharp  Progression of issue:  is unchanged Associated symptoms: popping of tendon, decreased ROM She does type a lot for a living Treatment: to date has been rest, splinting and OTC NSAIDS.   Neurovascular symptoms: no  ROS: Musculoskeletal/Extremities: +R wrist pain  Past Medical History:  Diagnosis Date  . Acne    takes doxocycline daily for acne  . Anxiety   . Bronchitis    history of  . Carpal tunnel syndrome on both sides   . Chicken pox   . Depression   . Diabetes mellitus   . Headache(784.0)    hx of migraines  . Hyperlipidemia   . Sickle cell trait (HCC)   . Sleep apnea    has cpap    Objective: No conversational dyspnea Age appropriate judgment and insight Nml affect and mood +Finkelstein's on R  Assessment:  De Quervain's tenosynovitis, right - Plan: predniSONE (DELTASONE) 20 MG tablet  Plan: Ice, Tylenol, pred burst, thumb spica splint, stretches/exercises. F/u in 2-3 weeks in person if no better. The patient voiced understanding and agreement to the plan.   Jilda Roche [place], DO 06/01/18  10:00 AM

## 2018-06-16 DIAGNOSIS — G4733 Obstructive sleep apnea (adult) (pediatric): Secondary | ICD-10-CM | POA: Diagnosis not present

## 2018-06-20 ENCOUNTER — Encounter: Payer: Self-pay | Admitting: Family Medicine

## 2018-06-22 ENCOUNTER — Ambulatory Visit: Payer: BLUE CROSS/BLUE SHIELD | Admitting: Family Medicine

## 2018-06-22 ENCOUNTER — Other Ambulatory Visit: Payer: Self-pay

## 2018-06-22 ENCOUNTER — Encounter: Payer: Self-pay | Admitting: Family Medicine

## 2018-06-22 VITALS — BP 120/80 | HR 97 | Temp 98.2°F | Ht 69.0 in | Wt 250.2 lb

## 2018-06-22 DIAGNOSIS — M654 Radial styloid tenosynovitis [de Quervain]: Secondary | ICD-10-CM | POA: Diagnosis not present

## 2018-06-22 MED ORDER — METHYLPREDNISOLONE ACETATE 40 MG/ML IJ SUSP
20.0000 mg | Freq: Once | INTRAMUSCULAR | Status: AC
  Administered 2018-06-22: 20 mg via INTRA_ARTICULAR

## 2018-06-22 NOTE — Addendum Note (Signed)
Addended by: Scharlene Gloss B on: 06/22/2018 10:47 AM   Modules accepted: Orders

## 2018-06-22 NOTE — Patient Instructions (Addendum)
Let me know how things are going in about a week. That will dictate our next steps.   Ice/cold pack over area for 10-15 min twice daily.  OK to take Tylenol 1000 mg (2 extra strength tabs) or 975 mg (3 regular strength tabs) every 6 hours as needed.  Stay in the brace.  Let us know if you need anything.

## 2018-06-22 NOTE — Progress Notes (Signed)
Musculoskeletal Exam  Patient: Maria Johnson DOB: [birth year]  DOS: 06/22/2018  SUBJECTIVE:  Chief Complaint:   Chief Complaint  Patient presents with  . Hand Pain    right thumb    Maria Johnson is a 38 y.o.  female for evaluation and treatment of R thumb pain.   Onset:  3 weeks ago. No inj or change in activity.  Location: Lateral thumb Character:  aching and sharp  Progression of issue:  is unchanged Associated symptoms: Pain with movement Treatment: to date has been rest, ice, prescription NSAIDS, home exercises and thumb spica splint.   Neurovascular symptoms: no  ROS: Musculoskeletal/Extremities: +wrist/thumb pain  Past Medical History:  Diagnosis Date  . Acne    takes doxocycline daily for acne  . Anxiety   . Bronchitis    history of  . Carpal tunnel syndrome on both sides   . Chicken pox   . Depression   . Diabetes mellitus   . Headache(784.0)    hx of migraines  . Hyperlipidemia   . Sickle cell trait (HCC)   . Sleep apnea    has cpap    Objective: VITAL SIGNS: BP 120/80 (BP Location: Left Arm, Patient Position: Sitting, Cuff Size: Large)   Pulse 97   Temp 98.2 F (36.8 C) (Oral)   Ht 5\' 9"  (1.753 m)   Wt 250 lb 4 oz (113.5 kg)   LMP 08/09/2007   SpO2 96%   BMI 36.96 kg/m  Constitutional: Well formed, well developed. No acute distress. Musculoskeletal: Wrist.   Normal active range of motion: no.   Normal passive range of motion: no Tenderness to palpation: Yes, over ext pollicus brev Deformity: no Ecchymosis: no Tests positive: Finkelstein's Tests negative: None Neurologic: Normal sensory function. No focal deficits noted. Psychiatric: Normal mood. Age appropriate judgment and insight. Alert & oriented x 3.    Procedure Note: De Quervain's tendon injection Verbal consent obtained. The tendon of interest was palpated and marked with a pen. It was cleaned with alcohol and freeze spray was used. A 27-gauge needle was used to go just  above the tendon in a near parallel fashion as the tendon was bathed in the below mixture. 20 mg of Depomedrol with 0.5 mL of 2% lidocaine w/o was injected. A bandaid was placed.  The patient tolerated the procedure well. There were no complications noted.   Assessment:  De Quervain's tenosynovitis, right - Plan: PR INJECT TENDON SHEATH/LIGAMENT  Plan: Cont stretches/exercises, wearing brace, using Tylenol and ice. Activity as tolerated. Send message in 1 week. If no better, will refer to Hand. If somewhat better, can either stay course or start OT.  F/u prn for now. The patient voiced understanding and agreement to the plan.   Jilda Roche [place], DO 06/22/18  10:20 AM

## 2018-06-29 ENCOUNTER — Encounter: Payer: Self-pay | Admitting: Family Medicine

## 2018-07-06 ENCOUNTER — Encounter: Payer: Self-pay | Admitting: Family Medicine

## 2018-07-06 ENCOUNTER — Ambulatory Visit: Payer: BC Managed Care – PPO | Admitting: Family Medicine

## 2018-07-06 ENCOUNTER — Other Ambulatory Visit: Payer: Self-pay | Admitting: Family Medicine

## 2018-07-06 ENCOUNTER — Other Ambulatory Visit (HOSPITAL_COMMUNITY)
Admission: RE | Admit: 2018-07-06 | Discharge: 2018-07-06 | Disposition: A | Discharge status: Home / Self Care | Payer: BC Managed Care – PPO | Source: Ambulatory Visit | Attending: Family Medicine | Admitting: Family Medicine

## 2018-07-06 ENCOUNTER — Other Ambulatory Visit: Payer: Self-pay

## 2018-07-06 VITALS — BP 122/80 | HR 91 | Temp 98.6°F | Ht 69.0 in | Wt 248.4 lb

## 2018-07-06 DIAGNOSIS — R7303 Prediabetes: Secondary | ICD-10-CM

## 2018-07-06 DIAGNOSIS — Z113 Encounter for screening for infections with a predominantly sexual mode of transmission: Secondary | ICD-10-CM | POA: Diagnosis not present

## 2018-07-06 DIAGNOSIS — Z114 Encounter for screening for human immunodeficiency virus [HIV]: Secondary | ICD-10-CM

## 2018-07-06 DIAGNOSIS — R739 Hyperglycemia, unspecified: Secondary | ICD-10-CM

## 2018-07-06 DIAGNOSIS — E669 Obesity, unspecified: Secondary | ICD-10-CM | POA: Diagnosis not present

## 2018-07-06 DIAGNOSIS — E1169 Type 2 diabetes mellitus with other specified complication: Secondary | ICD-10-CM | POA: Diagnosis not present

## 2018-07-06 LAB — HEMOGLOBIN A1C: Hgb A1c MFr Bld: 7.9 % — ABNORMAL HIGH (ref 4.6–6.5)

## 2018-07-06 NOTE — Progress Notes (Signed)
Subjective:   Chief Complaint  Patient presents with  . Follow-up    Maria Johnson is a 38 y.o. female here for follow-up of diabetes.   Maria Johnson's self monitored glucose range is low 100's.  Patient denies hypoglycemic reactions. She checks her glucose levels 2 times per week. Patient does not require insulin.   Medications include: Metfromin XR 750 mg/d Diet is "pretty good" Exercise: Some walking  Current ex had been cheating on her. Has no s/s's. Would like to be screened for STI's.   Past Medical History:  Diagnosis Date  . Acne    takes doxocycline daily for acne  . Anxiety   . Bronchitis    history of  . Carpal tunnel syndrome on both sides   . Chicken pox   . Depression   . Diabetes mellitus   . Headache(784.0)    hx of migraines  . Hyperlipidemia   . Sickle cell trait (HCC)   . Sleep apnea    has cpap     Review of Systems: Pulmonary:  No SOB Cardiovascular:  No chest pain  Objective:  BP 122/80 (BP Location: Left Arm, Patient Position: Sitting, Cuff Size: Large)   Pulse 91   Temp 98.6 F (37 C) (Oral)   Ht 5\' 9"  (1.753 m)   Wt 248 lb 6 oz (112.7 kg)   LMP 08/09/2007   SpO2 94%   BMI 36.68 kg/m  General:  Well developed, well nourished, in no apparent distress Skin:  Warm, no pallor or diaphoresis Head:  Normocephalic, atraumatic Eyes:  Pupils equal and round, sclera anicteric without injection  Lungs:  CTAB, no access msc use Cardio:  RRR, no bruits, no LE edema Musculoskeletal:  Symmetrical muscle groups noted without atrophy or deformity Psych: Age appropriate judgment and insight  Assessment:   Diabetes mellitus type 2 in obese (HCC) - Plan: Hemoglobin A1c, CANCELED: Hemoglobin A1c  Routine screening for STI (sexually transmitted infection) - Plan: Urine cytology ancillary only([place])  Screening for HIV (human immunodeficiency virus) - Plan: HIV Antibody (routine testing w rflx)   Plan:   Orders as above. She has lost more  wt, sounds like home readings are promising. Will have her ck her glucometer if still not controlled.  Ck above. No s/s's.  Counseled on diet and exercise. F/u in 6 mo. The patient voiced understanding and agreement to the plan.  Jilda Roche [place], DO 07/06/18 8:26 AM

## 2018-07-06 NOTE — Patient Instructions (Signed)
Keep the diet clean and stay active.  Give [place] 2-3 business days to get the results of your labs back.   If the A1c looks good, I will see you in 6 months. You don't have to check your blood sugars if controlled. If it is not where I want it to be, I will see you in 3 months.   Let us know if you need anything.

## 2018-07-07 LAB — HIV ANTIBODY (ROUTINE TESTING W REFLEX): HIV 1&2 Ab, 4th Generation: NONREACTIVE

## 2018-07-09 ENCOUNTER — Other Ambulatory Visit: Payer: BC Managed Care – PPO

## 2018-07-09 LAB — URINE CYTOLOGY ANCILLARY ONLY
Chlamydia: NEGATIVE
Neisseria Gonorrhea: NEGATIVE
Trichomonas: NEGATIVE

## 2018-07-12 ENCOUNTER — Other Ambulatory Visit (INDEPENDENT_AMBULATORY_CARE_PROVIDER_SITE_OTHER): Payer: BC Managed Care – PPO

## 2018-07-12 ENCOUNTER — Other Ambulatory Visit: Payer: Self-pay

## 2018-07-12 DIAGNOSIS — R739 Hyperglycemia, unspecified: Secondary | ICD-10-CM

## 2018-07-12 NOTE — Addendum Note (Signed)
Addended by: Kelle Darting A on: 07/12/2018 02:54 PM   Modules accepted: Orders

## 2018-07-13 LAB — BASIC METABOLIC PANEL
BUN: 10 mg/dL (ref 6–23)
CO2: 23 mEq/L (ref 19–32)
Calcium: 9.1 mg/dL (ref 8.4–10.5)
Chloride: 107 mEq/L (ref 96–112)
Creatinine, Ser: 1.26 mg/dL — ABNORMAL HIGH (ref 0.40–1.20)
GFR: 57.45 mL/min — ABNORMAL LOW (ref >60.00)
Glucose, Bld: 92 mg/dL (ref 70–99)
Potassium: 3.9 mEq/L (ref 3.5–5.1)
Sodium: 140 mEq/L (ref 135–145)

## 2018-07-17 DIAGNOSIS — G4733 Obstructive sleep apnea (adult) (pediatric): Secondary | ICD-10-CM | POA: Diagnosis not present

## 2018-08-16 DIAGNOSIS — G4733 Obstructive sleep apnea (adult) (pediatric): Secondary | ICD-10-CM | POA: Diagnosis not present

## 2018-08-21 ENCOUNTER — Encounter: Payer: Self-pay | Admitting: Family Medicine

## 2018-10-05 ENCOUNTER — Encounter: Payer: Self-pay | Admitting: Family Medicine

## 2018-10-05 ENCOUNTER — Other Ambulatory Visit: Payer: Self-pay

## 2018-10-05 ENCOUNTER — Ambulatory Visit (HOSPITAL_BASED_OUTPATIENT_CLINIC_OR_DEPARTMENT_OTHER)
Admission: RE | Admit: 2018-10-05 | Discharge: 2018-10-05 | Disposition: A | Discharge status: Home / Self Care | Payer: BC Managed Care – PPO | Source: Ambulatory Visit | Attending: Family Medicine | Admitting: Family Medicine

## 2018-10-05 ENCOUNTER — Ambulatory Visit: Payer: BC Managed Care – PPO | Admitting: Family Medicine

## 2018-10-05 VITALS — BP 118/80 | HR 107 | Temp 96.9°F | Ht 69.5 in | Wt 244.0 lb

## 2018-10-05 DIAGNOSIS — M25562 Pain in left knee: Secondary | ICD-10-CM | POA: Diagnosis not present

## 2018-10-05 DIAGNOSIS — S8992XA Unspecified injury of left lower leg, initial encounter: Secondary | ICD-10-CM | POA: Diagnosis not present

## 2018-10-05 MED ORDER — KETOROLAC TROMETHAMINE 60 MG/2ML IM SOLN
60.0000 mg | Freq: Once | INTRAMUSCULAR | Status: AC
  Administered 2018-10-05: 60 mg via INTRAMUSCULAR

## 2018-10-05 MED ORDER — MELOXICAM 15 MG PO TABS: 15.0000 mg | ORAL_TABLET | Freq: Every day | ORAL | 0 refills | Status: DC

## 2018-10-05 MED ORDER — TRAMADOL HCL 50 MG PO TABS: 50.0000 mg | ORAL_TABLET | Freq: Three times a day (TID) | ORAL | 0 refills | Status: AC | PRN

## 2018-10-05 NOTE — Progress Notes (Signed)
Musculoskeletal Exam  Patient: Maria Johnson DOB: 22-Feb-1980  DOS: 10/05/2018  SUBJECTIVE:  Chief Complaint:   Chief Complaint  Patient presents with  . Fall  . Knee Injury    left    Maria Johnson is a 38 y.o.  female for evaluation and treatment of L knee pain.   Onset:  1 day ago. Fell after being pulled by a pit bull Location: Medial knee Character:  aching, dull and sharp  Progression of issue:  is unchanged Associated symptoms: swelling Treatment: to date has been ice, Biofreeze, heat, NSAIDs, Tylenol Neurovascular symptoms: no  ROS: Musculoskeletal/Extremities: +L knee pain  Past Medical History:  Diagnosis Date  . Acne    takes doxocycline daily for acne  . Anxiety   . Bronchitis    history of  . Carpal tunnel syndrome on both sides   . Chicken pox   . Depression   . Diabetes mellitus   . Headache(784.0)    hx of migraines  . Hyperlipidemia   . Sickle cell trait ([place])   . Sleep apnea    has cpap    Objective: VITAL SIGNS: BP 118/80 (BP Location: Left Arm, Patient Position: Sitting, Cuff Size: Large)   Pulse (!) 107   Temp (!) 96.9 F (36.1 C) (Temporal)   Ht 5' 9.5" (1.765 m)   Wt 244 lb (110.7 kg)   LMP 08/09/2007   SpO2 96%   BMI 35.52 kg/m  Constitutional: Well formed, well developed. No acute distress. Thorax & Lungs: No accessory muscle use Musculoskeletal: L knee.   Normal active range of motion: no.   Normal passive range of motion: no Tenderness to palpation: medial Jt line and distal medial femur Deformity: no Ecchymosis: no Tests positive: none Tests negative: Stine's, varus/valgus, ant/posterior drawer, pat app/grind Neurologic: Normal sensory function. Psychiatric: Normal mood. Age appropriate judgment and insight. Alert & oriented x 3.    Assessment:  Injury of left knee, initial encounter - Plan: meloxicam (MOBIC) 15 MG tablet, traMADol (ULTRAM) 50 MG tablet, DG Knee Complete 4 Views Left  Plan: Orders as above.  Toradol and crutches today, start NSAID tomorrow. Ice. Activity prn.  F/u prn. The patient voiced understanding and agreement to the plan.   [place], DO 10/05/18  10:35 AM

## 2018-10-05 NOTE — Patient Instructions (Signed)
Ice/cold pack over area for 10-15 min twice daily.  OK to take Tylenol 1000 mg (2 extra strength tabs) or 975 mg (3 regular strength tabs) every 6 hours as needed.  Do not drink alcohol, do any illicit/street drugs, drive or do anything that requires alertness while on this medicine.   We will be in touch with the results of your X-ray.  Let us know if you need anything.

## 2018-10-05 NOTE — Addendum Note (Signed)
Addended by: Sharon Seller B on: 10/05/2018 10:51 AM   Modules accepted: Orders

## 2018-10-26 ENCOUNTER — Encounter: Payer: Self-pay | Admitting: Family Medicine

## 2018-10-29 ENCOUNTER — Other Ambulatory Visit: Payer: Self-pay | Admitting: Family Medicine

## 2018-10-29 DIAGNOSIS — S8992XA Unspecified injury of left lower leg, initial encounter: Secondary | ICD-10-CM

## 2018-10-30 ENCOUNTER — Other Ambulatory Visit: Payer: Self-pay

## 2018-10-30 ENCOUNTER — Ambulatory Visit: Payer: BC Managed Care – PPO | Admitting: Family Medicine

## 2018-10-30 ENCOUNTER — Encounter: Payer: Self-pay | Admitting: Family Medicine

## 2018-10-30 ENCOUNTER — Ambulatory Visit: Payer: Self-pay

## 2018-10-30 VITALS — BP 118/72 | Ht 69.5 in | Wt 239.0 lb

## 2018-10-30 DIAGNOSIS — S83412A Sprain of medial collateral ligament of left knee, initial encounter: Secondary | ICD-10-CM | POA: Insufficient documentation

## 2018-10-30 DIAGNOSIS — M25562 Pain in left knee: Secondary | ICD-10-CM

## 2018-10-30 HISTORY — DX: Sprain of medial collateral ligament of left knee, initial encounter: S83.412A

## 2018-10-30 MED ORDER — PENNSAID 2 % TD SOLN: 1.0000 "application " | Freq: Two times a day (BID) | TRANSDERMAL | 3 refills | Status: DC

## 2018-10-30 NOTE — Progress Notes (Signed)
Maria Johnson - 38 y.o. female MRN [id number]  Date of birth: April 28, 1980  SUBJECTIVE:  Including CC & ROS.  No chief complaint on file.   Maria Johnson is a 38 y.o. female that is presenting with left knee pain.  The pain initially occurred on 9/4.  Has been having pain in the medial compartment and medial femoral condyle since that time.  She has tried a compression sleeve which seems to help.  Tried a knee brace that she had from the pharmacy which does not seem to help.  Seems to be worse with prolonged walking.  No significant swelling.  No history of surgery on the knee.  Has had surgery on that left ankle.  Pain is moderate in severity.  Denies any mechanical symptoms.  She landed on that side of the knee when she initially was hurt..  Independent review of the left knee x-ray from 9/4 shows no acute abnormality.   Review of Systems  Constitutional: Negative for fever.  HENT: Negative for congestion.   Respiratory: Negative for cough.   Cardiovascular: Negative for chest pain.  Gastrointestinal: Negative for abdominal pain.  Musculoskeletal: Positive for gait problem.  Skin: Negative for color change.  Neurological: Negative for weakness.  Hematological: Negative for adenopathy.    HISTORY: Past Medical, Surgical, Social, and Family History Reviewed & Updated per EMR.   Pertinent Historical Findings include:  Past Medical History:  Diagnosis Date  . Acne    takes doxocycline daily for acne  . Anxiety   . Bronchitis    history of  . Carpal tunnel syndrome on both sides   . Chicken pox   . Depression   . Diabetes mellitus   . Headache(784.0)    hx of migraines  . Hyperlipidemia   . Sickle cell trait ([place])   . Sleep apnea    has cpap    Past Surgical History:  Procedure Laterality Date  . ANKLE SURGERY  05/2003   torn cartilage  repair; left  . ANTERIOR LUMBAR FUSION  01/06/2011   Procedure: ANTERIOR LUMBAR FUSION 1 LEVEL;  Surgeon: Peggyann Shoals, MD;   Location: [facility];  Service: Neurosurgery;  Laterality: N/A;  Lumbar five-Sacral one  Anterior Lumbar Interbody Fusion with Instrumentation   . BACK SURGERY     Laminectomy Sept 14, 2011,  . DIAGNOSTIC LAPAROSCOPY  01/2002   removed scar tissue  . LAMINECTOMY AND MICRODISCECTOMY LUMBAR SPINE  10/2009   L4, L5, S1  . LUMBAR FUSION  01/06/11   L5-S1  . TONSILLECTOMY  03/2005  . VAGINAL HYSTERECTOMY  08/2007   partial; AUB    No Known Allergies  Family History  Problem Relation Age of Onset  . Miscarriages / [place] Mother   . Hypertension Mother   . Alcohol abuse Father   . Depression Father   . Drug abuse Father   . Hyperlipidemia Father   . Stroke Father   . Depression Daughter   . Diabetes Daughter   . Hypertension Daughter   . Miscarriages / [place] Daughter   . Birth defects Son   . Diabetes Maternal Grandmother   . Heart attack Maternal Grandmother   . Hypertension Maternal Grandmother   . Stroke Maternal Grandmother   . Diabetes Maternal Grandfather   . Hypertension Maternal Grandfather   . Diabetes Paternal Grandmother   . Lupus Paternal Grandmother      Social History   Socioeconomic History  . Marital status: Single  Spouse name: Not on file  . Number of children: 3  . Years of education: Not on file  . Highest education level: Master's degree (e.g., MA, MS, MEng, MEd, MSW, MBA)  Occupational History  . Not on file  Social Needs  . Financial resource strain: Not on file  . Food insecurity    Worry: Not on file    Inability: Not on file  . Transportation needs    Medical: Not on file    Non-medical: Not on file  Tobacco Use  . Smoking status: Never Smoker  . Smokeless tobacco: Never Used  Substance and Sexual Activity  . Alcohol use: Yes    Comment: occassional  . Drug use: No  . Sexual activity: Yes    Birth control/protection: Surgical  Lifestyle  . Physical activity    Days per week: Not on file    Minutes per session: Not on  file  . Stress: Not on file  Relationships  . Social Musician on phone: Not on file    Gets together: Not on file    Attends religious service: Not on file    Active member of club or organization: Not on file    Attends meetings of clubs or organizations: Not on file    Relationship status: Not on file  . Intimate partner violence    Fear of current or ex partner: Not on file    Emotionally abused: Not on file    Physically abused: Not on file    Forced sexual activity: Not on file  Other Topics Concern  . Not on file  Social History Narrative  . Not on file     PHYSICAL EXAM:  VS: BP 118/72   Ht 5' 9.5" (1.765 m)   Wt 239 lb (108.4 kg)   LMP 08/09/2007   BMI 34.79 kg/m  Physical Exam Gen: NAD, alert, cooperative with exam, well-appearing ENT: normal lips, normal nasal mucosa,  Eye: normal EOM, normal conjunctiva and lids CV:  no edema, +2 pedal pulses   Resp: no accessory muscle use, non-labored,   Skin: no rashes, no areas of induration  Neuro: normal tone, normal sensation to touch Psych:  normal insight, alert and oriented MSK:  Left knee: Tenderness to palpation of the medial femoral condyle. Normal range of motion. Normal strength resistance. Pain with McMurray's test. Pain with valgus stress and mild instability. No pain with patellar grind. Neurovascular intact  Limited ultrasound: Left knee:  No effusion within the suprapatellar pouch. Normal appearing quadricep and patellar tendon. Normal-appearing medial joint space and medial meniscus. Significant disruption at the origin of the MCL with increased vascularity in this area.  The insertion appears to be still intact.  Summary: MCL sprain of the left knee  Ultrasound and interpretation by Clare Gandy, MD    ASSESSMENT & PLAN:   Sprain of medial collateral ligament of left knee The MCL appears disrupted.  Mild instability.  Does not appear to affect of the medial meniscus and  does not have any effusion on exam.  The continued walking may have limited her progress. -Counseled on exercise.  She could try bicycling or elliptical instead of walking. -[place]aid. -Counseled on home exercise therapy and supportive care. -Hinged knee brace. -If no improvement can consider physical therapy or nitro patches.

## 2018-10-30 NOTE — Assessment & Plan Note (Addendum)
The MCL appears disrupted.  Mild instability.  Does not appear to affect of the medial meniscus and does not have any effusion on exam.  The continued walking may have limited her progress. -Counseled on exercise.  She could try bicycling or elliptical instead of walking. -[place]aid. -Counseled on home exercise therapy and supportive care. -Hinged knee brace. -If no improvement can consider physical therapy or nitro patches.

## 2018-10-30 NOTE — Patient Instructions (Signed)
Nice to meet you Please try ice  Please try the exercises  Please try using an elliptical or a stationary bike.  I would avoid walking for exercise as of right now.  Congratulations on the weight loss and keep up the good work. Please send me a message in MyChart with any questions or updates.  Please see me back in 4 weeks.   --Dr. Raeford Razor

## 2018-11-01 ENCOUNTER — Telehealth: Payer: Self-pay | Admitting: *Deleted

## 2018-11-01 NOTE — Telephone Encounter (Signed)
Received fax from [facility] requesting Meloxicam refill. Mychart message sent to pt to verify if she is still taking and requesting refill.

## 2018-11-02 NOTE — Telephone Encounter (Signed)
Called the patient this morning and confirmed she does not need refill of this medication/has pills left and not taking because never worked for her back problem.  She is doing well and has no other questions or concerns.

## 2018-11-02 NOTE — Telephone Encounter (Signed)
Spoke with pharmacist at [facility] to cancel Meloxicam request.

## 2018-11-27 ENCOUNTER — Encounter: Payer: Self-pay | Admitting: Family Medicine

## 2018-11-27 ENCOUNTER — Other Ambulatory Visit: Payer: Self-pay

## 2018-11-27 ENCOUNTER — Ambulatory Visit: Payer: BC Managed Care – PPO | Admitting: Family Medicine

## 2018-11-27 ENCOUNTER — Ambulatory Visit: Payer: Self-pay

## 2018-11-27 VITALS — BP 117/81 | HR 86 | Ht 70.0 in | Wt 227.0 lb

## 2018-11-27 DIAGNOSIS — S83412D Sprain of medial collateral ligament of left knee, subsequent encounter: Secondary | ICD-10-CM | POA: Diagnosis not present

## 2018-11-27 MED ORDER — TRIAMCINOLONE ACETONIDE 40 MG/ML IJ SUSP
40.0000 mg | Freq: Once | INTRAMUSCULAR | Status: AC
  Administered 2018-11-27: 40 mg via INTRA_ARTICULAR

## 2018-11-27 NOTE — Assessment & Plan Note (Signed)
There still appears to be changes at the origin of the MCL on ultrasound.  We will try the injection today to see if she does have any degenerative changes contributing to the stiffness. -Injection. -Referral to physical therapy. -If no improvement need to consider an MRI

## 2018-11-27 NOTE — Progress Notes (Signed)
Maria Johnson - 38 y.o. female MRN [id number]  Date of birth: 07-19-1980  SUBJECTIVE:  Including CC & ROS.  Chief Complaint  Patient presents with  . Follow-up    follow up for left knee    Maria Johnson is a 38 y.o. female that is following up for her left knee injury.  Initial injury was on 9/20.  Feels like the pain is still severe in the medial femoral condyle.  Has worn the hinged knee brace with minimal improvement.  Has tried Pennsaid with limited improvement.  Pain seems to be localized to the knee.  Still has pain in flexion and extension.  Pain can be sharp and stabbing.  She does have stiffness in the morning.  Has been exercising and lost weight and one her challenge..    Review of Systems  Constitutional: Negative for fever.  HENT: Negative for congestion.   Respiratory: Negative for cough.   Cardiovascular: Negative for chest pain.  Gastrointestinal: Negative for abdominal pain.  Musculoskeletal: Positive for gait problem.  Skin: Negative for color change.  Neurological: Negative for weakness.  Hematological: Negative for adenopathy.    HISTORY: Past Medical, Surgical, Social, and Family History Reviewed & Updated per EMR.   Pertinent Historical Findings include:  Past Medical History:  Diagnosis Date  . Acne    takes doxocycline daily for acne  . Anxiety   . Bronchitis    history of  . Carpal tunnel syndrome on both sides   . Chicken pox   . Depression   . Diabetes mellitus   . Headache(784.0)    hx of migraines  . Hyperlipidemia   . Sickle cell trait ([place])   . Sleep apnea    has cpap    Past Surgical History:  Procedure Laterality Date  . ANKLE SURGERY  05/2003   torn cartilage  repair; left  . ANTERIOR LUMBAR FUSION  01/06/2011   Procedure: ANTERIOR LUMBAR FUSION 1 LEVEL;  Surgeon: Peggyann Shoals, MD;  Location: [facility];  Service: Neurosurgery;  Laterality: N/A;  Lumbar five-Sacral one  Anterior Lumbar Interbody Fusion with Instrumentation    . BACK SURGERY     Laminectomy Sept 14, 2011,  . DIAGNOSTIC LAPAROSCOPY  01/2002   removed scar tissue  . LAMINECTOMY AND MICRODISCECTOMY LUMBAR SPINE  10/2009   L4, L5, S1  . LUMBAR FUSION  01/06/11   L5-S1  . TONSILLECTOMY  03/2005  . VAGINAL HYSTERECTOMY  08/2007   partial; AUB    No Known Allergies  Family History  Problem Relation Age of Onset  . Miscarriages / [place] Mother   . Hypertension Mother   . Alcohol abuse Father   . Depression Father   . Drug abuse Father   . Hyperlipidemia Father   . Stroke Father   . Depression Daughter   . Diabetes Daughter   . Hypertension Daughter   . Miscarriages / [place] Daughter   . Birth defects Son   . Diabetes Maternal Grandmother   . Heart attack Maternal Grandmother   . Hypertension Maternal Grandmother   . Stroke Maternal Grandmother   . Diabetes Maternal Grandfather   . Hypertension Maternal Grandfather   . Diabetes Paternal Grandmother   . Lupus Paternal Grandmother      Social History   Socioeconomic History  . Marital status: Single    Spouse name: Not on file  . Number of children: 3  . Years of education: Not on file  . Highest education  level: Master's degree (e.g., MA, MS, MEng, MEd, MSW, MBA)  Occupational History  . Not on file  Social Needs  . Financial resource strain: Not on file  . Food insecurity    Worry: Not on file    Inability: Not on file  . Transportation needs    Medical: Not on file    Non-medical: Not on file  Tobacco Use  . Smoking status: Never Smoker  . Smokeless tobacco: Never Used  Substance and Sexual Activity  . Alcohol use: Yes    Comment: occassional  . Drug use: No  . Sexual activity: Yes    Birth control/protection: Surgical  Lifestyle  . Physical activity    Days per week: Not on file    Minutes per session: Not on file  . Stress: Not on file  Relationships  . Social Musician on phone: Not on file    Gets together: Not on file    Attends  religious service: Not on file    Active member of club or organization: Not on file    Attends meetings of clubs or organizations: Not on file    Relationship status: Not on file  . Intimate partner violence    Fear of current or ex partner: Not on file    Emotionally abused: Not on file    Physically abused: Not on file    Forced sexual activity: Not on file  Other Topics Concern  . Not on file  Social History Narrative  . Not on file     PHYSICAL EXAM:  VS: BP 117/81   Pulse 86   Ht 5\' 10"  (1.778 m)   Wt 227 lb (103 kg)   LMP 08/09/2007   BMI 32.57 kg/m  Physical Exam Gen: NAD, alert, cooperative with exam, well-appearing ENT: normal lips, normal nasal mucosa,  Eye: normal EOM, normal conjunctiva and lids CV:  no edema, +2 pedal pulses   Resp: no accessory muscle use, non-labored,  GI: no masses or tenderness, no hernia  Skin: no rashes, no areas of induration  Neuro: normal tone, normal sensation to touch Psych:  normal insight, alert and oriented MSK:  Left knee: No effusion or redness. Tenderness palpation over the medial femoral condyle. Pain exacerbated with valgus stress testing. Normal flexion extension. Normal strength resistance. Negative Murray's test. Neurovascular intact   Aspiration/Injection Procedure Note Maria Johnson 1980/04/15  Procedure: Injection Indications: Left knee pain  Procedure Details Consent: Risks of procedure as well as the alternatives and risks of each were explained to the (patient/caregiver).  Consent for procedure obtained. Time Out: Verified patient identification, verified procedure, site/side was marked, verified correct patient position, special equipment/implants available, medications/allergies/relevent history reviewed, required imaging and test results available.  Performed.  The area was cleaned with iodine and alcohol swabs.    The left knee superior lateral suprapatellar pouch was injected using 1 cc's of 40 mg  Kenalog and 4 cc's of 0.25% bupivacaine with a 22 1 1/2" needle.  Ultrasound was used. Images were obtained in long views showing the injection.     A sterile dressing was applied.  Patient did tolerate procedure well.       ASSESSMENT & PLAN:   Sprain of medial collateral ligament of left knee There still appears to be changes at the origin of the MCL on ultrasound.  We will try the injection today to see if she does have any degenerative changes contributing to the stiffness. -Injection. -Referral  to physical therapy. -If no improvement need to consider an MRI

## 2018-11-27 NOTE — Patient Instructions (Signed)
Good to see you Please try ice on the knee  Physical therapy should give you a call  Please send me a message in MyChart with any questions or updates.  Please see me back in 6-8 weeks.   --Dr. Raeford Razor

## 2018-11-28 ENCOUNTER — Encounter: Payer: Self-pay | Admitting: Family Medicine

## 2018-12-05 ENCOUNTER — Encounter: Payer: Self-pay | Admitting: Physical Therapy

## 2018-12-05 ENCOUNTER — Other Ambulatory Visit: Payer: Self-pay

## 2018-12-05 ENCOUNTER — Ambulatory Visit: Payer: BC Managed Care – PPO | Admitting: Physical Therapy

## 2018-12-05 DIAGNOSIS — M6281 Muscle weakness (generalized): Secondary | ICD-10-CM

## 2018-12-05 DIAGNOSIS — M25562 Pain in left knee: Secondary | ICD-10-CM | POA: Diagnosis not present

## 2018-12-05 NOTE — Patient Instructions (Signed)
Access Code: [email or web address]  URL: [email or web address]  Date: 12/05/2018  Prepared by: Almyra Free Alleta Avery   Exercises Prone Quadriceps Stretch with Strap - 3 reps - 1 sets - 60 sec hold - 2-3x daily - 7x weekly Squat - 10 reps - 3 sets - 1x daily - 7x weekly Supine Piriformis Stretch - 3 reps - 1 sets - 30-60 sec hold - 2x daily - 7x weekly

## 2018-12-05 NOTE — Therapy (Signed)
[place], [place], [address] Phone: [phone]   Fax:  [phone]  Physical Therapy Treatment  Patient Details  Name: Maria Johnson MRN: [id number] Date of Birth: 1980/09/03 Referring Provider (PT): Clearance Coots   Encounter Date: 12/05/2018  PT End of Session - 12/05/18 1516    Visit Number  1    Number of Visits  12    Date for PT Re-Evaluation  01/16/19    PT Start Time  0962    PT Stop Time  1559    PT Time Calculation (min)  43 min    Activity Tolerance  Patient tolerated treatment well    Behavior During Therapy  [facility] for tasks assessed/performed       Past Medical History:  Diagnosis Date  . Acne    takes doxocycline daily for acne  . Anxiety   . Bronchitis    history of  . Carpal tunnel syndrome on both sides   . Chicken pox   . Depression   . Diabetes mellitus   . Headache(784.0)    hx of migraines  . Hyperlipidemia   . Sickle cell trait ([place])   . Sleep apnea    has cpap    Past Surgical History:  Procedure Laterality Date  . ANKLE SURGERY  05/2003   torn cartilage  repair; left  . ANTERIOR LUMBAR FUSION  01/06/2011   Procedure: ANTERIOR LUMBAR FUSION 1 LEVEL;  Surgeon: Peggyann Shoals, MD;  Location: [facility];  Service: Neurosurgery;  Laterality: N/A;  Lumbar five-Sacral one  Anterior Lumbar Interbody Fusion with Instrumentation   . BACK SURGERY     Laminectomy Sept 14, 2011,  . DIAGNOSTIC LAPAROSCOPY  01/2002   removed scar tissue  . LAMINECTOMY AND MICRODISCECTOMY LUMBAR SPINE  10/2009   L4, L5, S1  . LUMBAR FUSION  01/06/11   L5-S1  . TONSILLECTOMY  03/2005  . VAGINAL HYSTERECTOMY  08/2007   partial; AUB    There were no vitals filed for this visit.  Subjective Assessment - 12/05/18 1521    Subjective  Patient injured her knee the Thurs before Labor Day when a dog pulled her leash and fell on knee. Saw MD the next day. US shows MCL sprain. Patient had  injection 11/30/18 and pain was much better the next day. She's not wearing brace any longer and is not feeling stiffness that she was before. She'll feel it when she walks 3 miles on TM, but overall not having much pain.    Pertinent History  DM, anxiety, depression, migraines, sleep apnea. lumbar fusion, lumbar discectomy/laminectomy    Limitations  Walking    How long can you walk comfortably?  3 miles on TM    Diagnostic tests  xrays,US    Patient Stated Goals  get knee back to 100% and  get better stabilization, get back to normal workouts    Currently in Pain?  Yes    Pain Score  3     Pain Location  Knee    Pain Orientation  Left;Medial    Pain Descriptors / Indicators  Aching;Dull    Pain Type  Acute pain    Pain Onset  More than a month ago    Pain Frequency  Constant    Aggravating Factors   prolonged activity    Pain Relieving Factors  injections         [facility] PT Assessment - 12/05/18 0001  Assessment   Medical Diagnosis  left MCL sprain    Referring Provider (PT)  Clare GandyJeremy Schmitz    Onset Date/Surgical Date  10/04/18    Next MD Visit  01/08/19    Prior Therapy  no      Precautions   Precautions  None      Restrictions   Weight Bearing Restrictions  No      Balance Screen   Has the patient fallen in the past 6 months  Yes    How many times?  1    Has the patient had a decrease in activity level because of a fear of falling?   No    Is the patient reluctant to leave their home because of a fear of falling?   No      Home Environment   Living Environment  Private residence    Additional Comments  stairs to basement; reciprocal gait used      Prior Function   Level of Independence  Independent    Vocation  Full time employment    Vocation Requirements  working at home      Functional Tests   Functional tests  Squat;Lunges;Single leg stance;Single Leg Squat;Step up;Step down      Squat   Comments  shifts right      Lunges   Comments  pain on left        Step Up   Comments  uses momentum on 8" step       Step Down   Comments  poor eccentric control and externally rotates left hip      Single Leg Squat   Comments  shaky on left      Single Leg Stance   Comments  3 sec left 6 sec right      Posture/Postural Control   Posture/Postural Control  Postural limitations    Posture Comments  stands more on RLE; tight right QL      ROM / Strength   AROM / PROM / Strength  AROM;Strength      AROM   Overall AROM Comments  0-125 left knee flex      Strength   Overall Strength Comments  bil hip flex 4+/5;  Bil knee flex/ext 5/5      Flexibility   Soft Tissue Assessment /Muscle Length  yes    Hamstrings  mild tightness bil    Quadriceps  prone knee flex left 106  114 right     Piriformis  left tight    Quadratus Lumborum  tight on right      Palpation   Patella mobility  decreased lateral glide    Palpation comment  pes anserine left                           PT Education - 12/05/18 1558    Education Details  HEP    Person(s) Educated  Patient    Methods  Explanation;Demonstration;Handout    Comprehension  Verbalized understanding;Returned demonstration          PT Long Term Goals - 12/05/18 1617      PT LONG TERM GOAL #1   Title  Ind with HEP for strenghthening and flexibility    Time  6    Period  Weeks    Status  New    Target Date  01/16/19      PT LONG TERM GOAL #2   Title  Patient able  to perform functional tests (see eval) without deviation or weakness in LLE.    Time  6    Period  Weeks    Status  New      PT LONG TERM GOAL #3   Title  Patient able to perform high level SLS activities on the LLE to prevent falls.    Time  6    Period  Weeks    Status  New      PT LONG TERM GOAL #4   Title  Patient able to perform ADLS without left knee pain.    Time  6    Period  Weeks    Status  New      PT LONG TERM GOAL #5   Title  Patient able to return to normal gym workout with pain  2/10 or less.            Plan - 12/05/18 1609    Clinical Impression Statement  Patient presents s/p left MCL sprain on 10/04/18 when she fell on knee after her dog pulled the leash. She had an injection on 11/30/18 which helped significantly. She now has constant low level pain and demo's weakness with functional activities including stairs, squats and lunges. She also has decreased balance on the LLE and stands with decreased WB on the left. She will benefit from skilled PT to address these benefits and return her to her PLOF.    Personal Factors and Comorbidities  Comorbidity 2;Comorbidity 3+    Comorbidities  lumbar fusion, DM, anxiety, depression, migraines    Examination-Activity Limitations  Squat;Stairs    Examination-Participation Restrictions  Other   working out   Stability/Clinical Decision Making  Stable/Uncomplicated    Clinical Decision Making  Low    Rehab Potential  Excellent    PT Frequency  2x / week    PT Duration  6 weeks    PT Treatment/Interventions  ADLs/Self Care Home Management;Cryotherapy;Electrical Stimulation;Iontophoresis 4mg /ml Dexamethasone;Moist Heat;Ultrasound;Therapeutic activities;Therapeutic exercise;Balance training;Neuromuscular re-education;Patient/family education;Dry needling;Manual techniques;Taping    PT Next Visit Plan  fuctional LE strengthening, balance, flexibility; if pain develops possibly ionto    PT Home Exercise Plan  [id number]    Consulted and Agree with Plan of Care  Patient       Patient will benefit from skilled therapeutic intervention in order to improve the following deficits and impairments:  Abnormal gait, Pain, Impaired flexibility, Decreased strength, Postural dysfunction  Visit Diagnosis: Acute pain of left knee - Plan: PT plan of care cert/re-cert  Muscle weakness (generalized) - Plan: PT plan of care cert/re-cert     Problem List Patient Active Problem List   Diagnosis Date Noted  . Sprain of medial collateral  ligament of left knee 10/30/2018  . Bilateral carpal tunnel syndrome 04/04/2018  . Atopic dermatitis 04/04/2018  . Diabetes mellitus type 2 in obese ([facility]) 12/27/2017  . OSA on CPAP 09/25/2017  . Well adult exam 09/25/2017  . Vitamin D deficiency 08/24/2017  . Insomnia 08/24/2017    08/26/2017 PT 12/05/2018, 4:28 PM  [facility] [address] [place], [place], [place] Phone: [phone]   Fax:  [phone]  Name: Maria Johnson MRN: Hilbert [id number] Date of Birth: 11-Aug-1980

## 2018-12-06 ENCOUNTER — Encounter: Payer: Self-pay | Admitting: Family Medicine

## 2018-12-10 ENCOUNTER — Other Ambulatory Visit: Payer: Self-pay

## 2018-12-10 ENCOUNTER — Ambulatory Visit (INDEPENDENT_AMBULATORY_CARE_PROVIDER_SITE_OTHER): Payer: BC Managed Care – PPO | Admitting: Physical Therapy

## 2018-12-10 ENCOUNTER — Encounter: Payer: Self-pay | Admitting: Physical Therapy

## 2018-12-10 DIAGNOSIS — M6281 Muscle weakness (generalized): Secondary | ICD-10-CM

## 2018-12-10 DIAGNOSIS — M25562 Pain in left knee: Secondary | ICD-10-CM

## 2018-12-10 NOTE — Therapy (Signed)
[place], [place], [address] Phone: [phone]   Fax:  [phone]  Physical Therapy Treatment  Patient Details  Name: Maria Johnson MRN: [id number] Date of Birth: 09-09-1980 Referring Provider (PT): Clearance Coots   Encounter Date: 12/10/2018  PT End of Session - 12/10/18 0759    Visit Number  2    Number of Visits  12    Date for PT Re-Evaluation  01/16/19    PT Start Time  0800    PT Stop Time  0843    PT Time Calculation (min)  43 min    Activity Tolerance  Patient tolerated treatment well    Behavior During Therapy  [facility] for tasks assessed/performed       Past Medical History:  Diagnosis Date  . Acne    takes doxocycline daily for acne  . Anxiety   . Bronchitis    history of  . Carpal tunnel syndrome on both sides   . Chicken pox   . Depression   . Diabetes mellitus   . Headache(784.0)    hx of migraines  . Hyperlipidemia   . Sickle cell trait ([place])   . Sleep apnea    has cpap    Past Surgical History:  Procedure Laterality Date  . ANKLE SURGERY  05/2003   torn cartilage  repair; left  . ANTERIOR LUMBAR FUSION  01/06/2011   Procedure: ANTERIOR LUMBAR FUSION 1 LEVEL;  Surgeon: Peggyann Shoals, MD;  Location: [place] [facility];  Service: Neurosurgery;  Laterality: N/A;  Lumbar five-Sacral one  Anterior Lumbar Interbody Fusion with Instrumentation   . BACK SURGERY     Laminectomy Sept 14, 2011,  . DIAGNOSTIC LAPAROSCOPY  01/2002   removed scar tissue  . LAMINECTOMY AND MICRODISCECTOMY LUMBAR SPINE  10/2009   L4, L5, S1  . LUMBAR FUSION  01/06/11   L5-S1  . TONSILLECTOMY  03/2005  . VAGINAL HYSTERECTOMY  08/2007   partial; AUB    There were no vitals filed for this visit.  Subjective Assessment - 12/10/18 0801    Subjective  "My knee has been stiff this morning. I walked on the treadmill this morning and did the squats on my HEP over the weekend. I stretch every morning before i  work out. The injection made me feel 85% better."    Currently in Pain?  Yes    Pain Score  4     Pain Location  Knee    Pain Orientation  Left    Pain Descriptors / Indicators  Other (Comment)   Stiff   Pain Type  Acute pain    Aggravating Factors   Sitting for a long period of time    Pain Relieving Factors  Movement         [facility] PT Assessment - 12/10/18 0001      Assessment   Medical Diagnosis  left MCL sprain    Referring Provider (PT)  Clearance Coots    Onset Date/Surgical Date  10/04/18    Next MD Visit  01/08/19    Prior Therapy  no      Single Leg Stance   Comments  4 sec LLE, 15 sec RLE      Flexibility   Quadriceps  130 degrees in prone with strap       [facility] Adult PT Treatment/Exercise - 12/10/18 0001      Self-Care   Self-Care  Other Self-Care Comments  Other Self-Care Comments   Pt educated on self-massage; pt verbalized understanding and returned demo     Exercises   Exercises  Knee/Hip      Knee/Hip Exercises: Stretches   Passive adductor Stretch  Left;2 reps;20 seconds   supine with strap/abducted   Quad Stretch  Left;4 reps;20 seconds   1 rep in standing; 3 reps prone with strap;    Gastroc Stretch  Right;Left;2 reps;20 seconds   on edge of 3" step     Knee/Hip Exercises: Aerobic   Stationary Bike  L1:31mn      Knee/Hip Exercises: Standing   Forward Step Up  Right;Left;1 set;10 reps    SLS  Rt/Lt 2 reps on each side   Rt 15 sec; Lt 4sec intermittent B UE support     Knee/Hip Exercises: Seated   Sit to Sand  10 reps;without UE support   VC's for equal weight bearing in B LE     Knee/Hip Exercises: Supine   Straight Leg Raises  Strengthening;10 reps;Left   And SLR with ER x 10 with LLE           Manual Therapy   Manual Therapy  Taping;Joint mobilization    Manual therapy comments  2 I strips of regular rock tape in x pattern to Lt medial knee with 20% stretch to decompress tissue, and increase proprioception.              PT  Long Term Goals - 12/05/18 1617      PT LONG TERM GOAL #1   Title  Ind with HEP for strenghthening and flexibility    Time  6    Period  Weeks    Status  New    Target Date  01/16/19      PT LONG TERM GOAL #2   Title  Patient able to perform functional tests (see eval) without deviation or weakness in LLE.    Time  6    Period  Weeks    Status  New      PT LONG TERM GOAL #3   Title  Patient able to perform high level SLS activities on the LLE to prevent falls.    Time  6    Period  Weeks    Status  New      PT LONG TERM GOAL #4   Title  Patient able to perform ADLS without left knee pain.    Time  6    Period  Weeks    Status  New      PT LONG TERM GOAL #5   Title  Patient able to return to normal gym workout with pain 2/10 or less.       Education :  HEP updated.  Issued handout.      Plan - 12/10/18 0854    Clinical Impression Statement  Pt continues with decreased balance with Lt SLS and decreased WB on LLE.  Pt fatigues in LLE with SLR.  Tolerated all exercises without increase in Lt knee pain.  Pt reported reduction of pain by 1 point with application of kinesiotape to medial knee.  No goals met yet; only 2nd visit.    Personal Factors and Comorbidities  Comorbidity 2;Comorbidity 3+    Comorbidities  lumbar fusion, DM, anxiety, depression, migraines    Examination-Activity Limitations  Squat;Stairs    Examination-Participation Restrictions  Other   working out   Stability/Clinical Decision Making  Stable/Uncomplicated    Rehab Potential  Excellent  PT Frequency  2x / week    PT Duration  6 weeks    PT Treatment/Interventions  ADLs/Self Care Home Management;Cryotherapy;Electrical Stimulation;Iontophoresis 46m/ml Dexamethasone;Moist Heat;Ultrasound;Therapeutic activities;Therapeutic exercise;Balance training;Neuromuscular re-education;Patient/family education;Dry needling;Manual techniques;Taping    PT Next Visit Plan  fuctional LE strengthening, balance,  flexibility; assess ktape, reapply or possibly ionto.    PT Home Exercise Plan  [email or web address]    Consulted and Agree with Plan of Care  Patient       Patient will benefit from skilled therapeutic intervention in order to improve the following deficits and impairments:  Abnormal gait, Pain, Impaired flexibility, Decreased strength, Postural dysfunction  Visit Diagnosis: Acute pain of left knee  Muscle weakness (generalized)     Problem List Patient Active Problem List   Diagnosis Date Noted  . Sprain of medial collateral ligament of left knee 10/30/2018  . Bilateral carpal tunnel syndrome 04/04/2018  . Atopic dermatitis 04/04/2018  . Diabetes [place] 2 in obese ([place] 12/27/2017  . OSA on CPAP 09/25/2017  . Well adult exam 09/25/2017  . Vitamin D deficiency 08/24/2017  . Insomnia 08/24/2017   JKerin Perna PTA 12/10/18 9:30 AM  [facility] [address]Phone: [phone]  Fax:  [phone] Name: CCATTALEYA WIENMRN: [id number] of Birth: 4May 28, 1982

## 2018-12-11 DIAGNOSIS — M4184 Other forms of scoliosis, thoracic region: Secondary | ICD-10-CM | POA: Diagnosis not present

## 2018-12-11 DIAGNOSIS — S299XXA Unspecified injury of thorax, initial encounter: Secondary | ICD-10-CM | POA: Diagnosis not present

## 2018-12-11 DIAGNOSIS — M542 Cervicalgia: Secondary | ICD-10-CM | POA: Diagnosis not present

## 2018-12-11 DIAGNOSIS — S3992XA Unspecified injury of lower back, initial encounter: Secondary | ICD-10-CM | POA: Diagnosis not present

## 2018-12-11 DIAGNOSIS — M5136 Other intervertebral disc degeneration, lumbar region: Secondary | ICD-10-CM | POA: Diagnosis not present

## 2018-12-11 DIAGNOSIS — M25511 Pain in right shoulder: Secondary | ICD-10-CM | POA: Diagnosis not present

## 2018-12-11 DIAGNOSIS — M549 Dorsalgia, unspecified: Secondary | ICD-10-CM | POA: Diagnosis not present

## 2018-12-11 DIAGNOSIS — M545 Low back pain: Secondary | ICD-10-CM | POA: Diagnosis not present

## 2018-12-11 DIAGNOSIS — S4991XA Unspecified injury of right shoulder and upper arm, initial encounter: Secondary | ICD-10-CM | POA: Diagnosis not present

## 2018-12-12 ENCOUNTER — Encounter: Payer: Self-pay | Admitting: Physical Therapy

## 2018-12-12 ENCOUNTER — Ambulatory Visit: Payer: BC Managed Care – PPO | Admitting: Physical Therapy

## 2018-12-12 ENCOUNTER — Other Ambulatory Visit: Payer: Self-pay

## 2018-12-12 DIAGNOSIS — M25562 Pain in left knee: Secondary | ICD-10-CM | POA: Diagnosis not present

## 2018-12-12 DIAGNOSIS — M6281 Muscle weakness (generalized): Secondary | ICD-10-CM | POA: Diagnosis not present

## 2018-12-12 NOTE — Therapy (Signed)
[facility] [address] [place], [place], [address] Phone: [phone]   Fax:  [phone]  Physical Therapy Treatment  Patient Details  Name: Maria Johnson MRN: [id number] Date of Birth: 07/02/1980 Referring Provider (PT): Clare GandyJeremy Schmitz   Encounter Date: 12/12/2018  PT End of Session - 12/12/18 0759    Visit Number  3    Number of Visits  12    Date for PT Re-Evaluation  01/16/19    PT Start Time  0759    PT Stop Time  0845    PT Time Calculation (min)  46 min    Activity Tolerance  Patient tolerated treatment well    Behavior During Therapy  [facility] for tasks assessed/performed       Past Medical History:  Diagnosis Date  . Acne    takes doxocycline daily for acne  . Anxiety   . Bronchitis    history of  . Carpal tunnel syndrome on both sides   . Chicken pox   . Depression   . Diabetes mellitus   . Headache(784.0)    hx of migraines  . Hyperlipidemia   . Sickle cell trait (HCC)   . Sleep apnea    has cpap    Past Surgical History:  Procedure Laterality Date  . ANKLE SURGERY  05/2003   torn cartilage  repair; left  . ANTERIOR LUMBAR FUSION  01/06/2011   Procedure: ANTERIOR LUMBAR FUSION 1 LEVEL;  Surgeon: Dorian HeckleJoseph D Stern, MD;  Location: [facility];  Service: Neurosurgery;  Laterality: N/A;  Lumbar five-Sacral one  Anterior Lumbar Interbody Fusion with Instrumentation   . BACK SURGERY     Laminectomy Sept 14, 2011,  . DIAGNOSTIC LAPAROSCOPY  01/2002   removed scar tissue  . LAMINECTOMY AND MICRODISCECTOMY LUMBAR SPINE  10/2009   L4, L5, S1  . LUMBAR FUSION  01/06/11   L5-S1  . TONSILLECTOMY  03/2005  . VAGINAL HYSTERECTOMY  08/2007   partial; AUB    There were no vitals filed for this visit.  Subjective Assessment - 12/12/18 0800    Subjective  Pt stated that the taping gave her some relief. Pt reports she has already been to the gym this morning at 3:30am.  She is doing cardio 2x/day; trying to  lose weight.    Currently in Pain?  Yes    Pain Score  4     Pain Location  Knee    Pain Orientation  Left    Pain Descriptors / Indicators  Aching    Pain Type  Acute pain         [facility] PT Assessment - 12/12/18 0001      Assessment   Medical Diagnosis  left MCL sprain    Referring Provider (PT)  Clare GandyJeremy Schmitz    Onset Date/Surgical Date  10/04/18    Next MD Visit  01/08/19    Prior Therapy  no                   [facility] Adult PT Treatment/Exercise - 12/12/18 0001      Self-Care   Self-Care  Other Self-Care Comments    Other Self-Care Comments   Pt educated on how to apply ktape to Lt medial knee; pt verbalized understanding. Pt encouraged to be mindful of how much she is doing at gym to not irritate her knee.      Knee/Hip Exercises: Stretches   Physiological scientistQuad Stretch  Left;2 reps;30 seconds  standing   ITB Stretch  Left;2 reps;20 seconds   supine   Gastroc Stretch  Right;Left;2 reps;20 seconds   on edge of 3" step   Gastroc Stretch Limitations  1 rep of runners stretch x 20 sec, switched to heels off of step      Knee/Hip Exercises: Aerobic   Stationary Bike  L1: 5 min      Knee/Hip Exercises: Standing   Lateral Step Up  Left;1 set;10 reps;Hand Hold: 1;Step Height: 8"    Lateral Step Up Limitations  cues to slow pace    Forward Step Up  Left;2 sets;10 reps;Hand Hold: 1;Step Height: 6";Step Height: 8"    Forward Step Up Limitations  cues to slow pace    SLS  Rt forward leans to touch chair arms, seat x 10 (challenging, decreased balance, performed next to counter)       Knee/Hip Exercises: Supine   Straight Leg Raise with External Rotation  Strengthening;Left;1 set;10 reps   multiple cues for technique and TKE     Modalities   Modalities  Cryotherapy      Cryotherapy   Number Minutes Cryotherapy  10 Minutes    Cryotherapy Location  Knee    Type of Cryotherapy  Ice pack      Manual Therapy   Manual Therapy  Taping    Manual therapy comments  2- I strips of  regular rock tape in x pattern to Lt medial knee to decompress tissue and increase proprioception             PT Education - 12/12/18 0901    Education Details  Taping    Person(s) Educated  Patient    Methods  Explanation;Demonstration;Verbal cues    Comprehension  Verbalized understanding          PT Long Term Goals - 12/05/18 1617      PT LONG TERM GOAL #1   Title  Ind with HEP for strenghthening and flexibility    Time  6    Period  Weeks    Status  New    Target Date  01/16/19      PT LONG TERM GOAL #2   Title  Patient able to perform functional tests (see eval) without deviation or weakness in LLE.    Time  6    Period  Weeks    Status  New      PT LONG TERM GOAL #3   Title  Patient able to perform high level SLS activities on the LLE to prevent falls.    Time  6    Period  Weeks    Status  New      PT LONG TERM GOAL #4   Title  Patient able to perform ADLS without left knee pain.    Time  6    Period  Weeks    Status  New      PT LONG TERM GOAL #5   Title  Patient able to return to normal gym workout with pain 2/10 or less.            Plan - 12/12/18 0840    Clinical Impression Statement  Pt had positive response to taping on her left knee. Pt continues to have difficulty with dynamic standing balance on Lt LE. Pt reported relief after ice to Lt knee. Discussed modifying exercise regimen with pt to reduce strain on L knee. She is making gradual progress toward all goals.    Personal Factors  and Comorbidities  Comorbidity 2;Comorbidity 3+    Comorbidities  lumbar fusion, DM, anxiety, depression, migraines    Examination-Activity Limitations  Squat;Stairs    Examination-Participation Restrictions  Other   working out   Stability/Clinical Decision Making  Stable/Uncomplicated    Rehab Potential  Excellent    PT Frequency  2x / week    PT Duration  6 weeks    PT Treatment/Interventions  ADLs/Self Care Home Management;Cryotherapy;Electrical  Stimulation;Iontophoresis 4mg /ml Dexamethasone;Moist Heat;Ultrasound;Therapeutic activities;Therapeutic exercise;Balance training;Neuromuscular re-education;Patient/family education;Dry needling;Manual techniques;Taping    PT Next Visit Plan  fuctional LE strengthening, balance, flexibility; assess ktape, reapply or possibly ionto.    PT Home Exercise Plan  [id number]    Consulted and Agree with Plan of Care  Patient       Patient will benefit from skilled therapeutic intervention in order to improve the following deficits and impairments:  Abnormal gait, Pain, Impaired flexibility, Decreased strength, Postural dysfunction  Visit Diagnosis: Acute pain of left knee  Muscle weakness (generalized)     Problem List Patient Active Problem List   Diagnosis Date Noted  . Sprain of medial collateral ligament of left knee 10/30/2018  . Bilateral carpal tunnel syndrome 04/04/2018  . Atopic dermatitis 04/04/2018  . Diabetes mellitus type 2 in obese ([place]) 12/27/2017  . OSA on CPAP 09/25/2017  . Well adult exam 09/25/2017  . Vitamin D deficiency 08/24/2017  . Insomnia 08/24/2017    Maria Johnson, SPTA 12/12/2018, 9:20 AM   Kerin Perna, PTA 12/12/18 10:36 AM   [facility] [place] [address] [place], [place], [address] Phone: [phone]   Fax:  [phone]  Name: Maria Johnson MRN: [id number] Date of Birth: Apr 10, 1980

## 2018-12-17 ENCOUNTER — Other Ambulatory Visit: Payer: Self-pay

## 2018-12-17 ENCOUNTER — Ambulatory Visit: Payer: BC Managed Care – PPO | Admitting: Physical Therapy

## 2018-12-17 DIAGNOSIS — M25562 Pain in left knee: Secondary | ICD-10-CM | POA: Diagnosis not present

## 2018-12-17 DIAGNOSIS — M6281 Muscle weakness (generalized): Secondary | ICD-10-CM

## 2018-12-17 NOTE — Therapy (Signed)
[facility] [place] [address] [place], [place], [address] Phone: [phone]   Fax:  [phone]  Physical Therapy Treatment  Patient Details  Name: Maria Johnson MRN: [id number] Date of Birth: March 05, 1980 Referring Provider (PT): Clare Gandy   Encounter Date: 12/17/2018  PT End of Session - 12/17/18 0754    Visit Number  4    Number of Visits  12    Date for PT Re-Evaluation  01/16/19    PT Start Time  0800    PT Stop Time  0841    PT Time Calculation (min)  41 min    Activity Tolerance  Patient tolerated treatment well    Behavior During Therapy  [facility] for tasks assessed/performed       Past Medical History:  Diagnosis Date  . Acne    takes doxocycline daily for acne  . Anxiety   . Bronchitis    history of  . Carpal tunnel syndrome on both sides   . Chicken pox   . Depression   . Diabetes mellitus   . Headache(784.0)    hx of migraines  . Hyperlipidemia   . Sickle cell trait (HCC)   . Sleep apnea    has cpap    Past Surgical History:  Procedure Laterality Date  . ANKLE SURGERY  05/2003   torn cartilage  repair; left  . ANTERIOR LUMBAR FUSION  01/06/2011   Procedure: ANTERIOR LUMBAR FUSION 1 LEVEL;  Surgeon: Dorian Heckle, MD;  Location: [facility];  Service: Neurosurgery;  Laterality: N/A;  Lumbar five-Sacral one  Anterior Lumbar Interbody Fusion with Instrumentation   . BACK SURGERY     Laminectomy Sept 14, 2011,  . DIAGNOSTIC LAPAROSCOPY  01/2002   removed scar tissue  . LAMINECTOMY AND MICRODISCECTOMY LUMBAR SPINE  10/2009   L4, L5, S1  . LUMBAR FUSION  01/06/11   L5-S1  . TONSILLECTOMY  03/2005  . VAGINAL HYSTERECTOMY  08/2007   partial; AUB    There were no vitals filed for this visit.  Subjective Assessment - 12/17/18 0801    Subjective  Patient states she awoke this morning and her knee was hurting more. She can't recall doing anything except that she is doing balance activities without  her shoe on and that it's easier that way.    Pertinent History  DM, anxiety, depression, migraines, sleep apnea. lumbar fusion, lumbar discectomy/laminectomy    Patient Stated Goals  get knee back to 100% and  get better stabilization, get back to normal workouts    Currently in Pain?  Yes    Pain Score  8     Pain Location  Knee    Pain Orientation  Left    Pain Descriptors / Indicators  Aching    Pain Type  Acute pain                       [facility] Adult PT Treatment/Exercise - 12/17/18 0001      Knee/Hip Exercises: Stretches   Quad Stretch  Both;2 reps;30 seconds    Gastroc Stretch  2 reps;30 seconds;Both    Gastroc Stretch Limitations  runners stretch    Other Knee/Hip Stretches  Adductor stretch with left foot on top of stairs 2x30 sec      Knee/Hip Exercises: Aerobic   Stationary Bike  L1: 5 min      Knee/Hip Exercises: Standing   Lateral Step Up  Left;2 sets;10 reps;Hand Hold:  1    Lateral Step Up Limitations  with rigth hip ABD     Forward Step Up  Left;3 sets;10 reps;Hand Hold: 1    Forward Step Up Limitations  last 2 steps with right knee raise    Step Down  Left;2 sets;10 reps;Hand Hold: 2;Step Height: 4"    Step Down Limitations  heel tap fwd    SLS  forward lean still requiring one UE support    SLS with Vectors  on  blue oval x 5 reps    Rebounder  no shoe on blue oval x 30             PT Education - 12/17/18 0840    Education Details  ionto education    Person(s) Educated  Patient    Methods  Explanation;Handout    Comprehension  Verbalized understanding          PT Long Term Goals - 12/05/18 1617      PT LONG TERM GOAL #1   Title  Ind with HEP for strenghthening and flexibility    Time  6    Period  Weeks    Status  New    Target Date  01/16/19      PT LONG TERM GOAL #2   Title  Patient able to perform functional tests (see eval) without deviation or weakness in LLE.    Time  6    Period  Weeks    Status  New      PT  LONG TERM GOAL #3   Title  Patient able to perform high level SLS activities on the LLE to prevent falls.    Time  6    Period  Weeks    Status  New      PT LONG TERM GOAL #4   Title  Patient able to perform ADLS without left knee pain.    Time  6    Period  Weeks    Status  New      PT LONG TERM GOAL #5   Title  Patient able to return to normal gym workout with pain 2/10 or less.            Plan - 12/17/18 0843    Clinical Impression Statement  Patient presents with increased pain today. She did well with SLS activities without shoe but still unsteady. She reports having orthotics but not using them in all her shoes. PT advised wearing everyday and with workouts. Ionto trial applied to left medial knee.    Comorbidities  lumbar fusion, DM, anxiety, depression, migraines    PT Frequency  2x / week    PT Duration  6 weeks    PT Treatment/Interventions  ADLs/Self Care Home Management;Cryotherapy;Electrical Stimulation;Iontophoresis 4mg /ml Dexamethasone;Moist Heat;Ultrasound;Therapeutic activities;Therapeutic exercise;Balance training;Neuromuscular re-education;Patient/family education;Dry needling;Manual techniques;Taping    PT Next Visit Plan  fuctional LE strengthening, balance, flexibility; assess ionto.    PT Home Exercise Plan  [id number]       Patient will benefit from skilled therapeutic intervention in order to improve the following deficits and impairments:  Abnormal gait, Pain, Impaired flexibility, Decreased strength, Postural dysfunction  Visit Diagnosis: Acute pain of left knee  Muscle weakness (generalized)     Problem List Patient Active Problem List   Diagnosis Date Noted  . Sprain of medial collateral ligament of left knee 10/30/2018  . Bilateral carpal tunnel syndrome 04/04/2018  . Atopic dermatitis 04/04/2018  . Diabetes mellitus type 2 in obese ([place]) 12/27/2017  .  OSA on CPAP 09/25/2017  . Well adult exam 09/25/2017  . Vitamin D deficiency  08/24/2017  . Insomnia 08/24/2017    Solon PalmJulie Feliciana Narayan PT 12/17/2018, 5:02 PM  [facility] [address] [place], [place], [address] Phone: [phone]   Fax:  [phone]  Name: Maria Johnson MRN: [id number] Date of Birth: 05/06/1980

## 2018-12-17 NOTE — Patient Instructions (Signed)

## 2018-12-20 ENCOUNTER — Encounter: Payer: Self-pay | Admitting: Family Medicine

## 2018-12-21 ENCOUNTER — Encounter: Payer: Self-pay | Admitting: Physical Therapy

## 2018-12-21 ENCOUNTER — Ambulatory Visit (INDEPENDENT_AMBULATORY_CARE_PROVIDER_SITE_OTHER): Payer: BC Managed Care – PPO | Admitting: Physical Therapy

## 2018-12-21 ENCOUNTER — Other Ambulatory Visit: Payer: Self-pay

## 2018-12-21 DIAGNOSIS — M6281 Muscle weakness (generalized): Secondary | ICD-10-CM | POA: Diagnosis not present

## 2018-12-21 DIAGNOSIS — M25562 Pain in left knee: Secondary | ICD-10-CM | POA: Diagnosis not present

## 2018-12-21 NOTE — Therapy (Addendum)
[facility] [place], [place], [address] Phone: [phone]   Fax:  [phone]  Physical Therapy Treatment  Patient Details  Name: Maria Johnson MRN: [id number] Date of Birth: 09-02-1980 Referring Provider (PT): Clearance Coots   Encounter Date: 12/21/2018  PT End of Session - 12/21/18 0837    Visit Number  5    Number of Visits  12    Date for PT Re-Evaluation  01/16/19    PT Start Time  0842    PT Stop Time  0923    PT Time Calculation (min)  41 min    Activity Tolerance  Patient tolerated treatment well    Behavior During Therapy  [facility] for tasks assessed/performed       Past Medical History:  Diagnosis Date  . Acne    takes doxocycline daily for acne  . Anxiety   . Bronchitis    history of  . Carpal tunnel syndrome on both sides   . Chicken pox   . Depression   . Diabetes mellitus   . Headache(784.0)    hx of migraines  . Hyperlipidemia   . Sickle cell trait ([place])   . Sleep apnea    has cpap    Past Surgical History:  Procedure Laterality Date  . ANKLE SURGERY  05/2003   torn cartilage  repair; left  . ANTERIOR LUMBAR FUSION  01/06/2011   Procedure: ANTERIOR LUMBAR FUSION 1 LEVEL;  Surgeon: Peggyann Shoals, MD;  Location: [facility];  Service: Neurosurgery;  Laterality: N/A;  Lumbar five-Sacral one  Anterior Lumbar Interbody Fusion with Instrumentation   . BACK SURGERY     Laminectomy Sept 14, 2011,  . DIAGNOSTIC LAPAROSCOPY  01/2002   removed scar tissue  . LAMINECTOMY AND MICRODISCECTOMY LUMBAR SPINE  10/2009   L4, L5, S1  . LUMBAR FUSION  01/06/11   L5-S1  . TONSILLECTOMY  03/2005  . VAGINAL HYSTERECTOMY  08/2007   partial; AUB    There were no vitals filed for this visit.  Subjective Assessment - 12/21/18 0843    Subjective  Pt reports good response to ionto patch. She states she hasn't been to the gym in three days; reports overall decreased knee pain.    Currently in Pain?   Yes    Pain Score  5     Pain Location  Knee    Pain Orientation  Left    Pain Descriptors / Indicators  Aching    Pain Type  Acute pain    Aggravating Factors   sitting for a long period of time    Pain Relieving Factors  movement, heat, elevation, rest         [facility] PT Assessment - 12/21/18 0001      Assessment   Medical Diagnosis  left MCL sprain    Referring Provider (PT)  Clearance Coots    Onset Date/Surgical Date  10/04/18    Next MD Visit  01/08/19    Prior Therapy  no      Strength   Strength Assessment Site  Hip    Right/Left Hip  Left    Left Hip Flexion  5/5       [facility] Adult PT Treatment/Exercise - 12/21/18 0001      Knee/Hip Exercises: Stretches   Quad Stretch  Left;4 reps;20 seconds   prone with strap   Gastroc Stretch  Both;2 reps;20 seconds   on incline     Knee/Hip  Exercises: Aerobic   Stationary Bike  --    Recumbent Bike  L1 x 4 min      Knee/Hip Exercises: Standing   Lateral Step Up  Left;2 sets;10 reps;Step Height: 6";Hand Hold: 1   with hip abduction on top step   Forward Step Up  Left;15 reps;Hand Hold: 2, 6" step   heel taps   Step Down  Left;10 reps;Hand Hold: 2 , 6" step   heel taps    Functional Squat  20 reps to low black mat 10 reps regular stance;10reps staggered stance   SLS  Lt SLS with foward lean x 10 reps cues to not touch down with Rt in between reps; Lt SLS  held up to 18 sec without B UE support    SLS   on blue oval x 3 reps up to 15 sec with intermittent B UE support    Rebounder  forward facing x 30 reps; 2 o' clock and 10 o' clock position x 15 reps      Modalities   Modalities  Iontophoresis      Iontophoresis   Type of Iontophoresis  Dexamethasone    Location  Lt medial knee    Dose  1.0 cc    Time  12 hr, 120 mA                 PT Long Term Goals - 12/05/18 1617      PT LONG TERM GOAL #1   Title  Ind with HEP for strenghthening and flexibility    Time  6    Period  Weeks    Status  New     Target Date  01/16/19      PT LONG TERM GOAL #2   Title  Patient able to perform functional tests (see eval) without deviation or weakness in LLE.    Time  6    Period  Weeks    Status  New      PT LONG TERM GOAL #3   Title  Patient able to perform high level SLS activities on the LLE to prevent falls.    Time  6    Period  Weeks    Status  New      PT LONG TERM GOAL #4   Title  Patient able to perform ADLS without left knee pain.    Time  6    Period  Weeks    Status  New      PT LONG TERM GOAL #5   Title  Patient able to return to normal gym workout with pain 2/10 or less.            Plan - 12/21/18 0946    Clinical Impression Statement  Pt reports positive response to ionto patch for pain relief. Pt tolerated SLS activities well today with orthotics in shoes; appeared to have more stability. Pt progressing towards LTG #2 with incease in Lt LE extremity strength. Will continue to benefit from skilled PT intervention for improved functional mobility.    Comorbidities  lumbar fusion, DM, anxiety, depression, migraines    Rehab Potential  Excellent    PT Frequency  2x / week    PT Duration  6 weeks    PT Treatment/Interventions  ADLs/Self Care Home Management;Cryotherapy;Electrical Stimulation;Iontophoresis 41m/ml Dexamethasone;Moist Heat;Ultrasound;Therapeutic activities;Therapeutic exercise;Balance training;Neuromuscular re-education;Patient/family education;Dry needling;Manual techniques;Taping    PT Next Visit Plan  functional LE strengthening, balance, flexibility; assess ionto.    PT Home Exercise Plan  [id number]       Patient will benefit from skilled therapeutic intervention in order to improve the following deficits and impairments:  Abnormal gait, Pain, Impaired flexibility, Decreased strength, Postural dysfunction  Visit Diagnosis: Acute pain of left knee  Muscle weakness (generalized)     Problem List Patient Active Problem List   Diagnosis Date Noted   . Sprain of medial collateral ligament of left knee 10/30/2018  . Bilateral carpal tunnel syndrome 04/04/2018  . Atopic dermatitis 04/04/2018  . Diabetes mellitus type 2 in obese ([place]) 12/27/2017  . OSA on CPAP 09/25/2017  . Well adult exam 09/25/2017  . Vitamin D deficiency 08/24/2017  . Insomnia 08/24/2017    Gardiner Rhyme, SPTA 12/21/2018, 11:22 AM   Kerin Perna, PTA 12/21/18 12:46 PM   [facility] [place], [place], [address] Phone: [phone]   Fax:  [phone]  Name: Maria Johnson MRN: [id number] Date of Birth: 03-19-1980  PHYSICAL THERAPY DISCHARGE SUMMARY  Visits from Start of Care: 5  Current functional level related to goals / functional outcomes: See above    Remaining deficits: See above    Education / Equipment: HEP  Plan: Patient agrees to discharge.  Patient goals were not met. Patient is being discharged due to not returning since the last visit.  ?????     Madelyn Flavors, PT 02/05/19 5:05 PM  [facility] [place], [place] [address]  [phone] (office) [phone] (fax)

## 2018-12-24 ENCOUNTER — Encounter: Payer: BC Managed Care – PPO | Admitting: Physical Therapy

## 2018-12-31 ENCOUNTER — Encounter: Payer: BC Managed Care – PPO | Admitting: Physical Therapy

## 2019-01-02 ENCOUNTER — Encounter: Payer: BC Managed Care – PPO | Admitting: Physical Therapy

## 2019-01-04 ENCOUNTER — Telehealth: Payer: Self-pay | Admitting: *Deleted

## 2019-01-04 NOTE — Telephone Encounter (Signed)
Last written: 04/12/18 Last ov: 10/05/18 Next ov: 01/18/19  Contract: not done UDS: not done

## 2019-01-05 MED ORDER — ALPRAZOLAM 0.5 MG PO TABS: ORAL_TABLET | ORAL | 1 refills | Status: DC

## 2019-01-05 NOTE — Telephone Encounter (Signed)
Sent!

## 2019-01-08 ENCOUNTER — Ambulatory Visit: Payer: BC Managed Care – PPO | Admitting: Family Medicine

## 2019-01-08 ENCOUNTER — Encounter: Payer: BC Managed Care – PPO | Admitting: Family Medicine

## 2019-01-11 ENCOUNTER — Encounter: Payer: Self-pay | Admitting: Family Medicine

## 2019-01-11 ENCOUNTER — Ambulatory Visit: Payer: BC Managed Care – PPO | Admitting: Family Medicine

## 2019-01-11 ENCOUNTER — Other Ambulatory Visit: Payer: Self-pay

## 2019-01-11 DIAGNOSIS — S83412D Sprain of medial collateral ligament of left knee, subsequent encounter: Secondary | ICD-10-CM | POA: Diagnosis not present

## 2019-01-11 NOTE — Patient Instructions (Signed)
Good to see you Please call [facility] at [phone] to schedule your MRI  Please send me a message in MyChart with any questions or updates.  We will schedule a virtual visit once the MRI is resulted to discuss the findings.   --Dr. Raeford Razor

## 2019-01-11 NOTE — Progress Notes (Signed)
Maria Johnson - 38 y.o. female MRN [id number]  Date of birth: August 04, 1980  SUBJECTIVE:  Including CC & ROS.  Chief Complaint  Patient presents with  . Follow-up    follow up for left knee    Maria Johnson is a 38 y.o. female that is following up for her left knee pain.  The pain has acutely occurred.  Is still occurring over the medial femoral condyle.  She denies any mechanical symptoms.  She did get improvement with the injection.  She is still walking and losing weight.  Does have some instability from time to time.  Denies any lack of range of motion.   Review of Systems  Constitutional: Negative for fever.  HENT: Negative for congestion.   Respiratory: Negative for cough.   Cardiovascular: Negative for chest pain.  Gastrointestinal: Negative for abdominal distention.  Musculoskeletal: Negative for back pain.  Skin: Negative for color change.  Neurological: Negative for weakness.  Hematological: Negative for adenopathy.    HISTORY: Past Medical, Surgical, Social, and Family History Reviewed & Updated per EMR.   Pertinent Historical Findings include:  Past Medical History:  Diagnosis Date  . Acne    takes doxocycline daily for acne  . Anxiety   . Bronchitis    history of  . Carpal tunnel syndrome on both sides   . Chicken pox   . Depression   . Diabetes mellitus   . Headache(784.0)    hx of migraines  . Hyperlipidemia   . Sickle cell trait ([place])   . Sleep apnea    has cpap    Past Surgical History:  Procedure Laterality Date  . ANKLE SURGERY  05/2003   torn cartilage  repair; left  . ANTERIOR LUMBAR FUSION  01/06/2011   Procedure: ANTERIOR LUMBAR FUSION 1 LEVEL;  Surgeon: Peggyann Shoals, MD;  Location: [facility];  Service: Neurosurgery;  Laterality: N/A;  Lumbar five-Sacral one  Anterior Lumbar Interbody Fusion with Instrumentation   . BACK SURGERY     Laminectomy Sept 14, 2011,  . DIAGNOSTIC LAPAROSCOPY  01/2002   removed scar tissue  . LAMINECTOMY AND  MICRODISCECTOMY LUMBAR SPINE  10/2009   L4, L5, S1  . LUMBAR FUSION  01/06/11   L5-S1  . TONSILLECTOMY  03/2005  . VAGINAL HYSTERECTOMY  08/2007   partial; AUB    No Known Allergies  Family History  Problem Relation Age of Onset  . Miscarriages / [place] Mother   . Hypertension Mother   . Alcohol abuse Father   . Depression Father   . Drug abuse Father   . Hyperlipidemia Father   . Stroke Father   . Depression Daughter   . Diabetes Daughter   . Hypertension Daughter   . Miscarriages / [place] Daughter   . Birth defects Son   . Diabetes Maternal Grandmother   . Heart attack Maternal Grandmother   . Hypertension Maternal Grandmother   . Stroke Maternal Grandmother   . Diabetes Maternal Grandfather   . Hypertension Maternal Grandfather   . Diabetes Paternal Grandmother   . Lupus Paternal Grandmother      Social History   Socioeconomic History  . Marital status: Single    Spouse name: Not on file  . Number of children: 3  . Years of education: Not on file  . Highest education level: Master's degree (e.g., MA, MS, MEng, MEd, MSW, MBA)  Occupational History  . Not on file  Tobacco Use  . Smoking status: Never  Smoker  . Smokeless tobacco: Never Used  Substance and Sexual Activity  . Alcohol use: Yes    Comment: occassional  . Drug use: No  . Sexual activity: Yes    Birth control/protection: Surgical  Other Topics Concern  . Not on file  Social History Narrative  . Not on file   Social Determinants of Health   Financial Resource Strain:   . Difficulty of Paying Living Expenses: Not on file  Food Insecurity:   . Worried About Programme researcher, broadcasting/film/video in the Last Year: Not on file  . Ran Out of Food in the Last Year: Not on file  Transportation Needs:   . Lack of Transportation (Medical): Not on file  . Lack of Transportation (Non-Medical): Not on file  Physical Activity:   . Days of Exercise per Week: Not on file  . Minutes of Exercise per Session: Not  on file  Stress:   . Feeling of Stress : Not on file  Social Connections:   . Frequency of Communication with Friends and Family: Not on file  . Frequency of Social Gatherings with Friends and Family: Not on file  . Attends Religious Services: Not on file  . Active Member of Clubs or Organizations: Not on file  . Attends Banker Meetings: Not on file  . Marital Status: Not on file  Intimate Partner Violence:   . Fear of Current or Ex-Partner: Not on file  . Emotionally Abused: Not on file  . Physically Abused: Not on file  . Sexually Abused: Not on file     PHYSICAL EXAM:  VS: BP 123/86   Pulse 93   Ht 5\' 10"  (1.778 m)   Wt 226 lb (102.5 kg)   LMP 08/09/2007   BMI 32.43 kg/m  Physical Exam Gen: NAD, alert, cooperative with exam, well-appearing ENT: normal lips, normal nasal mucosa,  Eye: normal EOM, normal conjunctiva and lids CV:  no edema, +2 pedal pulses   Resp: no accessory muscle use, non-labored,  Skin: no rashes, no areas of induration  Neuro: normal tone, normal sensation to touch Psych:  normal insight, alert and oriented MSK:  Left knee: No obvious effusion. Normal range of motion. Instability with valgus varus stress testing. Tenderness palpation of the medial femoral condyle. Negative McMurray's test. No pain with patellar grind. Neurovascularly intact     ASSESSMENT & PLAN:   Sprain of medial collateral ligament of left knee Pain is still ongoing despite conservative measures as well as injection.  Concerned that there is concern of the origin of the MCL. -Counseled on home exercise therapy and supportive care. -MRI to evaluate for MCL tear.

## 2019-01-11 NOTE — Assessment & Plan Note (Signed)
Pain is still ongoing despite conservative measures as well as injection.  Concerned that there is concern of the origin of the MCL. -Counseled on home exercise therapy and supportive care. -MRI to evaluate for MCL tear.

## 2019-01-14 NOTE — Addendum Note (Signed)
Addended by: Rosemarie Ax on: 01/14/2019 02:55 PM   Modules accepted: Orders

## 2019-01-18 ENCOUNTER — Other Ambulatory Visit (HOSPITAL_COMMUNITY)
Admission: RE | Admit: 2019-01-18 | Discharge: 2019-01-18 | Disposition: A | Discharge status: Home / Self Care | Payer: BC Managed Care – PPO | Source: Ambulatory Visit | Attending: Family Medicine | Admitting: Family Medicine

## 2019-01-18 ENCOUNTER — Other Ambulatory Visit: Payer: Self-pay

## 2019-01-18 ENCOUNTER — Ambulatory Visit (INDEPENDENT_AMBULATORY_CARE_PROVIDER_SITE_OTHER): Payer: BC Managed Care – PPO | Admitting: Family Medicine

## 2019-01-18 ENCOUNTER — Encounter: Payer: Self-pay | Admitting: Family Medicine

## 2019-01-18 VITALS — BP 118/70 | HR 97 | Temp 97.1°F | Ht 70.0 in | Wt 230.0 lb

## 2019-01-18 DIAGNOSIS — Z114 Encounter for screening for human immunodeficiency virus [HIV]: Secondary | ICD-10-CM | POA: Diagnosis not present

## 2019-01-18 DIAGNOSIS — Z113 Encounter for screening for infections with a predominantly sexual mode of transmission: Secondary | ICD-10-CM | POA: Diagnosis not present

## 2019-01-18 DIAGNOSIS — Z0000 Encounter for general adult medical examination without abnormal findings: Secondary | ICD-10-CM | POA: Diagnosis not present

## 2019-01-18 LAB — CBC
HCT: 39.7 % (ref 36.0–46.0)
Hemoglobin: 12.7 g/dL (ref 12.0–15.0)
MCHC: 32 g/dL (ref 30.0–36.0)
MCV: 83.6 fl (ref 78.0–100.0)
Platelets: 379 10*3/uL (ref 150.0–400.0)
RBC: 4.75 Mil/uL (ref 3.87–5.11)
RDW: 14.7 % (ref 11.5–15.5)
WBC: 6.2 10*3/uL (ref 4.0–10.5)

## 2019-01-18 LAB — COMPREHENSIVE METABOLIC PANEL
ALT: 16 U/L (ref 0–35)
AST: 15 U/L (ref 0–37)
Albumin: 4.2 g/dL (ref 3.5–5.2)
Alkaline Phosphatase: 59 U/L (ref 39–117)
BUN: 11 mg/dL (ref 6–23)
CO2: 30 mEq/L (ref 19–32)
Calcium: 9.6 mg/dL (ref 8.4–10.5)
Chloride: 105 mEq/L (ref 96–112)
Creatinine, Ser: 0.99 mg/dL (ref 0.40–1.20)
GFR: 75.68 mL/min (ref >60.00)
Glucose, Bld: 121 mg/dL — ABNORMAL HIGH (ref 70–99)
Potassium: 3.7 mEq/L (ref 3.5–5.1)
Sodium: 139 mEq/L (ref 135–145)
Total Bilirubin: 0.3 mg/dL (ref 0.2–1.2)
Total Protein: 7.2 g/dL (ref 6.0–8.3)

## 2019-01-18 LAB — LIPID PANEL
Cholesterol: 208 mg/dL — ABNORMAL HIGH (ref 0–200)
HDL: 44.2 mg/dL (ref >39.00)
LDL Cholesterol: 141 mg/dL — ABNORMAL HIGH (ref 0–99)
NonHDL: 163.92
Total CHOL/HDL Ratio: 5
Triglycerides: 117 mg/dL (ref 0.0–149.0)
VLDL: 23.4 mg/dL (ref 0.0–40.0)

## 2019-01-18 LAB — HEMOGLOBIN A1C: Hgb A1c MFr Bld: 6.4 % (ref 4.6–6.5)

## 2019-01-18 LAB — MICROALBUMIN / CREATININE URINE RATIO
Creatinine,U: 162.5 mg/dL
Microalb Creat Ratio: 0.4 mg/g (ref 0.0–30.0)
Microalb, Ur: <0.7 mg/dL (ref 0.0–1.9)

## 2019-01-18 MED ORDER — KETOCONAZOLE 2 % EX CREA: 1.0000 "application " | TOPICAL_CREAM | Freq: Every day | CUTANEOUS | 0 refills | Status: DC

## 2019-01-18 NOTE — Progress Notes (Signed)
Chief Complaint  Patient presents with  . Annual Exam     Well Woman Maria Johnson is here for a complete physical.   Her last physical was >1 year ago.  Current diet: in general, a "healthy" diet. Current exercise: cardio, some wts Patient's last menstrual period was 08/09/2007. Seatbelt? Yes  Health Maintenance Tetanus- Yes HIV screening- Yes  Past Medical History:  Diagnosis Date  . Acne    takes doxocycline daily for acne  . Anxiety   . Bronchitis    history of  . Carpal tunnel syndrome on both sides   . Chicken pox   . Depression   . Diabetes mellitus   . Headache(784.0)    hx of migraines  . Hyperlipidemia   . Sickle cell trait ([place])   . Sleep apnea    has cpap     Past Surgical History:  Procedure Laterality Date  . ANKLE SURGERY  05/2003   torn cartilage  repair; left  . ANTERIOR LUMBAR FUSION  01/06/2011   Procedure: ANTERIOR LUMBAR FUSION 1 LEVEL;  Surgeon: Peggyann Shoals, MD;  Location: [facility];  Service: Neurosurgery;  Laterality: N/A;  Lumbar five-Sacral one  Anterior Lumbar Interbody Fusion with Instrumentation   . BACK SURGERY     Laminectomy Sept 14, 2011,  . DIAGNOSTIC LAPAROSCOPY  01/2002   removed scar tissue  . LAMINECTOMY AND MICRODISCECTOMY LUMBAR SPINE  10/2009   L4, L5, S1  . LUMBAR FUSION  01/06/11   L5-S1  . TONSILLECTOMY  03/2005  . VAGINAL HYSTERECTOMY  08/2007   partial; AUB    Medications  Current Outpatient Medications on File Prior to Visit  Medication Sig Dispense Refill  . ALPRAZolam (XANAX) 0.5 MG tablet TAKE 1 TABLET(0.5 MG) BY MOUTH DAILY AS NEEDED FOR ANXIETY 30 tablet 1  . atorvastatin (LIPITOR) 40 MG tablet Take 1 tablet (40 mg total) by mouth daily. 90 tablet 3  . Blood Glucose Monitoring Suppl (ONE TOUCH ULTRA MINI) w/Device KIT Use once daily to check blood sugar. 100 each 0  . butalbital-aspirin-caffeine-codeine (FIORINAL WITH CODEINE) 50-325-40-30 MG capsule Take 1 capsule by mouth every 6 (six) hours as  needed for pain. 16 capsule 0  . Diclofenac Sodium (PENNSAID) 2 % SOLN Place 1 application onto the skin 2 (two) times daily. 112 g 3  . glucose blood (ONE TOUCH ULTRA TEST) test strip Use once daily to check blood sugar. 100 each 0  . Lancets (ONETOUCH ULTRASOFT) lancets Check blood sugar once daily as instructed 100 each 0  . metFORMIN (GLUCOPHAGE-XR) 750 MG 24 hr tablet Take 1 tablet (750 mg total) by mouth daily with breakfast. 90 tablet 2  . Vitamin D, Ergocalciferol, (DRISDOL) 50000 units CAPS capsule Take 1 capsule (50,000 Units total) by mouth every 7 (seven) days. 12 capsule 0    Allergies No Known Allergies  Review of Systems: Constitutional:  no unexpected weight changes Eye:  no recent significant change in vision Ear/Nose/Mouth/Throat:  Ears:  no tinnitus or vertigo and no recent change in hearing Nose/Mouth/Throat:  no complaints of nasal congestion, no sore throat Cardiovascular: no chest pain Respiratory:  no cough and no shortness of breath Gastrointestinal:  no abdominal pain, no change in bowel habits GU:  Female: negative for dysuria or pelvic pain Musculoskeletal/Extremities:  no pain of the joints Integumentary (Skin/Breast):  no abnormal skin lesions reported Neurologic:  no headaches Endocrine:  denies fatigue Hematologic/Lymphatic:  No areas of easy bleeding  Exam BP 118/70 (  BP Location: Left Arm, Patient Position: Sitting, Cuff Size: Normal)   Pulse 97   Temp (!) 97.1 F (36.2 C) (Temporal)   Ht 5' 10"  (1.778 m)   Wt 230 lb (104.3 kg)   LMP 08/09/2007   SpO2 96%   BMI 33.00 kg/m  General:  well developed, well nourished, in no apparent distress Skin: macerated skin between 3rd and 4th, 4th and 5th digit, otherwise no significant moles, warts, or growths Head:  no masses, lesions, or tenderness Eyes:  pupils equal and round, sclera anicteric without injection Ears:  canals without lesions, TMs shiny without retraction, no obvious effusion, no  erythema Nose:  nares patent, septum midline, mucosa normal, and no drainage or sinus tenderness Throat/Pharynx:  lips and gingiva without lesion; tongue and uvula midline; non-inflamed pharynx; no exudates or postnasal drainage Neck: neck supple without adenopathy, thyromegaly, or masses Lungs:  clear to auscultation, breath sounds equal bilaterally, no respiratory distress Cardio:  regular rate and rhythm, no bruits, no LE edema Abdomen:  abdomen soft, nontender; bowel sounds normal; no masses or organomegaly Genital: Defer to GYN Musculoskeletal:  symmetrical muscle groups noted without atrophy or deformity Extremities:  no clubbing, cyanosis, or edema, no deformities, no skin discoloration Neuro:  gait normal; deep tendon reflexes normal and symmetric Psych: well oriented with normal range of affect and appropriate judgment/insight  Assessment and Plan  Well adult exam - Plan: CBC, Comp Met (CMET), HgB A1c, Lipid Profile, Urine Microalbumin w/creat. ratio  Screening for HIV (human immunodeficiency virus) - Plan: HIV antibody (with reflex)  Routine screening for STI (sexually transmitted infection) - Plan: Urine cytology ancillary only([place])   Well 38 y.o. female. Counseled on diet and exercise. GYN info given. I think she is done w screening though.  Other orders as above. Follow up in 6 mo or prn. The patient voiced understanding and agreement to the plan.  [place], DO 01/18/19 9:25 AM

## 2019-01-18 NOTE — Patient Instructions (Addendum)
Give us 2-3 business days to get the results of your labs back.   Keep the diet clean and stay active.  Call [facility] [place] at [phone] for an appointment.  They are located at [address], [address], [place], [place], [address] (right across the hall from our office).  Let us know if you need anything. 

## 2019-01-19 LAB — HIV ANTIBODY (ROUTINE TESTING W REFLEX): HIV 1&2 Ab, 4th Generation: NONREACTIVE

## 2019-01-21 LAB — URINE CYTOLOGY ANCILLARY ONLY
Chlamydia: NEGATIVE
Comment: NEGATIVE
Comment: NEGATIVE
Comment: NORMAL
Neisseria Gonorrhea: NEGATIVE
Trichomonas: NEGATIVE

## 2019-02-02 ENCOUNTER — Encounter: Payer: Self-pay | Admitting: Family Medicine

## 2019-02-08 ENCOUNTER — Ambulatory Visit
Admission: RE | Admit: 2019-02-08 | Discharge: 2019-02-08 | Disposition: A | Discharge status: Home / Self Care | Payer: Managed Care, Other (non HMO) | Source: Ambulatory Visit | Attending: Family Medicine | Admitting: Family Medicine

## 2019-02-08 DIAGNOSIS — S83412D Sprain of medial collateral ligament of left knee, subsequent encounter: Secondary | ICD-10-CM

## 2019-02-11 ENCOUNTER — Telehealth (INDEPENDENT_AMBULATORY_CARE_PROVIDER_SITE_OTHER): Payer: Managed Care, Other (non HMO) | Admitting: Family Medicine

## 2019-02-11 DIAGNOSIS — S83412D Sprain of medial collateral ligament of left knee, subsequent encounter: Secondary | ICD-10-CM

## 2019-02-11 NOTE — Assessment & Plan Note (Signed)
MRI revealing for subacute grade 2 sprain.  She has been feeling better since taking some time off from working out.  -Counseled on home exercise therapy and supportive care. -Discussed nitro patches but she has history of migraines. -Could consider PRP injections. -Can follow-up as needed.

## 2019-02-11 NOTE — Progress Notes (Signed)
Virtual Visit via Video Note  I connected with Maria Johnson on 02/11/19 at  1:30 PM EST by a video enabled telemedicine application and verified that I am speaking with the correct person using two identifiers.   I discussed the limitations of evaluation and management by telemedicine and the availability of in person appointments. The patient expressed understanding and agreed to proceed.  History of Present Illness:  Maria Johnson is a 39 year old female that is following up for her left knee pain.  MRI was done and revealing for a grade 2 subacute sprain.  She feels about 75% better.  She is not working out as much as she had been.  She denies any mechanical symptoms.   Observations/Objective:  Gen: NAD, alert, cooperative with exam, well-appearing   Assessment and Plan:  Left MCL sprain MRI revealing for subacute grade 2 sprain.  She has been feeling better since taking some time off from working out.  -Counseled on home exercise therapy and supportive care. -Discussed nitro patches but she has history of migraines. -Could consider PRP injections. -Can follow-up as needed.  Follow Up Instructions:    I discussed the assessment and treatment plan with the patient. The patient was provided an opportunity to ask questions and all were answered. The patient agreed with the plan and demonstrated an understanding of the instructions.   The patient was advised to call back or seek an in-person evaluation if the symptoms worsen or if the condition fails to improve as anticipated.    Clare Gandy, MD

## 2019-03-01 ENCOUNTER — Ambulatory Visit (INDEPENDENT_AMBULATORY_CARE_PROVIDER_SITE_OTHER): Payer: Managed Care, Other (non HMO) | Admitting: Medical

## 2019-03-01 ENCOUNTER — Encounter: Payer: Self-pay | Admitting: Family Medicine

## 2019-03-01 ENCOUNTER — Encounter: Payer: Self-pay | Admitting: Medical

## 2019-03-01 VITALS — BP 118/74 | Temp 97.3°F | Ht 70.5 in | Wt 223.0 lb

## 2019-03-01 DIAGNOSIS — H9209 Otalgia, unspecified ear: Secondary | ICD-10-CM

## 2019-03-01 DIAGNOSIS — R519 Headache, unspecified: Secondary | ICD-10-CM | POA: Diagnosis not present

## 2019-03-01 DIAGNOSIS — M791 Myalgia, unspecified site: Secondary | ICD-10-CM | POA: Diagnosis not present

## 2019-03-01 MED ORDER — NEOMYCIN-POLYMYXIN-HC 3.5-10000-1 OT SOLN: 3.0000 [drp] | Freq: Four times a day (QID) | OTIC | 0 refills | Status: DC

## 2019-03-01 MED ORDER — FLUTICASONE PROPIONATE 50 MCG/ACT NA SUSP: 2.0000 | Freq: Every day | NASAL | 1 refills | Status: DC

## 2019-03-01 NOTE — Patient Instructions (Addendum)
For recent faint sinus pressure, pnd and ear pain, I prescribedflonase and  cortisporint otic drops. If you get constant deep type ear pain then send me my chart message and will rx oral antibiotic.  Ibuprofen for mild body aches and ha(possible viral syndrome related?)  Rest.  Work note sent to my chart.  Get tested for covid. Recommend get tested on Monday. Today I think would be too early and if you have covid could get false negative result.  If signs/symptoms worsen or change notify us.  Follow up in 7 days or as needed

## 2019-03-01 NOTE — Progress Notes (Signed)
   Subjective:    Patient ID: Maria Johnson, female    DOB: [birth year], 39 y.o.   MRN: [id number]  HPI Virtual Visit via Video Note  I connected with Maria Johnson on 03/01/19 at  1:20 PM EST by a video enabled telemedicine application and verified that I am speaking with the correct person using two identifiers.  Location: Patient: home Provider: home   I discussed the limitations of evaluation and management by telemedicine and the availability of in person appointments. The patient expressed understanding and agreed to proceed.  History of Present Illness:  Pt states she has some mild diffuse body aches in arms, legs, mild ha, faint sinus pressure, mild ear pain and feels pnd.  No cough, no fever, no chills, no loss of smell, no sob, no nausea, no vomiting and no diarrhea.   Pt states no fatigue.  Pt denies any new exercise.  Pt states works from with [employer] in office 2 days a week.  Has minimal ear ache rt side.      Observations/Objective: General-no acute distress, pleasant, oriented. Lungs- on inspection lungs appear unlabored. Neck- no tracheal deviation or jvd on inspection. Neuro- gross motor function appears intact. Heent- frontal sinus pressure.  Assessment and Plan: For recent faint sinus pressure, pnd and ear pain, I prescribedflonase and  cortisporint otic drops. If you get constant deep type ear pain then send me my chart message and will rx oral antibiotic.  Ibuprofen for mild body aches.  Rest.  Work note sent to my chart.  Get tested for covid. Recommend get tested on Monday. Today I think would be too early and if you have covid could get false negative result.  If signs/symptoms worsen or change notify us.  Follow up in 7 days or as needed  Follow Up Instructions:    I discussed the assessment and treatment plan with the patient. The patient was provided an opportunity to ask questions and all were answered. The patient agreed with the  plan and demonstrated an understanding of the instructions.   The patient was advised to call back or seek an in-person evaluation if the symptoms worsen or if the condition fails to improve as anticipated.  I provided 20  minutes of non-face-to-face time during this encounter.   Esperanza Richters, PA-C    Review of Systems     Objective:   Physical Exam        Assessment & Plan:

## 2019-03-04 ENCOUNTER — Encounter: Payer: Self-pay | Admitting: Medical

## 2019-03-05 ENCOUNTER — Telehealth: Payer: Self-pay | Admitting: Medical

## 2019-03-05 ENCOUNTER — Encounter: Payer: Self-pay | Admitting: Family Medicine

## 2019-03-05 ENCOUNTER — Telehealth: Payer: Self-pay | Admitting: Family Medicine

## 2019-03-05 MED ORDER — AZITHROMYCIN 250 MG PO TABS: ORAL_TABLET | ORAL | 0 refills | Status: DC

## 2019-03-05 NOTE — Telephone Encounter (Signed)
Good morning, on Friday I had a telemedicine visit with Dr. Alvira Monday & he suggested that I get COVID tested & I did last night. It was positive. However, they wanted to treat my ear infection with an oral medication & since he said to send a message I didn't see the need for a complete evaluation from them. I sent him a message last night as well. So I'm just following up for the oral antibiotic that I need for my ear infection. Thank you

## 2019-03-05 NOTE — Telephone Encounter (Signed)
Rx azithrmycin sent to pt pharmcy. For ear infection. See pt my chart message.

## 2019-03-06 NOTE — Telephone Encounter (Signed)
See my chart message

## 2019-07-12 ENCOUNTER — Other Ambulatory Visit: Payer: Self-pay | Admitting: Family Medicine

## 2019-07-12 NOTE — Telephone Encounter (Signed)
Requesting: xanax Contract:11/14/2018 UDS:n/a Last Visit:03/01/19 Next Visit:07/26/19 Last Refill:01/05/2019  Please Advise

## 2019-07-12 NOTE — Telephone Encounter (Signed)
Last OV---01/18/2019 Next OV---07/26/2019 Last RF---01/05/2019---#30 with 1 refill

## 2019-07-19 ENCOUNTER — Ambulatory Visit: Payer: BC Managed Care – PPO | Admitting: Family Medicine

## 2019-07-26 ENCOUNTER — Ambulatory Visit: Payer: Managed Care, Other (non HMO) | Admitting: Family Medicine

## 2019-07-31 ENCOUNTER — Encounter: Payer: Self-pay | Admitting: Family Medicine

## 2019-07-31 ENCOUNTER — Telehealth (INDEPENDENT_AMBULATORY_CARE_PROVIDER_SITE_OTHER): Payer: Self-pay | Admitting: Family Medicine

## 2019-07-31 ENCOUNTER — Other Ambulatory Visit: Payer: Self-pay

## 2019-07-31 VITALS — BP 125/84 | HR 98 | Temp 96.4°F

## 2019-07-31 DIAGNOSIS — J029 Acute pharyngitis, unspecified: Secondary | ICD-10-CM

## 2019-07-31 MED ORDER — PREDNISONE 20 MG PO TABS: 40.0000 mg | ORAL_TABLET | Freq: Every day | ORAL | 0 refills | Status: AC

## 2019-07-31 MED ORDER — AMOXICILLIN 500 MG PO CAPS: 500.0000 mg | ORAL_CAPSULE | Freq: Every day | ORAL | 0 refills | Status: AC

## 2019-07-31 NOTE — Progress Notes (Signed)
Chief Complaint  Patient presents with  . Sore Throat    Maria Johnson List here for URI complaints. Due to COVID-19 pandemic, we are interacting via web portal for an electronic face-to-face visit. I verified patient's ID using 2 identifiers. Patient agreed to proceed with visit via this method. Patient is at home, I am at office. Patient and I are present for visit.   Duration: 4 days  Associated symptoms: rhinorrhea and sore throat Denies: cough, sinus congestion, sinus pain, itchy watery eyes, ear pain, ear drainage, wheezing, shortness of breath, myalgia and fevers Treatment to date: Nyquil, Dayquil Sick contacts: No  Past Medical History:  Diagnosis Date  . Acne    takes doxocycline daily for acne  . Anxiety   . Bronchitis    history of  . Carpal tunnel syndrome on both sides   . Chicken pox   . Depression   . Diabetes mellitus   . Headache(784.0)    hx of migraines  . Hyperlipidemia   . Sickle cell trait ([facility])   . Sleep apnea    has cpap    BP 125/84 (BP Location: Left Arm, Patient Position: Sitting, Cuff Size: Normal)   Pulse 98   Temp (!) 96.4 F (35.8 C) (Temporal)   LMP 08/09/2007  No conversational dyspnea Age appropriate judgment and insight Nml affect and mood  Sore throat - Plan: predniSONE (DELTASONE) 20 MG tablet, amoxicillin (AMOXIL) 500 MG capsule  Push fluids, Tylenol, salt water gargles, throat lozenges, air humidifier. Pred burst. If no better in 2-3 d, will take amox. Will throw out toothbrush in that scenario.  F/u prn. Pt voiced understanding and agreement to the plan.  Jilda Roche [place], DO 07/31/19 3:28 PM

## 2019-08-07 ENCOUNTER — Other Ambulatory Visit: Payer: Self-pay

## 2019-08-07 ENCOUNTER — Ambulatory Visit (INDEPENDENT_AMBULATORY_CARE_PROVIDER_SITE_OTHER): Payer: Managed Care, Other (non HMO) | Admitting: Family Medicine

## 2019-08-07 ENCOUNTER — Encounter: Payer: Self-pay | Admitting: Family Medicine

## 2019-08-07 VITALS — BP 112/78 | HR 94 | Temp 98.2°F | Ht 70.0 in | Wt 247.0 lb

## 2019-08-07 DIAGNOSIS — L680 Hirsutism: Secondary | ICD-10-CM

## 2019-08-07 DIAGNOSIS — E1169 Type 2 diabetes mellitus with other specified complication: Secondary | ICD-10-CM | POA: Diagnosis not present

## 2019-08-07 DIAGNOSIS — Z1159 Encounter for screening for other viral diseases: Secondary | ICD-10-CM | POA: Diagnosis not present

## 2019-08-07 DIAGNOSIS — E669 Obesity, unspecified: Secondary | ICD-10-CM

## 2019-08-07 HISTORY — DX: Hirsutism: L68.0

## 2019-08-07 LAB — COMPREHENSIVE METABOLIC PANEL
ALT: 20 U/L (ref 0–35)
AST: 15 U/L (ref 0–37)
Albumin: 4.1 g/dL (ref 3.5–5.2)
Alkaline Phosphatase: 65 U/L (ref 39–117)
BUN: 14 mg/dL (ref 6–23)
CO2: 27 mEq/L (ref 19–32)
Calcium: 9.6 mg/dL (ref 8.4–10.5)
Chloride: 102 mEq/L (ref 96–112)
Creatinine, Ser: 0.95 mg/dL (ref 0.40–1.20)
GFR: 79.14 mL/min (ref >60.00)
Glucose, Bld: 156 mg/dL — ABNORMAL HIGH (ref 70–99)
Potassium: 4.6 mEq/L (ref 3.5–5.1)
Sodium: 136 mEq/L (ref 135–145)
Total Bilirubin: 0.2 mg/dL (ref 0.2–1.2)
Total Protein: 6.7 g/dL (ref 6.0–8.3)

## 2019-08-07 LAB — LIPID PANEL
Cholesterol: 187 mg/dL (ref 0–200)
HDL: 44.4 mg/dL (ref >39.00)
LDL Cholesterol: 105 mg/dL — ABNORMAL HIGH (ref 0–99)
NonHDL: 142.61
Total CHOL/HDL Ratio: 4
Triglycerides: 189 mg/dL — ABNORMAL HIGH (ref 0.0–149.0)
VLDL: 37.8 mg/dL (ref 0.0–40.0)

## 2019-08-07 LAB — HEMOGLOBIN A1C: Hgb A1c MFr Bld: 7.3 % — ABNORMAL HIGH (ref 4.6–6.5)

## 2019-08-07 MED ORDER — ATORVASTATIN CALCIUM 40 MG PO TABS: 40.0000 mg | ORAL_TABLET | Freq: Every day | ORAL | 3 refills | Status: DC

## 2019-08-07 MED ORDER — SPIRONOLACTONE 50 MG PO TABS: 50.0000 mg | ORAL_TABLET | Freq: Two times a day (BID) | ORAL | 2 refills | Status: DC

## 2019-08-07 MED ORDER — METFORMIN HCL ER 750 MG PO TB24: 750.0000 mg | ORAL_TABLET | Freq: Every day | ORAL | 2 refills | Status: DC

## 2019-08-07 NOTE — Progress Notes (Signed)
Subjective:   Chief Complaint  Patient presents with  . Follow-up    Maria Johnson is a 39 y.o. female here for follow-up of diabetes.   Maria Johnson does not routinely check her sugars.  Patient does not require insulin.   Medications include: Metformin XR 750 mg/d Diet is starting to improve.  Exercise: walking, cardio, wt resistance exercise  Several mo of hair on chin. On Metformin. Has hx of cysts on ovaries. Has had partial hysterectomy.   Past Medical History:  Diagnosis Date  . Acne    takes doxocycline daily for acne  . Anxiety   . Bronchitis    history of  . Carpal tunnel syndrome on both sides   . Chicken pox   . Depression   . Diabetes mellitus   . Headache(784.0)    hx of migraines  . Hyperlipidemia   . Sickle cell trait ([facility])   . Sleep apnea    has cpap     Related testing: Date of retinal exam: Done Pneumovax: done  Objective:  BP 112/78 (BP Location: Left Arm, Patient Position: Sitting, Cuff Size: Normal)   Pulse 94   Temp 98.2 F (36.8 C) (Oral)   Ht 5\' 10"  (1.778 m)   Wt 247 lb (112 kg)   LMP 08/09/2007   SpO2 98%   BMI 35.44 kg/m  General:  Well developed, well nourished, in no apparent distress Eyes:  Pupils equal and round, sclera anicteric without injection  Skin: +female pattern facial hair on chin Lungs:  CTAB, no access msc use Cardio:  RRR, no bruits, no LE edema Psych: Age appropriate judgment and insight  Assessment:   Diabetes mellitus type 2 in obese ([facility]CC) - Plan: Comprehensive metabolic panel, Lipid panel, Hemoglobin A1c, atorvastatin (LIPITOR) 40 MG tablet, metFORMIN (GLUCOPHAGE-XR) 750 MG 24 hr tablet  Encounter for hepatitis C screening test for low risk patient - Plan: Hepatitis C antibody  Hirsutism - Plan: spironolactone (ALDACTONE) 50 MG tablet   Plan:   1. Cont Metformin and statin. Counseled on diet and exercise. Ck above. 2. Screen. 3. Start above bid, f/u with GYN if no better.  F/u in 3-6 mo. The patient  voiced understanding and agreement to the plan.  10/10/2007 [place], DO 08/07/19 7:20 AM

## 2019-08-07 NOTE — Patient Instructions (Addendum)
Give [place] 2-3 business days to get the results of your labs back. Our follow up is determined by your lab results.   If the hair does not improve over the next few weeks, reach out to the GYN team across the hall.  Keep the diet clean and stay active.  Let us know if you need anything.

## 2019-08-08 LAB — HEPATITIS C ANTIBODY
Hepatitis C Ab: NONREACTIVE
SIGNAL TO CUT-OFF: 0.05 (ref <1.00)

## 2019-08-08 MED ORDER — DAPAGLIFLOZIN PROPANEDIOL 10 MG PO TABS: 10.0000 mg | ORAL_TABLET | Freq: Every day | ORAL | 3 refills | Status: DC

## 2019-08-23 DIAGNOSIS — E782 Mixed hyperlipidemia: Secondary | ICD-10-CM

## 2019-08-23 HISTORY — DX: Mixed hyperlipidemia: E78.2

## 2019-09-11 ENCOUNTER — Other Ambulatory Visit: Payer: Self-pay | Admitting: Family Medicine

## 2019-09-11 MED ORDER — SITAGLIPTIN PHOSPHATE 100 MG PO TABS
100.0000 mg | ORAL_TABLET | Freq: Every day | ORAL | 3 refills | Status: DC
Start: 2019-09-11 — End: 2020-01-14

## 2019-09-11 MED ORDER — OZEMPIC (0.25 OR 0.5 MG/DOSE) 2 MG/1.5ML ~~LOC~~ SOPN
PEN_INJECTOR | SUBCUTANEOUS | 2 refills | Status: DC
Start: 2019-09-11 — End: 2019-09-11

## 2019-10-10 ENCOUNTER — Other Ambulatory Visit: Payer: Self-pay | Admitting: Family Medicine

## 2019-10-10 DIAGNOSIS — L680 Hirsutism: Secondary | ICD-10-CM

## 2019-11-08 ENCOUNTER — Encounter: Payer: Self-pay | Admitting: Family Medicine

## 2019-11-08 ENCOUNTER — Other Ambulatory Visit: Payer: Self-pay

## 2019-11-08 ENCOUNTER — Ambulatory Visit (INDEPENDENT_AMBULATORY_CARE_PROVIDER_SITE_OTHER): Payer: Managed Care, Other (non HMO) | Admitting: Family Medicine

## 2019-11-08 VITALS — BP 118/80 | HR 96 | Temp 98.4°F | Ht 70.0 in | Wt 244.1 lb

## 2019-11-08 DIAGNOSIS — E669 Obesity, unspecified: Secondary | ICD-10-CM | POA: Diagnosis not present

## 2019-11-08 DIAGNOSIS — E1169 Type 2 diabetes mellitus with other specified complication: Secondary | ICD-10-CM | POA: Diagnosis not present

## 2019-11-08 DIAGNOSIS — Z23 Encounter for immunization: Secondary | ICD-10-CM | POA: Diagnosis not present

## 2019-11-08 NOTE — Patient Instructions (Signed)
Give us 2-3 business days to get the results of your labs back.   Keep the diet clean and stay active.  Let us know if you need anything. 

## 2019-11-08 NOTE — Progress Notes (Signed)
Subjective:   Chief Complaint  Patient presents with  . Follow-up    3 month     Maria Johnson is a 39 y.o. female here for follow-up of diabetes.   Maria Johnson's self monitored glucose range is 90-110's. Patient denies hypoglycemic reactions. She checks her glucose levels 2 times per week. Patient does not require insulin.   Medications include: metformin XR 750 mg/d, Januvia 100 mg/d Diet is healthy.  Exercise: core, cardio  Past Medical History:  Diagnosis Date  . Acne    takes doxocycline daily for acne  . Anxiety   . Bronchitis    history of  . Carpal tunnel syndrome on both sides   . Chicken pox   . Depression   . Diabetes mellitus   . Headache(784.0)    hx of migraines  . Hyperlipidemia   . Sickle cell trait ([facility])   . Sleep apnea    has cpap     Related testing: Retinal exam: Done Pneumovax: done  Objective:  BP 118/80 (BP Location: Left Arm, Patient Position: Sitting, Cuff Size: Normal)   Pulse 96   Temp 98.4 F (36.9 C) (Oral)   Ht 5\' 10"  (1.778 m)   Wt 244 lb 2 oz (110.7 kg)   LMP 08/09/2007   SpO2 97%   BMI 35.03 kg/m  General:  Well developed, well nourished, in no apparent distress Lungs:  CTAB, no access msc use Cardio:  RRR, no bruits, no LE edema Psych: Age appropriate judgment and insight  Assessment:   Diabetes mellitus type 2 in obese (HCC) - Plan: Hemoglobin A1c  Need for influenza vaccination - Plan: Flu Vaccine QUAD 6+ mos PF IM (Fluarix Quad PF)   Plan:   Status: Uncontrolled. Counseled on diet and exercise. Will increase freq of metformin 750 mg XR from qd to bid. Cont Januvia 100 mg qd. Cont to monitor sugars. Flu shot today.  F/u in 3-6 mo pending results. The patient voiced understanding and agreement to the plan.  10/10/2007 [place], DO 11/08/19 4:09 PM

## 2019-11-09 ENCOUNTER — Other Ambulatory Visit: Payer: Self-pay | Admitting: Family Medicine

## 2019-11-09 DIAGNOSIS — E1169 Type 2 diabetes mellitus with other specified complication: Secondary | ICD-10-CM

## 2019-11-09 DIAGNOSIS — E669 Obesity, unspecified: Secondary | ICD-10-CM

## 2019-11-09 LAB — HEMOGLOBIN A1C
Hgb A1c MFr Bld: 7.2 % of total Hgb — ABNORMAL HIGH (ref <5.7)
Mean Plasma Glucose: 160 (calc)
eAG (mmol/L): 8.9 (calc)

## 2019-11-09 MED ORDER — METFORMIN HCL ER 750 MG PO TB24: 750.0000 mg | ORAL_TABLET | Freq: Two times a day (BID) | ORAL | 2 refills | Status: DC

## 2019-12-24 ENCOUNTER — Other Ambulatory Visit: Payer: Self-pay | Admitting: Family Medicine

## 2019-12-25 NOTE — Telephone Encounter (Signed)
Last OV--11/08/2019 Last RF--07/12/2019--#30 with 1 refill No CSC/UDS

## 2020-01-13 ENCOUNTER — Other Ambulatory Visit: Payer: Self-pay | Admitting: Family Medicine

## 2020-01-13 DIAGNOSIS — R079 Chest pain, unspecified: Secondary | ICD-10-CM

## 2020-01-14 ENCOUNTER — Other Ambulatory Visit: Payer: Self-pay | Admitting: Family Medicine

## 2020-01-20 ENCOUNTER — Other Ambulatory Visit: Payer: Self-pay

## 2020-01-20 DIAGNOSIS — E785 Hyperlipidemia, unspecified: Secondary | ICD-10-CM | POA: Insufficient documentation

## 2020-01-20 DIAGNOSIS — D573 Sickle-cell trait: Secondary | ICD-10-CM | POA: Insufficient documentation

## 2020-01-20 DIAGNOSIS — B019 Varicella without complication: Secondary | ICD-10-CM | POA: Insufficient documentation

## 2020-01-20 DIAGNOSIS — J40 Bronchitis, not specified as acute or chronic: Secondary | ICD-10-CM | POA: Insufficient documentation

## 2020-01-20 DIAGNOSIS — F32A Depression, unspecified: Secondary | ICD-10-CM | POA: Insufficient documentation

## 2020-01-20 DIAGNOSIS — G4730 Sleep apnea, unspecified: Secondary | ICD-10-CM | POA: Insufficient documentation

## 2020-01-20 DIAGNOSIS — G5603 Carpal tunnel syndrome, bilateral upper limbs: Secondary | ICD-10-CM | POA: Insufficient documentation

## 2020-01-21 ENCOUNTER — Encounter: Payer: Self-pay | Admitting: Cardiology

## 2020-01-21 ENCOUNTER — Ambulatory Visit (INDEPENDENT_AMBULATORY_CARE_PROVIDER_SITE_OTHER): Payer: Managed Care, Other (non HMO) | Admitting: Cardiology

## 2020-01-21 ENCOUNTER — Other Ambulatory Visit: Payer: Self-pay

## 2020-01-21 VITALS — BP 122/82 | HR 86 | Ht 70.0 in | Wt 247.1 lb

## 2020-01-21 DIAGNOSIS — R079 Chest pain, unspecified: Secondary | ICD-10-CM | POA: Diagnosis not present

## 2020-01-21 DIAGNOSIS — E8881 Metabolic syndrome: Secondary | ICD-10-CM

## 2020-01-21 DIAGNOSIS — E119 Type 2 diabetes mellitus without complications: Secondary | ICD-10-CM | POA: Diagnosis not present

## 2020-01-21 DIAGNOSIS — G4733 Obstructive sleep apnea (adult) (pediatric): Secondary | ICD-10-CM

## 2020-01-21 DIAGNOSIS — Z794 Long term (current) use of insulin: Secondary | ICD-10-CM

## 2020-01-21 MED ORDER — METOPROLOL TARTRATE 100 MG PO TABS: ORAL_TABLET | ORAL | 0 refills | Status: DC

## 2020-01-21 NOTE — Progress Notes (Signed)
Cardiology Office Note:    Date:  01/22/2020   ID:  SADYE KIERNAN, DOB May 06, 1980, MRN [id number]  PCP:  Sharlene Dory, DO  Cardiologist:  Thomasene Ripple, DO  Electrophysiologist:  None   Referring MD: Sharlene Dory*   " I have been having intermittent chest pain"  History of Present Illness:    Maria Johnson is a 39 y.o. female with a hx of diabetes mellitus, gestational diabetes, polycystic ovarian syndrome, obstructive sleep apnea on CPAP, obesity is here today to establish cardiac care.  The patient tells me today that she has been experiencing intermittent chest pain.  She described it as a midsternal sensation which is sharp at times but mostly dull.  She has had some associated shortness of breath this.  But she has not passed out.  No lightheadedness no dizziness.  Of note she tells me that she had 2 ED visits first she went to [facility] to be evaluated for chest pain due to the wait she left without being seen.  And then was seen at [facility] her blood work was normal EKG was normal but due to repeated visits to the ED she was asked to follow-up with cardiology.  Past Medical History:  Diagnosis Date   Acne    takes doxocycline daily for acne   Anxiety    Atopic dermatitis 04/04/2018   Bilateral carpal tunnel syndrome 04/04/2018   Bronchitis    history of   Carpal tunnel syndrome on both sides    Chest pain 01/08/2014   Last Assessment & Plan:  Formatting of this note might be different from the original. EKG completed and reviewed showing NSR without signs of ischemia.  I think this is related to anxiety and will see if this improves with treatment of this.   Chicken pox    Depression    Diabetes in pregnancy 12/10/2005   Last Assessment & Plan:  Formatting of this note is different from the original. Lab Results  Component Value Date   Hemoglobin A1c 6.2 (A) 08/05/2015   Hemoglobin A1c 6.2 (A) 03/03/2015   Hemoglobin A1c 6.0 (A)  08/12/2013   Lab Results  Component Value Date   LDL Calculated 144 (H) 08/05/2015   Creatinine, Serum 1.03 (H) 03/03/2015   Diabetes remains well controlled, continue current medications.   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   Pes planus 10/22/2015   Sickle cell trait (HCC)    Sleep apnea    has cpap   Sprain of medial collateral ligament of left knee 10/30/2018   Type 2 diabetes mellitus with hyperglycemia (HCC) 08/12/2013   Vitamin D deficiency 08/24/2017   Well adult exam 09/25/2017    Past Surgical History:  Procedure Laterality Date   ANKLE SURGERY  05/2003   torn cartilage  repair; left   ANTERIOR LUMBAR FUSION  01/06/2011   Procedure: ANTERIOR LUMBAR FUSION 1 LEVEL;  Surgeon: Dorian Heckle, MD;  Location: [facility];  Service: Neurosurgery;  Laterality: N/A;  Lumbar five-Sacral one  Anterior Lumbar Interbody Fusion with Instrumentation    BACK SURGERY     Laminectomy Sept 14, 2011,   DIAGNOSTIC LAPAROSCOPY  01/2002   removed scar tissue   LAMINECTOMY  AND MICRODISCECTOMY LUMBAR SPINE  10/2009   L4, L5, S1   LUMBAR FUSION  01/06/11   L5-S1   TONSILLECTOMY  03/2005   VAGINAL HYSTERECTOMY  08/2007   partial; AUB    Current Medications: Current Meds  Medication Sig   ALPRAZolam (XANAX) 0.5 MG tablet TAKE 1 TABLET(0.5 MG) BY MOUTH DAILY AS NEEDED FOR ANXIETY   atorvastatin (LIPITOR) 40 MG tablet Take 1 tablet (40 mg total) by mouth daily.   fluticasone (FLONASE) 50 MCG/ACT nasal spray Place 2 sprays into both nostrils daily.   JANUVIA 100 MG tablet TAKE 1 TABLET(100 MG) BY MOUTH DAILY   metFORMIN (GLUCOPHAGE-XR) 750 MG 24 hr tablet Take 1 tablet (750 mg total) by mouth in the morning and at bedtime.   spironolactone  (ALDACTONE) 50 MG tablet TAKE 1 TABLET(50 MG) BY MOUTH TWICE DAILY   [DISCONTINUED] azithromycin (ZITHROMAX) 500 MG tablet Take 500 mg by mouth daily.   [DISCONTINUED] Diclofenac Sodium (PENNSAID) 2 % SOLN Place onto the skin.   [DISCONTINUED] HYDROMET 5-1.5 MG/5ML syrup Take 5 mLs by mouth every 4 (four) hours as needed.   [DISCONTINUED] montelukast (SINGULAIR) 10 MG tablet Take 10 mg by mouth daily.   [DISCONTINUED] NEOMYCIN-POLYMYXIN-HYDROCORTISONE (CORTISPORIN) 1 % SOLN OTIC solution Place 3 drops in ear(s).   [DISCONTINUED] pravastatin (PRAVACHOL) 20 MG tablet Take 20 mg by mouth daily.     Allergies:   Patient has no known allergies.   Social History   Socioeconomic History   Marital status: Married    Spouse name: Not on file   Number of children: 3   Years of education: Not on file   Highest education level: Master's degree (e.g., MA, MS, MEng, MEd, MSW, MBA)  Occupational History   Not on file  Tobacco Use   Smoking status: Never Smoker   Smokeless tobacco: Never Used  Vaping Use   Vaping Use: Never used  Substance and Sexual Activity   Alcohol use: Yes    Comment: occassional   Drug use: No   Sexual activity: Yes    Birth control/protection: Surgical  Other Topics Concern   Not on file  Social History Narrative   Not on file   Social Determinants of Health   Financial Resource Strain: Not on file  Food Insecurity: Not on file  Transportation Needs: Not on file  Physical Activity: Not on file  Stress: Not on file  Social Connections: Not on file     Family History: The patient's family history includes Alcohol abuse in her father; Birth defects in her son; Depression in her daughter and father; Diabetes in her daughter, maternal grandfather, maternal grandmother, and paternal grandmother; Drug abuse in her father; Heart attack in her maternal grandmother; Hyperlipidemia in her father; Hypertension in her daughter, maternal grandfather,  maternal grandmother, and mother; Lupus in her paternal grandmother; Miscarriages / [place] in her daughter and mother; Stroke in her father and maternal grandmother.  ROS:   Review of Systems  Constitution: Negative for decreased appetite, fever and weight gain.  HENT: Negative for congestion, ear discharge, hoarse voice and sore throat.   Eyes: Negative for discharge, redness, vision loss in right eye and visual halos.  Cardiovascular: Negative for chest pain, dyspnea on exertion, leg swelling, orthopnea and palpitations.  Respiratory: Negative for cough, hemoptysis, shortness of breath and snoring.   Endocrine: Negative for heat intolerance and polyphagia.  Hematologic/Lymphatic: Negative for bleeding problem. Does not bruise/bleed easily.  Skin: Negative for flushing, nail changes, rash and  suspicious lesions.  Musculoskeletal: Negative for arthritis, joint pain, muscle cramps, myalgias, neck pain and stiffness.  Gastrointestinal: Negative for abdominal pain, bowel incontinence, diarrhea and excessive appetite.  Genitourinary: Negative for decreased libido, genital sores and incomplete emptying.  Neurological: Negative for brief paralysis, focal weakness, headaches and loss of balance.  Psychiatric/Behavioral: Negative for altered mental status, depression and suicidal ideas.  Allergic/Immunologic: Negative for HIV exposure and persistent infections.    EKGs/Labs/Other Studies Reviewed:    The following studies were reviewed today:   EKG:  The ekg ordered today demonstrates sinus rhythm, heart rate 86 bpm.  Recent Labs: 08/07/2019: ALT 20; BUN 14; Creatinine, Ser 0.95; Potassium 4.6; Sodium 136  Recent Lipid Panel    Component Value Date/Time   CHOL 187 08/07/2019 0719   TRIG 189.0 (H) 08/07/2019 0719   HDL 44.40 08/07/2019 0719   CHOLHDL 4 08/07/2019 0719   VLDL 37.8 08/07/2019 0719   LDLCALC 105 (H) 08/07/2019 0719    Physical Exam:    VS:  BP 122/82 (BP Location:  Right Arm)    Pulse 86    Ht 5\' 10"  (1.778 m)    Wt 247 lb 1.6 oz (112.1 kg)    LMP 08/09/2007    SpO2 98%    BMI 35.46 kg/m     Wt Readings from Last 3 Encounters:  01/21/20 247 lb 1.6 oz (112.1 kg)  11/08/19 244 lb 2 oz (110.7 kg)  08/07/19 247 lb (112 kg)     GEN: Well nourished, well developed in no acute distress HEENT: Normal NECK: No JVD; No carotid bruits LYMPHATICS: No lymphadenopathy CARDIAC: S1S2 noted,RRR, no murmurs, rubs, gallops RESPIRATORY:  Clear to auscultation without rales, wheezing or rhonchi  ABDOMEN: Soft, non-tender, non-distended, +bowel sounds, no guarding. EXTREMITIES: No edema, No cyanosis, no clubbing MUSCULOSKELETAL:  No deformity  SKIN: Warm and dry NEUROLOGIC:  Alert and oriented x 3, non-focal PSYCHIATRIC:  Normal affect, good insight  ASSESSMENT:    1. Chest pain of uncertain etiology   2. OSA (obstructive sleep apnea)   3. Morbid obesity (HCC)   4. Insulin-requiring or dependent type II diabetes mellitus (HCC)   5. Metabolic syndrome    PLAN:    Her chest pain is concerning given her risk factors I like to pursue an ischemic evaluation in this patient.  We've discussed multiple diagnostic testing and coronary CTA at this time will be appropriate.  She has no IV contrast allergy.  She is agreeable to proceed with this testing.  In addition for shortness of breath a transthoracic echocardiogram will be ordered to assess LV function and any other structural abnormalities.  We'll get vitamin D levels for metabolic syndrome to see if this is contributing to her fatigue. OSA continue with CPAP.  The patient understands the need to lose weight with diet and exercise. We have discussed specific strategies for this.   The patient is in agreement with the above plan. The patient left the office in stable condition.  The patient will follow up in 3 months or sooner if needed.   Medication Adjustments/Labs and Tests Ordered: Current medicines are  reviewed at length with the patient today.  Concerns regarding medicines are outlined above.  Orders Placed This Encounter  Procedures   CT CORONARY MORPH W/CTA COR W/SCORE W/CA W/CM &/OR WO/CM   CT CORONARY FRACTIONAL FLOW RESERVE DATA PREP   CT CORONARY FRACTIONAL FLOW RESERVE FLUID ANALYSIS   Basic metabolic panel   VITAMIN D 25 Hydroxy (Vit-D  Deficiency, Fractures)   EKG 12-Lead   ECHOCARDIOGRAM COMPLETE   Meds ordered this encounter  Medications   metoprolol tartrate (LOPRESSOR) 100 MG tablet    Sig: Take metoprolol 2 hours prior to CT    Dispense:  1 tablet    Refill:  0    Patient Instructions  Medication Instructions:  Your physician recommends that you continue on your current medications as directed. Please refer to the Current Medication list given to you today.  *If you need a refill on your cardiac medications before your next appointment, please call your pharmacy*   Lab Work: Your physician recommends that you return for lab work in: COME BACK ONE WEEK BEFORE CT TO GET BMP DONE AND CAN GET VIT D LEVEL THEN AS WELL  If you have labs (blood work) drawn today and your tests are completely normal, you will receive your results only by:  MyChart Message (if you have MyChart) OR  A paper copy in the mail If you have any lab test that is abnormal or we need to change your treatment, we will call you to review the results.   Testing/Procedures: Your physician has requested that you have an echocardiogram. Echocardiography is a painless test that uses sound waves to create images of your heart. It provides your doctor with information about the size and shape of your heart and how well your hearts chambers and valves are working. This procedure takes approximately one hour. There are no restrictions for this procedure.  Please arrive at the [facility] main entrance of [facility] at xx:xx AM (30-45 minutes prior to test start time)  [facility] [address] [place], [place] [address] [phone]  Proceed to the [facility] Radiology Department (First Floor).  Please follow these instructions carefully (unless otherwise directed):    On the Night Before the Test:  Drink plenty of water.  Do not consume any caffeinated/decaffeinated beverages or chocolate 12 hours prior to your test.  Do not take any antihistamines 12 hours prior to your test. On the Day of the Test:  Drink plenty of water. Do not drink any water within one hour of the test.  Do not eat any food 4 hours prior to the test.  You may take your regular medications prior to the test.  Take Lopressor 2 hours prior to CT After the Test:  Drink plenty of water.  After receiving IV contrast, you may experience a mild flushed feeling. This is normal.  On occasion, you may experience a mild rash up to 24 hours after the test. This is not dangerous. If this occurs, you can take Benadryl 25 mg and increase your fluid intake.  If you experience trouble breathing, this can be serious. If it is severe call 911 IMMEDIATELY. If it is mild, please call our office.  If you take any of these medications: Glipizide/Metformin, Avandament, Glucavance, please do not take 48 hours after completing test.    Follow-Up: At [facility], you and your health needs are our priority.  As part of our continuing mission to provide you with exceptional heart care, we have created designated Provider Care Teams.  These Care Teams include your primary Cardiologist (physician) and Advanced Practice Providers (APPs -  Physician Assistants and Nurse Practitioners) who all work together to provide you with the care you need, when you need it.  We recommend signing up for the patient portal called "MyChart".  Sign up information is provided  on this After Visit Summary.  MyChart is used to connect with patients for Virtual Visits (Telemedicine).  Patients are able to view  lab/test results, encounter notes, upcoming appointments, etc.  Non-urgent messages can be sent to your provider as well.   To learn more about what you can do with MyChart, go to [email or web address].    Your next appointment:   3 month(s)  The format for your next appointment:   In Person  Provider:   Thomasene Ripple, DO   Other Instructions  Cardiac CT Angiogram A cardiac CT angiogram is a procedure to look at the heart and the area around the heart. It may be done to help find the cause of chest pains or other symptoms of heart disease. During this procedure, a substance called contrast dye is injected into the blood vessels in the area to be checked. A large X-ray machine, called a CT scanner, then takes detailed pictures of the heart and the surrounding area. The procedure is also sometimes called a coronary CT angiogram, coronary artery scanning, or CTA. A cardiac CT angiogram allows the health care provider to see how well blood is flowing to and from the heart. The health care provider will be able to see if there are any problems, such as:  Blockage or narrowing of the coronary arteries in the heart.  Fluid around the heart.  Signs of weakness or disease in the muscles, valves, and tissues of the heart. Tell a health care provider about:  Any allergies you have. This is especially important if you have had a previous allergic reaction to contrast dye.  All medicines you are taking, including vitamins, herbs, eye drops, creams, and over-the-counter medicines.  Any blood disorders you have.  Any surgeries you have had.  Any medical conditions you have.  Whether you are pregnant or may be pregnant.  Any anxiety disorders, chronic pain, or other conditions you have that may increase your stress or prevent you from lying still. What are the risks? Generally, this is a safe procedure. However, problems may occur, including:  Bleeding.  Infection.  Allergic reactions  to medicines or dyes.  Damage to other structures or organs.  Kidney damage from the contrast dye that is used.  Increased risk of cancer from radiation exposure. This risk is low. Talk with your health care provider about: ? The risks and benefits of testing. ? How you can receive the lowest dose of radiation. What happens before the procedure?  Wear comfortable clothing and remove any jewelry, glasses, dentures, and hearing aids.  Follow instructions from your health care provider about eating and drinking. This may include: ? For 12 hours before the procedure -- avoid caffeine. This includes tea, coffee, soda, energy drinks, and diet pills. Drink plenty of water or other fluids that do not have caffeine in them. Being well hydrated can prevent complications. ? For 4-6 hours before the procedure -- stop eating and drinking. The contrast dye can cause nausea, but this is less likely if your stomach is empty.  Ask your health care provider about changing or stopping your regular medicines. This is especially important if you are taking diabetes medicines, blood thinners, or medicines to treat problems with erections (erectile dysfunction). What happens during the procedure?   Hair on your chest may need to be removed so that small sticky patches called electrodes can be placed on your chest. These will transmit information that helps to monitor your heart during the procedure.  An  IV will be inserted into one of your veins.  You might be given a medicine to control your heart rate during the procedure. This will help to ensure that good images are obtained.  You will be asked to lie on an exam table. This table will slide in and out of the CT machine during the procedure.  Contrast dye will be injected into the IV. You might feel warm, or you may get a metallic taste in your mouth.  You will be given a medicine called nitroglycerin. This will relax or dilate the arteries in your  heart.  The table that you are lying on will move into the CT machine tunnel for the scan.  The person running the machine will give you instructions while the scans are being done. You may be asked to: ? Keep your arms above your head. ? Hold your breath. ? Stay very still, even if the table is moving.  When the scanning is complete, you will be moved out of the machine.  The IV will be removed. The procedure may vary among health care providers and hospitals. What can I expect after the procedure? After your procedure, it is common to have:  A metallic taste in your mouth from the contrast dye.  A feeling of warmth.  A headache from the nitroglycerin. Follow these instructions at home:  Take over-the-counter and prescription medicines only as told by your health care provider.  If you are told, drink enough fluid to keep your urine pale yellow. This will help to flush the contrast dye out of your body.  Most people can return to their normal activities right after the procedure. Ask your health care provider what activities are safe for you.  It is up to you to get the results of your procedure. Ask your health care provider, or the department that is doing the procedure, when your results will be ready.  Keep all follow-up visits as told by your health care provider. This is important. Contact a health care provider if:  You have any symptoms of allergy to the contrast dye. These include: ? Shortness of breath. ? Rash or hives. ? A racing heartbeat. Summary  A cardiac CT angiogram is a procedure to look at the heart and the area around the heart. It may be done to help find the cause of chest pains or other symptoms of heart disease.  During this procedure, a large X-ray machine, called a CT scanner, takes detailed pictures of the heart and the surrounding area after a contrast dye has been injected into blood vessels in the area.  Ask your health care provider about  changing or stopping your regular medicines before the procedure. This is especially important if you are taking diabetes medicines, blood thinners, or medicines to treat erectile dysfunction.  If you are told, drink enough fluid to keep your urine pale yellow. This will help to flush the contrast dye out of your body. This information is not intended to replace advice given to you by your health care provider. Make sure you discuss any questions you have with your health care provider. Document Revised: 09/12/2018 Document Reviewed: 09/12/2018 Elsevier Patient Education  2020 [employer].  Echocardiogram An echocardiogram is a procedure that uses painless sound waves (ultrasound) to produce an image of the heart. Images from an echocardiogram can provide important information about:  Signs of coronary artery disease (CAD).  Aneurysm detection. An aneurysm is a weak or damaged part of  an artery wall that bulges out from the normal force of blood pumping through the body.  Heart size and shape. Changes in the size or shape of the heart can be associated with certain conditions, including heart failure, aneurysm, and CAD.  Heart muscle function.  Heart valve function.  Signs of a past heart attack.  Fluid buildup around the heart.  Thickening of the heart muscle.  A tumor or infectious growth around the heart valves. Tell a health care provider about:  Any allergies you have.  All medicines you are taking, including vitamins, herbs, eye drops, creams, and over-the-counter medicines.  Any blood disorders you have.  Any surgeries you have had.  Any medical conditions you have.  Whether you are pregnant or may be pregnant. What are the risks? Generally, this is a safe procedure. However, problems may occur, including:  Allergic reaction to dye (contrast) that may be used during the procedure. What happens before the procedure? No specific preparation is needed. You may eat  and drink normally. What happens during the procedure?   An IV tube may be inserted into one of your veins.  You may receive contrast through this tube. A contrast is an injection that improves the quality of the pictures from your heart.  A gel will be applied to your chest.  A wand-like tool (transducer) will be moved over your chest. The gel will help to transmit the sound waves from the transducer.  The sound waves will harmlessly bounce off of your heart to allow the heart images to be captured in real-time motion. The images will be recorded on a computer. The procedure may vary among health care providers and hospitals. What happens after the procedure?  You may return to your normal, everyday life, including diet, activities, and medicines, unless your health care provider tells you not to do that. Summary  An echocardiogram is a procedure that uses painless sound waves (ultrasound) to produce an image of the heart.  Images from an echocardiogram can provide important information about the size and shape of your heart, heart muscle function, heart valve function, and fluid buildup around your heart.  You do not need to do anything to prepare before this procedure. You may eat and drink normally.  After the echocardiogram is completed, you may return to your normal, everyday life, unless your health care provider tells you not to do that. This information is not intended to replace advice given to you by your health care provider. Make sure you discuss any questions you have with your health care provider. Document Revised: 05/10/2018 Document Reviewed: 02/20/2016 Elsevier Patient Education  2020 [employer].      Adopting a Healthy Lifestyle.  Know what a healthy weight is for you (roughly BMI <25) and aim to maintain this   Aim for 7+ servings of fruits and vegetables daily   65-80+ fluid ounces of water or unsweet tea for healthy kidneys   Limit to max 1 drink of  alcohol per day; avoid smoking/tobacco   Limit animal fats in diet for cholesterol and heart health - choose grass fed whenever available   Avoid highly processed foods, and foods high in saturated/trans fats   Aim for low stress - take time to unwind and care for your mental health   Aim for 150 min of moderate intensity exercise weekly for heart health, and weights twice weekly for bone health   Aim for 7-9 hours of sleep daily   When it  comes to diets, agreement about the perfect plan isnt easy to find, even among the experts. Experts at the [facility] of [employer] developed an idea known as the Healthy Eating Plate. Just imagine a plate divided into logical, healthy portions.   The emphasis is on diet quality:   Load up on vegetables and fruits - one-half of your plate: Aim for color and variety, and remember that potatoes dont count.   Go for whole grains - one-quarter of your plate: Whole wheat, barley, wheat berries, quinoa, oats, brown rice, and foods made with them. If you want pasta, go with whole wheat pasta.   Protein power - one-quarter of your plate: Fish, chicken, beans, and nuts are all healthy, versatile protein sources. Limit red meat.   The diet, however, does go beyond the plate, offering a few other suggestions.   Use healthy plant oils, such as olive, canola, soy, corn, sunflower and peanut. Check the labels, and avoid partially hydrogenated oil, which have unhealthy trans fats.   If youre thirsty, drink water. Coffee and tea are good in moderation, but skip sugary drinks and limit milk and dairy products to one or two daily servings.   The type of carbohydrate in the diet is more important than the amount. Some sources of carbohydrates, such as vegetables, fruits, whole grains, and beans-are healthier than others.   Finally, stay active  Signed, Thomasene Ripple, DO  01/22/2020 7:48 AM    [facility]

## 2020-01-21 NOTE — Patient Instructions (Addendum)
Medication Instructions:  Your physician recommends that you continue on your current medications as directed. Please refer to the Current Medication list given to you today.  *If you need a refill on your cardiac medications before your next appointment, please call your pharmacy*   Lab Work: Your physician recommends that you return for lab work in: COME BACK ONE WEEK BEFORE CT TO GET BMP DONE AND CAN GET VIT D LEVEL THEN AS WELL  If you have labs (blood work) drawn today and your tests are completely normal, you will receive your results only by: Marland Kitchen MyChart Message (if you have MyChart) OR . A paper copy in the mail If you have any lab test that is abnormal or we need to change your treatment, we will call you to review the results.   Testing/Procedures: Your physician has requested that you have an echocardiogram. Echocardiography is a painless test that uses sound waves to create images of your heart. It provides your doctor with information about the size and shape of your heart and how well your heart's chambers and valves are working. This procedure takes approximately one hour. There are no restrictions for this procedure.  Please arrive at the [facility] main entrance of [facility] at xx:xx AM (30-45 minutes prior to test start time)  [facility] [address] [place], [place] [address] [phone]  Proceed to the [facility] Radiology Department (First Floor).  Please follow these instructions carefully (unless otherwise directed):    On the Night Before the Test: . Drink plenty of water. . Do not consume any caffeinated/decaffeinated beverages or chocolate 12 hours prior to your test. . Do not take any antihistamines 12 hours prior to your test. On the Day of the Test: . Drink plenty of water. Do not drink any water within one hour of the test. . Do not eat any food 4 hours prior to the test. . You may take your regular medications prior to  the test. . Take Lopressor 2 hours prior to CT After the Test: . Drink plenty of water. . After receiving IV contrast, you may experience a mild flushed feeling. This is normal. . On occasion, you may experience a mild rash up to 24 hours after the test. This is not dangerous. If this occurs, you can take Benadryl 25 mg and increase your fluid intake. . If you experience trouble breathing, this can be serious. If it is severe call 911 IMMEDIATELY. If it is mild, please call our office. . If you take any of these medications: Glipizide/Metformin, Avandament, Glucavance, please do not take 48 hours after completing test.    Follow-Up: At [facility], you and your health needs are our priority.  As part of our continuing mission to provide you with exceptional heart care, we have created designated Provider Care Teams.  These Care Teams include your primary Cardiologist (physician) and Advanced Practice Providers (APPs -  Physician Assistants and Nurse Practitioners) who all work together to provide you with the care you need, when you need it.  We recommend signing up for the patient portal called "MyChart".  Sign up information is provided on this After Visit Summary.  MyChart is used to connect with patients for Virtual Visits (Telemedicine).  Patients are able to view lab/test results, encounter notes, upcoming appointments, etc.  Non-urgent messages can be sent to your provider as well.   To learn more about what you can do with MyChart, go to [email or web address].  Your next appointment:   3 month(s)  The format for your next appointment:   In Person  Provider:   Thomasene RippleKardie Tobb, DO   Other Instructions  Cardiac CT Angiogram A cardiac CT angiogram is a procedure to look at the heart and the area around the heart. It may be done to help find the cause of chest pains or other symptoms of heart disease. During this procedure, a substance called contrast dye is injected into the  blood vessels in the area to be checked. A large X-ray machine, called a CT scanner, then takes detailed pictures of the heart and the surrounding area. The procedure is also sometimes called a coronary CT angiogram, coronary artery scanning, or CTA. A cardiac CT angiogram allows the health care provider to see how well blood is flowing to and from the heart. The health care provider will be able to see if there are any problems, such as:  Blockage or narrowing of the coronary arteries in the heart.  Fluid around the heart.  Signs of weakness or disease in the muscles, valves, and tissues of the heart. Tell a health care provider about:  Any allergies you have. This is especially important if you have had a previous allergic reaction to contrast dye.  All medicines you are taking, including vitamins, herbs, eye drops, creams, and over-the-counter medicines.  Any blood disorders you have.  Any surgeries you have had.  Any medical conditions you have.  Whether you are pregnant or may be pregnant.  Any anxiety disorders, chronic pain, or other conditions you have that may increase your stress or prevent you from lying still. What are the risks? Generally, this is a safe procedure. However, problems may occur, including:  Bleeding.  Infection.  Allergic reactions to medicines or dyes.  Damage to other structures or organs.  Kidney damage from the contrast dye that is used.  Increased risk of cancer from radiation exposure. This risk is low. Talk with your health care provider about: ? The risks and benefits of testing. ? How you can receive the lowest dose of radiation. What happens before the procedure?  Wear comfortable clothing and remove any jewelry, glasses, dentures, and hearing aids.  Follow instructions from your health care provider about eating and drinking. This may include: ? For 12 hours before the procedure -- avoid caffeine. This includes tea, coffee, soda,  energy drinks, and diet pills. Drink plenty of water or other fluids that do not have caffeine in them. Being well hydrated can prevent complications. ? For 4-6 hours before the procedure -- stop eating and drinking. The contrast dye can cause nausea, but this is less likely if your stomach is empty.  Ask your health care provider about changing or stopping your regular medicines. This is especially important if you are taking diabetes medicines, blood thinners, or medicines to treat problems with erections (erectile dysfunction). What happens during the procedure?   Hair on your chest may need to be removed so that small sticky patches called electrodes can be placed on your chest. These will transmit information that helps to monitor your heart during the procedure.  An IV will be inserted into one of your veins.  You might be given a medicine to control your heart rate during the procedure. This will help to ensure that good images are obtained.  You will be asked to lie on an exam table. This table will slide in and out of the CT machine during the procedure.  Contrast dye will be injected into the IV. You might feel warm, or you may get a metallic taste in your mouth.  You will be given a medicine called nitroglycerin. This will relax or dilate the arteries in your heart.  The table that you are lying on will move into the CT machine tunnel for the scan.  The person running the machine will give you instructions while the scans are being done. You may be asked to: ? Keep your arms above your head. ? Hold your breath. ? Stay very still, even if the table is moving.  When the scanning is complete, you will be moved out of the machine.  The IV will be removed. The procedure may vary among health care providers and hospitals. What can I expect after the procedure? After your procedure, it is common to have:  A metallic taste in your mouth from the contrast dye.  A feeling of  warmth.  A headache from the nitroglycerin. Follow these instructions at home:  Take over-the-counter and prescription medicines only as told by your health care provider.  If you are told, drink enough fluid to keep your urine pale yellow. This will help to flush the contrast dye out of your body.  Most people can return to their normal activities right after the procedure. Ask your health care provider what activities are safe for you.  It is up to you to get the results of your procedure. Ask your health care provider, or the department that is doing the procedure, when your results will be ready.  Keep all follow-up visits as told by your health care provider. This is important. Contact a health care provider if:  You have any symptoms of allergy to the contrast dye. These include: ? Shortness of breath. ? Rash or hives. ? A racing heartbeat. Summary  A cardiac CT angiogram is a procedure to look at the heart and the area around the heart. It may be done to help find the cause of chest pains or other symptoms of heart disease.  During this procedure, a large X-ray machine, called a CT scanner, takes detailed pictures of the heart and the surrounding area after a contrast dye has been injected into blood vessels in the area.  Ask your health care provider about changing or stopping your regular medicines before the procedure. This is especially important if you are taking diabetes medicines, blood thinners, or medicines to treat erectile dysfunction.  If you are told, drink enough fluid to keep your urine pale yellow. This will help to flush the contrast dye out of your body. This information is not intended to replace advice given to you by your health care provider. Make sure you discuss any questions you have with your health care provider. Document Revised: 09/12/2018 Document Reviewed: 09/12/2018 Elsevier Patient Education  2020 [employer].  Echocardiogram An  echocardiogram is a procedure that uses painless sound waves (ultrasound) to produce an image of the heart. Images from an echocardiogram can provide important information about:  Signs of coronary artery disease (CAD).  Aneurysm detection. An aneurysm is a weak or damaged part of an artery wall that bulges out from the normal force of blood pumping through the body.  Heart size and shape. Changes in the size or shape of the heart can be associated with certain conditions, including heart failure, aneurysm, and CAD.  Heart muscle function.  Heart valve function.  Signs of a past heart attack.  Fluid buildup  around the heart.  Thickening of the heart muscle.  A tumor or infectious growth around the heart valves. Tell a health care provider about:  Any allergies you have.  All medicines you are taking, including vitamins, herbs, eye drops, creams, and over-the-counter medicines.  Any blood disorders you have.  Any surgeries you have had.  Any medical conditions you have.  Whether you are pregnant or may be pregnant. What are the risks? Generally, this is a safe procedure. However, problems may occur, including:  Allergic reaction to dye (contrast) that may be used during the procedure. What happens before the procedure? No specific preparation is needed. You may eat and drink normally. What happens during the procedure?   An IV tube may be inserted into one of your veins.  You may receive contrast through this tube. A contrast is an injection that improves the quality of the pictures from your heart.  A gel will be applied to your chest.  A wand-like tool (transducer) will be moved over your chest. The gel will help to transmit the sound waves from the transducer.  The sound waves will harmlessly bounce off of your heart to allow the heart images to be captured in real-time motion. The images will be recorded on a computer. The procedure may vary among health care  providers and hospitals. What happens after the procedure?  You may return to your normal, everyday life, including diet, activities, and medicines, unless your health care provider tells you not to do that. Summary  An echocardiogram is a procedure that uses painless sound waves (ultrasound) to produce an image of the heart.  Images from an echocardiogram can provide important information about the size and shape of your heart, heart muscle function, heart valve function, and fluid buildup around your heart.  You do not need to do anything to prepare before this procedure. You may eat and drink normally.  After the echocardiogram is completed, you may return to your normal, everyday life, unless your health care provider tells you not to do that. This information is not intended to replace advice given to you by your health care provider. Make sure you discuss any questions you have with your health care provider. Document Revised: 05/10/2018 Document Reviewed: 02/20/2016 Elsevier Patient Education  2020 [employer].

## 2020-01-22 DIAGNOSIS — G4733 Obstructive sleep apnea (adult) (pediatric): Secondary | ICD-10-CM | POA: Insufficient documentation

## 2020-01-22 DIAGNOSIS — Z794 Long term (current) use of insulin: Secondary | ICD-10-CM | POA: Insufficient documentation

## 2020-01-22 DIAGNOSIS — E119 Type 2 diabetes mellitus without complications: Secondary | ICD-10-CM | POA: Insufficient documentation

## 2020-01-22 DIAGNOSIS — R079 Chest pain, unspecified: Secondary | ICD-10-CM | POA: Insufficient documentation

## 2020-01-22 DIAGNOSIS — E8881 Metabolic syndrome: Secondary | ICD-10-CM | POA: Insufficient documentation

## 2020-02-04 ENCOUNTER — Other Ambulatory Visit: Payer: Self-pay | Admitting: Family Medicine

## 2020-02-04 DIAGNOSIS — L680 Hirsutism: Secondary | ICD-10-CM

## 2020-02-05 ENCOUNTER — Telehealth: Payer: Self-pay

## 2020-02-05 LAB — BASIC METABOLIC PANEL
BUN/Creatinine Ratio: 11 (ref 9–23)
BUN: 11 mg/dL (ref 6–20)
CO2: 23 mmol/L (ref 20–29)
Calcium: 9.6 mg/dL (ref 8.7–10.2)
Chloride: 104 mmol/L (ref 96–106)
Creatinine, Ser: 0.99 mg/dL (ref 0.57–1.00)
GFR calc Af Amer: 83 mL/min/{1.73_m2} (ref >59)
GFR calc non Af Amer: 72 mL/min/{1.73_m2} (ref >59)
Glucose: 118 mg/dL — ABNORMAL HIGH (ref 65–99)
Potassium: 4.5 mmol/L (ref 3.5–5.2)
Sodium: 140 mmol/L (ref 134–144)

## 2020-02-05 LAB — VITAMIN D 25 HYDROXY (VIT D DEFICIENCY, FRACTURES): Vit D, 25-Hydroxy: 8.9 ng/mL — ABNORMAL LOW (ref 30.0–100.0)

## 2020-02-05 MED ORDER — VITAMIN D (ERGOCALCIFEROL) 1.25 MG (50000 UNIT) PO CAPS: 50000.0000 [IU] | ORAL_CAPSULE | ORAL | 0 refills | Status: DC

## 2020-02-05 NOTE — Telephone Encounter (Signed)
-----   Message from Thomasene Ripple, DO sent at 02/05/2020  4:25 PM EST ----- Your vitamin D level is very low is 8.9, it should be greater than 30.  I like to treat you for vitamin D deficiency 50,000 units once a week for 12 weeks.  The other labs are normal

## 2020-02-05 NOTE — Telephone Encounter (Signed)
Patient notified of the following per Dr. Servando Salina and  verbalized understanding. Patient  aware Rx sent.

## 2020-02-10 ENCOUNTER — Telehealth (HOSPITAL_COMMUNITY): Payer: Self-pay | Admitting: Emergency Medicine

## 2020-02-10 NOTE — Telephone Encounter (Signed)
Reaching out to patient to offer assistance regarding upcoming cardiac imaging study; pt verbalizes understanding of appt date/time, parking situation and where to check in, pre-test NPO status and medications ordered, and verified current allergies; name and call back number provided for further questions should they arise Cathern Tahir RN Navigator Cardiac Imaging [facility] [phone] office [phone] cell 

## 2020-02-11 ENCOUNTER — Ambulatory Visit: Payer: Managed Care, Other (non HMO) | Admitting: Family Medicine

## 2020-02-12 ENCOUNTER — Encounter (HOSPITAL_COMMUNITY): Payer: Self-pay

## 2020-02-12 ENCOUNTER — Other Ambulatory Visit: Payer: Self-pay

## 2020-02-12 ENCOUNTER — Ambulatory Visit (HOSPITAL_COMMUNITY)
Admission: RE | Admit: 2020-02-12 | Discharge: 2020-02-12 | Disposition: A | Discharge status: Home / Self Care | Payer: Managed Care, Other (non HMO) | Source: Ambulatory Visit | Attending: Cardiology | Admitting: Cardiology

## 2020-02-12 ENCOUNTER — Encounter: Payer: Managed Care, Other (non HMO) | Admitting: *Deleted

## 2020-02-12 DIAGNOSIS — G4733 Obstructive sleep apnea (adult) (pediatric): Secondary | ICD-10-CM

## 2020-02-12 DIAGNOSIS — Z006 Encounter for examination for normal comparison and control in clinical research program: Secondary | ICD-10-CM

## 2020-02-12 DIAGNOSIS — R079 Chest pain, unspecified: Secondary | ICD-10-CM

## 2020-02-12 MED ORDER — NITROGLYCERIN 0.4 MG SL SUBL
0.8000 mg | SUBLINGUAL_TABLET | Freq: Once | SUBLINGUAL | Status: AC
  Administered 2020-02-12: 0.8 mg via SUBLINGUAL

## 2020-02-12 MED ORDER — IOHEXOL 350 MG/ML SOLN
80.0000 mL | Freq: Once | INTRAVENOUS | Status: AC | PRN
  Administered 2020-02-12: 80 mL via INTRAVENOUS

## 2020-02-12 MED ORDER — NITROGLYCERIN 0.4 MG SL SUBL
SUBLINGUAL_TABLET | SUBLINGUAL | Status: AC
  Filled 2020-02-12: qty 2

## 2020-02-12 NOTE — Progress Notes (Signed)
Called ct [phone] to see if this pt can come over for heart scan; tech states they have a pt at this time, they will call back when the room is available.

## 2020-02-12 NOTE — Research (Signed)
IDENTIFY Informed Consent      Subject Name:  Maria Johnson. Maria Johnson                  Consent signed at : 07:00 am   Subject met inclusion and exclusion criteria.  The informed consent form, study requirements and expectations were reviewed with the subject and questions and concerns were addressed prior to the signing of the consent form.  The subject verbalized understanding of the trial requirements.  The subject agreed to participate in the IDENTIFY trial and signed the informed consent.  The informed consent was obtained prior to performance of any protocol-specific procedures for the subject.  A copy of the signed informed consent was given to the subject and a copy was placed in the subject's medical record.   Shirley Muscat, RN

## 2020-02-17 ENCOUNTER — Ambulatory Visit (HOSPITAL_BASED_OUTPATIENT_CLINIC_OR_DEPARTMENT_OTHER): Payer: Managed Care, Other (non HMO)

## 2020-02-17 ENCOUNTER — Ambulatory Visit: Payer: Managed Care, Other (non HMO) | Admitting: Family Medicine

## 2020-02-27 ENCOUNTER — Ambulatory Visit (HOSPITAL_BASED_OUTPATIENT_CLINIC_OR_DEPARTMENT_OTHER)
Admission: RE | Admit: 2020-02-27 | Discharge: 2020-02-27 | Disposition: A | Discharge status: Home / Self Care | Payer: Managed Care, Other (non HMO) | Source: Ambulatory Visit | Attending: Cardiology | Admitting: Cardiology

## 2020-02-27 ENCOUNTER — Other Ambulatory Visit: Payer: Self-pay

## 2020-02-27 DIAGNOSIS — R0602 Shortness of breath: Secondary | ICD-10-CM

## 2020-02-27 DIAGNOSIS — G4733 Obstructive sleep apnea (adult) (pediatric): Secondary | ICD-10-CM

## 2020-02-27 DIAGNOSIS — R079 Chest pain, unspecified: Secondary | ICD-10-CM

## 2020-02-27 LAB — ECHOCARDIOGRAM COMPLETE
AR max vel: 2.07 cm2
AV Area VTI: 2.13 cm2
AV Area mean vel: 1.97 cm2
AV Mean grad: 4 mmHg
AV Peak grad: 10.1 mmHg
Ao pk vel: 1.59 m/s
Area-P 1/2: 3.89 cm2
Calc EF: 61.8 %
S' Lateral: 3.28 cm
Single Plane A2C EF: 56.3 %
Single Plane A4C EF: 69.1 %

## 2020-03-09 ENCOUNTER — Encounter: Payer: Self-pay | Admitting: Family Medicine

## 2020-03-09 ENCOUNTER — Other Ambulatory Visit: Payer: Self-pay

## 2020-03-09 ENCOUNTER — Ambulatory Visit (INDEPENDENT_AMBULATORY_CARE_PROVIDER_SITE_OTHER): Payer: Managed Care, Other (non HMO) | Admitting: Family Medicine

## 2020-03-09 VITALS — BP 122/80 | HR 86 | Temp 98.0°F | Ht 70.0 in | Wt 249.0 lb

## 2020-03-09 DIAGNOSIS — E1165 Type 2 diabetes mellitus with hyperglycemia: Secondary | ICD-10-CM | POA: Diagnosis not present

## 2020-03-09 DIAGNOSIS — Z23 Encounter for immunization: Secondary | ICD-10-CM

## 2020-03-09 NOTE — Progress Notes (Signed)
Subjective:   Chief Complaint  Patient presents with  . Follow-up    Maria Johnson is a 40 y.o. female here for follow-up of diabetes.   Secilia's self monitored glucose range is low 100's.  Patient denies hypoglycemic reactions. She checks her glucose levels 2 time(s) per day. Patient does not require insulin.   Medications include: Janvuia 100 mg/d, metformin XR 750 mg bid Diet is health overall.  Exercise: walking, weights  Past Medical History:  Diagnosis Date  . Acne    takes doxocycline daily for acne  . Anxiety   . Atopic dermatitis 04/04/2018  . Bilateral carpal tunnel syndrome 04/04/2018  . Bronchitis    history of  . Carpal tunnel syndrome on both sides   . Chicken pox   . Depression   . Diabetes mellitus   . Generalized anxiety disorder 01/08/2014  . Hirsutism 08/07/2019  . Hyperlipidemia   . Insomnia 08/24/2017  . Mixed hyperlipidemia 08/23/2019  . OSA on CPAP 09/25/2017  . 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 Pes planus 10/22/2015  . Sickle cell trait (HCC)   . Sleep apnea    has cpap  . Sprain of medial collateral ligament of left knee 10/30/2018  . Type 2 diabetes mellitus with hyperglycemia (HCC) 08/12/2013  . Vitamin D deficiency 08/24/2017  . Well adult exam 09/25/2017     Related testing: Retinal exam: Due Pneumovax: done  Objective:  BP 122/80 (BP Location: Left Arm, Patient Position: Sitting, Cuff Size: Large)   Pulse 86   Temp 98 F (36.7 C) (Oral)   Ht 5\' 10"  (1.778 m)   Wt 249 lb (112.9 kg)   LMP 08/09/2007   SpO2 97%   BMI 35.73 kg/m  General:  Well developed, well nourished, in no apparent distress Skin:  Warm, no pallor or diaphoresis Head:  Normocephalic, atraumatic Eyes:  Pupils equal and round, sclera anicteric without injection  Lungs:  CTAB, no access msc use Cardio:  RRR, no bruits, no LE edema Musculoskeletal:  Symmetrical muscle groups noted without atrophy or deformity Neuro:  Sensation intact to pinprick on  feet Psych: Age appropriate judgment and insight  Assessment:   Type 2 diabetes mellitus with hyperglycemia, without long-term current use of insulin (HCC) - Plan: Microalbumin / creatinine urine ratio, Hemoglobin A1c, Lipid panel, Comprehensive metabolic panel  Morbid Obesity (HCC) Refer [facility].   Plan:   Chronic, uncontrolled. Cont Metformin XR 750 mg bid and Januvia 100 mg/d. Cont to check sugars at home. Counseled on diet and exercise. F/u in 3-6 mo pending results. The patient voiced understanding and agreement to the plan.  10/10/2007 [place], DO 03/09/20 3:05 PM

## 2020-03-09 NOTE — Patient Instructions (Addendum)
Give [place] 2-3 business days to get the results of your labs back.   Keep the diet clean and stay active.  Try to get your eye appointment completed.   Call Center for [facility] at [phone] for an appointment.  They are located at [address], [place], [place], [address] (right across the hall from our office).  If you do not hear anything about your referral in the next 1-2 weeks, call our office and ask for an update.  Let us know if you need anything.

## 2020-03-10 ENCOUNTER — Other Ambulatory Visit: Payer: Self-pay | Admitting: Family Medicine

## 2020-03-10 LAB — COMPREHENSIVE METABOLIC PANEL
ALT: 34 U/L (ref 0–35)
AST: 25 U/L (ref 0–37)
Albumin: 4.3 g/dL (ref 3.5–5.2)
Alkaline Phosphatase: 55 U/L (ref 39–117)
BUN: 11 mg/dL (ref 6–23)
CO2: 29 mEq/L (ref 19–32)
Calcium: 9.5 mg/dL (ref 8.4–10.5)
Chloride: 105 mEq/L (ref 96–112)
Creatinine, Ser: 1.11 mg/dL (ref 0.40–1.20)
GFR: 62.47 mL/min (ref >60.00)
Glucose, Bld: 103 mg/dL — ABNORMAL HIGH (ref 70–99)
Potassium: 4.2 mEq/L (ref 3.5–5.1)
Sodium: 139 mEq/L (ref 135–145)
Total Bilirubin: 0.3 mg/dL (ref 0.2–1.2)
Total Protein: 7 g/dL (ref 6.0–8.3)

## 2020-03-10 LAB — HEMOGLOBIN A1C: Hgb A1c MFr Bld: 7.3 % — ABNORMAL HIGH (ref 4.6–6.5)

## 2020-03-10 LAB — MICROALBUMIN / CREATININE URINE RATIO
Creatinine,U: 122.5 mg/dL
Microalb Creat Ratio: 0.9 mg/g (ref 0.0–30.0)
Microalb, Ur: 1.1 mg/dL (ref 0.0–1.9)

## 2020-03-10 LAB — LIPID PANEL
Cholesterol: 173 mg/dL (ref 0–200)
HDL: 41.1 mg/dL (ref >39.00)
LDL Cholesterol: 106 mg/dL — ABNORMAL HIGH (ref 0–99)
NonHDL: 132.2
Total CHOL/HDL Ratio: 4
Triglycerides: 129 mg/dL (ref 0.0–149.0)
VLDL: 25.8 mg/dL (ref 0.0–40.0)

## 2020-03-10 MED ORDER — METFORMIN HCL ER 500 MG PO TB24: 1000.0000 mg | ORAL_TABLET | Freq: Two times a day (BID) | ORAL | 2 refills | Status: DC

## 2020-04-10 ENCOUNTER — Other Ambulatory Visit: Payer: Self-pay | Admitting: Family Medicine

## 2020-04-10 DIAGNOSIS — L680 Hirsutism: Secondary | ICD-10-CM

## 2020-04-15 ENCOUNTER — Encounter: Payer: Self-pay | Admitting: Family Medicine

## 2020-04-15 ENCOUNTER — Other Ambulatory Visit: Payer: Self-pay

## 2020-04-15 ENCOUNTER — Ambulatory Visit (INDEPENDENT_AMBULATORY_CARE_PROVIDER_SITE_OTHER): Payer: Managed Care, Other (non HMO) | Admitting: Family Medicine

## 2020-04-15 VITALS — BP 135/89 | HR 97 | Resp 16 | Ht 70.0 in | Wt 250.0 lb

## 2020-04-15 DIAGNOSIS — Z01419 Encounter for gynecological examination (general) (routine) without abnormal findings: Secondary | ICD-10-CM | POA: Diagnosis not present

## 2020-04-15 NOTE — Patient Instructions (Signed)
Health Maintenance, Female Adopting a healthy lifestyle and getting preventive care are important in promoting health and wellness. Ask your health care provider about:  The right schedule for you to have regular tests and exams.  Things you can do on your own to prevent diseases and keep yourself healthy. What should I know about diet, weight, and exercise? Eat a healthy diet  Eat a diet that includes plenty of vegetables, fruits, low-fat dairy products, and lean protein.  Do not eat a lot of foods that are high in solid fats, added sugars, or sodium.   Maintain a healthy weight Body mass index (BMI) is used to identify weight problems. It estimates body fat based on height and weight. Your health care provider can help determine your BMI and help you achieve or maintain a healthy weight. Get regular exercise Get regular exercise. This is one of the most important things you can do for your health. Most adults should:  Exercise for at least 150 minutes each week. The exercise should increase your heart rate and make you sweat (moderate-intensity exercise).  Do strengthening exercises at least twice a week. This is in addition to the moderate-intensity exercise.  Spend less time sitting. Even light physical activity can be beneficial. Watch cholesterol and blood lipids Have your blood tested for lipids and cholesterol at 40 years of age, then have this test every 5 years. Have your cholesterol levels checked more often if:  Your lipid or cholesterol levels are high.  You are older than 40 years of age.  You are at high risk for heart disease. What should I know about cancer screening? Depending on your health history and family history, you may need to have cancer screening at various ages. This may include screening for:  Breast cancer.  Cervical cancer.  Colorectal cancer.  Skin cancer.  Lung cancer. What should I know about heart disease, diabetes, and high blood  pressure? Blood pressure and heart disease  High blood pressure causes heart disease and increases the risk of stroke. This is more likely to develop in people who have high blood pressure readings, are of African descent, or are overweight.  Have your blood pressure checked: ? Every 3-5 years if you are 18-39 years of age. ? Every year if you are 40 years old or older. Diabetes Have regular diabetes screenings. This checks your fasting blood sugar level. Have the screening done:  Once every three years after age 40 if you are at a normal weight and have a low risk for diabetes.  More often and at a younger age if you are overweight or have a high risk for diabetes. What should I know about preventing infection? Hepatitis B If you have a higher risk for hepatitis B, you should be screened for this virus. Talk with your health care provider to find out if you are at risk for hepatitis B infection. Hepatitis C Testing is recommended for:  Everyone born from 1945 through 1965.  Anyone with known risk factors for hepatitis C. Sexually transmitted infections (STIs)  Get screened for STIs, including gonorrhea and chlamydia, if: ? You are sexually active and are younger than 40 years of age. ? You are older than 40 years of age and your health care provider tells you that you are at risk for this type of infection. ? Your sexual activity has changed since you were last screened, and you are at increased risk for chlamydia or gonorrhea. Ask your health care provider   if you are at risk.  Ask your health care provider about whether you are at high risk for HIV. Your health care provider may recommend a prescription medicine to help prevent HIV infection. If you choose to take medicine to prevent HIV, you should first get tested for HIV. You should then be tested every 3 months for as long as you are taking the medicine. Pregnancy  If you are about to stop having your period (premenopausal) and  you may become pregnant, seek counseling before you get pregnant.  Take 400 to 800 micrograms (mcg) of folic acid every day if you become pregnant.  Ask for birth control (contraception) if you want to prevent pregnancy. Osteoporosis and menopause Osteoporosis is a disease in which the bones lose minerals and strength with aging. This can result in bone fractures. If you are 65 years old or older, or if you are at risk for osteoporosis and fractures, ask your health care provider if you should:  Be screened for bone loss.  Take a calcium or vitamin D supplement to lower your risk of fractures.  Be given hormone replacement therapy (HRT) to treat symptoms of menopause. Follow these instructions at home: Lifestyle  Do not use any products that contain nicotine or tobacco, such as cigarettes, e-cigarettes, and chewing tobacco. If you need help quitting, ask your health care provider.  Do not use street drugs.  Do not share needles.  Ask your health care provider for help if you need support or information about quitting drugs. Alcohol use  Do not drink alcohol if: ? Your health care provider tells you not to drink. ? You are pregnant, may be pregnant, or are planning to become pregnant.  If you drink alcohol: ? Limit how much you use to 0-1 drink a day. ? Limit intake if you are breastfeeding.  Be aware of how much alcohol is in your drink. In the [place] one drink equals one 12 oz bottle of beer (355 mL), one 5 oz glass of wine (148 mL), or one 1 oz glass of hard liquor (44 mL). General instructions  Schedule regular health, dental, and eye exams.  Stay current with your vaccines.  Tell your health care provider if: ? You often feel depressed. ? You have ever been abused or do not feel safe at home. Summary  Adopting a healthy lifestyle and getting preventive care are important in promoting health and wellness.  Follow your health care provider's instructions about healthy  diet, exercising, and getting tested or screened for diseases.  Follow your health care provider's instructions on monitoring your cholesterol and blood pressure. This information is not intended to replace advice given to you by your health care provider. Make sure you discuss any questions you have with your health care provider. Document Revised: 01/10/2018 Document Reviewed: 01/10/2018 Elsevier Patient Education  2021 [employer]  

## 2020-04-15 NOTE — Progress Notes (Signed)
  Subjective:     Maria Johnson is a 40 y.o. female and is here for a comprehensive physical exam. The patient reports no problems. S/p TVH for bleeding in 2009. Has ovaries.  The following portions of the patient's history were reviewed and updated as appropriate: allergies, current medications, past family history, past medical history, past social history, past surgical history and problem list.  Review of Systems Pertinent items noted in HPI and remainder of comprehensive ROS otherwise negative.   Objective:    BP 135/89   Pulse 97   Resp 16   Ht 5\' 10"  (1.778 m)   Wt 250 lb (113.4 kg)   LMP 08/09/2007   BMI 35.87 kg/m  General appearance: alert, cooperative and appears stated age Head: Normocephalic, without obvious abnormality, atraumatic Neck: no adenopathy, supple, symmetrical, trachea midline and thyroid not enlarged, symmetric, no tenderness/mass/nodules Lungs: clear to auscultation bilaterally Breasts: normal appearance, no masses or tenderness Heart: regular rate and rhythm, S1, S2 normal, no murmur, click, rub or gallop Abdomen: soft, non-tender; bowel sounds normal; no masses,  no organomegaly Pelvic: external genitalia normal, uterus surgically absent and vagina normal without discharge Extremities: Homans sign is negative, no sign of DVT Pulses: 2+ and symmetric Skin: Skin color, texture, turgor normal. No rashes or lesions Lymph nodes: Cervical, supraclavicular, and axillary nodes normal. Neurologic: Grossly normal    Assessment:     GYN female exam.      Plan:  Encounter for gynecological examination without abnormal finding - Plan: MM 3D SCREEN BREAST BILATERAL  Return in 1 year (on 04/15/2021).    See After Visit Summary for Counseling Recommendations

## 2020-04-21 DIAGNOSIS — G5603 Carpal tunnel syndrome, bilateral upper limbs: Secondary | ICD-10-CM | POA: Insufficient documentation

## 2020-04-22 ENCOUNTER — Encounter: Payer: Self-pay | Admitting: Family Medicine

## 2020-04-22 ENCOUNTER — Other Ambulatory Visit: Payer: Self-pay

## 2020-04-22 ENCOUNTER — Ambulatory Visit (INDEPENDENT_AMBULATORY_CARE_PROVIDER_SITE_OTHER): Payer: Managed Care, Other (non HMO) | Admitting: Family Medicine

## 2020-04-22 VITALS — BP 128/84 | HR 98 | Temp 98.3°F | Ht 70.0 in | Wt 250.4 lb

## 2020-04-22 DIAGNOSIS — B353 Tinea pedis: Secondary | ICD-10-CM

## 2020-04-22 DIAGNOSIS — M79676 Pain in unspecified toe(s): Secondary | ICD-10-CM

## 2020-04-22 MED ORDER — KETOCONAZOLE 2 % EX CREA: 1.0000 "application " | TOPICAL_CREAM | Freq: Every day | CUTANEOUS | 0 refills | Status: DC

## 2020-04-22 NOTE — Patient Instructions (Signed)
If you do not hear anything about your referral in the next 1-2 weeks, call our office and ask for an update.  Let me know if anything changes.   Let us know if you need anything.

## 2020-04-22 NOTE — Progress Notes (Signed)
Chief Complaint  Patient presents with  . toes swelling    Subjective: Patient is a 40 y.o. female here for a strange sensation between her toes.  Around 10 days ago, the patient started wearing shoes and socks.  Between her second and third toe on the right, she felt a sensation that resembles her socks getting bunched up between them.  She notices no sock infiltration between her toes though.  There are no skin changes, pain, tingling, weakness, difficulty walking, or other areas of her feet affected.  She has not tried anything to help.  Past Medical History:  Diagnosis Date  . Acne    takes doxocycline daily for acne  . Anxiety   . Atopic dermatitis 04/04/2018  . Bilateral carpal tunnel syndrome 04/04/2018  . Bronchitis    history of  . Carpal tunnel syndrome on both sides   . Chicken pox   . Depression   . Diabetes mellitus   . Generalized anxiety disorder 01/08/2014  . Hirsutism 08/07/2019  . Hyperlipidemia   . Insomnia 08/24/2017  . Mixed hyperlipidemia 08/23/2019  . OSA on CPAP 09/25/2017  . 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 Pes planus 10/22/2015  . Sickle cell trait (HCC)   . Sleep apnea    has cpap  . Sprain of medial collateral ligament of left knee 10/30/2018  . Type 2 diabetes mellitus with hyperglycemia (HCC) 08/12/2013  . Vitamin D deficiency 08/24/2017  . Well adult exam 09/25/2017    Objective: BP 128/84 (BP Location: Left Arm, Patient Position: Sitting, Cuff Size: Normal)   Pulse 98   Temp 98.3 F (36.8 C) (Oral)   Ht 5\' 10"  (1.778 m)   Wt 250 lb 6 oz (113.6 kg)   LMP 08/09/2007   SpO2 98%   BMI 35.93 kg/m  General: Awake, appears stated age Skin: There is a macerated tissue between the third and fourth digit on the right foot, slightly dry skin between the second and third digit, there are no other skin lesions, redness, or fluctuance. MSK: There is no tenderness to palpation over the second and third digit.  There are no deformities.   There is no crepitus. Neuro: Gait is normal.  Sensation is intact to light touch. Heart: Brisk capillary refill, 2+ DP pulse on the right foot Lungs:  No accessory muscle use Psych: Age appropriate judgment and insight, normal affect and mood  Assessment and Plan: Complaint of pain of toe - Plan: Ambulatory referral to Podiatry  Tinea pedis of right foot - Plan: ketoconazole (NIZORAL) 2 % cream  1.  I do not have a great answer for this.  Does not seem to be excessively painful.  She does have a history of diabetes but neuropathy in this distribution would be uncommon.  She could have a superficial nerve sheath injury.  I would like to the podiatry team to help with evaluation. 2.  She does have athlete's foot but not directly in the area of concern.  I would like to see how she does on treatment. The patient voiced understanding and agreement to the plan.  10/10/2007 [place], DO 04/22/20  3:29 PM

## 2020-04-23 ENCOUNTER — Ambulatory Visit (INDEPENDENT_AMBULATORY_CARE_PROVIDER_SITE_OTHER): Payer: Managed Care, Other (non HMO) | Admitting: Cardiology

## 2020-04-23 ENCOUNTER — Encounter: Payer: Self-pay | Admitting: Cardiology

## 2020-04-23 VITALS — BP 110/80 | HR 82 | Ht 70.0 in | Wt 250.0 lb

## 2020-04-23 DIAGNOSIS — E1165 Type 2 diabetes mellitus with hyperglycemia: Secondary | ICD-10-CM

## 2020-04-23 DIAGNOSIS — I10 Essential (primary) hypertension: Secondary | ICD-10-CM | POA: Diagnosis not present

## 2020-04-23 DIAGNOSIS — E782 Mixed hyperlipidemia: Secondary | ICD-10-CM

## 2020-04-23 DIAGNOSIS — G4733 Obstructive sleep apnea (adult) (pediatric): Secondary | ICD-10-CM

## 2020-04-23 DIAGNOSIS — E669 Obesity, unspecified: Secondary | ICD-10-CM

## 2020-04-23 DIAGNOSIS — E559 Vitamin D deficiency, unspecified: Secondary | ICD-10-CM

## 2020-04-23 DIAGNOSIS — Z9989 Dependence on other enabling machines and devices: Secondary | ICD-10-CM

## 2020-04-23 NOTE — Progress Notes (Signed)
Cardiology Office Note:    Date:  04/23/2020   ID:  Maria Johnson, DOB 08/15/1980, MRN [id number]  PCP:  Sharlene DoryWendling, Nicholas Paul, DO  Cardiologist:  Thomasene RippleKardie Aydrian Halpin, DO  Electrophysiologist:  None   Referring MD: Sharlene DoryWendling, Nicholas Paul*   I am doing a lot better  History of Present Illness:    Maria Johnson is a 40 y.o. female with a hx of diabetes mellitus, gestational diabetes, polycystic ovarian syndrome, obstructive sleep apnea on CPAP and obesity here today for follow-up visit.  Did see the patient back in December 2021 at that time she experiencing intermittent chest pain.  I referred the patient for coronary CTA.  Given her shortness of breath an echocardiogram was ordered as well as a vitamin D study.  In the interim she was able to get her testing done.  All reported to be normal.  Patient was previously called with this information.  Past Medical History:  Diagnosis Date  . Acne    takes doxocycline daily for acne  . Anxiety   . Atopic dermatitis 04/04/2018  . Bilateral carpal tunnel syndrome 04/04/2018  . Bronchitis    history of  . Carpal tunnel syndrome on both sides   . Chicken pox   . Depression   . Diabetes mellitus   . Generalized anxiety disorder 01/08/2014  . Hirsutism 08/07/2019  . Hyperlipidemia   . Insomnia 08/24/2017  . Mixed hyperlipidemia 08/23/2019  . OSA on CPAP 09/25/2017  . 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 Pes planus 10/22/2015  . Sickle cell trait (HCC)   . Sleep apnea    has cpap  . Sprain of medial collateral ligament of left knee 10/30/2018  . Type 2 diabetes mellitus with hyperglycemia (HCC) 08/12/2013  . Vitamin D deficiency 08/24/2017  . Well adult exam 09/25/2017    Past Surgical History:  Procedure Laterality Date  . ANKLE SURGERY  05/2003   torn cartilage  repair; left  . ANTERIOR LUMBAR FUSION  01/06/2011   Procedure: ANTERIOR LUMBAR FUSION 1 LEVEL;  Surgeon: Dorian HeckleJoseph D Stern, MD;  Location: [facility];  Service:  Neurosurgery;  Laterality: N/A;  Lumbar five-Sacral one  Anterior Lumbar Interbody Fusion with Instrumentation   . BACK SURGERY     Laminectomy Sept 14, 2011,  . DIAGNOSTIC LAPAROSCOPY  01/2002   removed scar tissue  . LAMINECTOMY AND MICRODISCECTOMY LUMBAR SPINE  10/2009   L4, L5, S1  . LUMBAR FUSION  01/06/11   L5-S1  . TONSILLECTOMY  03/2005  . VAGINAL HYSTERECTOMY  08/2007   partial; AUB    Current Medications: Current Meds  Medication Sig  . ALPRAZolam (XANAX) 0.5 MG tablet TAKE 1 TABLET(0.5 MG) BY MOUTH DAILY AS NEEDED FOR ANXIETY  . atorvastatin (LIPITOR) 40 MG tablet Take 1 tablet (40 mg total) by mouth daily.  . fluticasone (FLONASE) 50 MCG/ACT nasal spray Place 2 sprays into both nostrils daily.  Marland Kitchen. JANUVIA 100 MG tablet TAKE 1 TABLET(100 MG) BY MOUTH DAILY  . ketoconazole (NIZORAL) 2 % cream Apply 1 application topically daily.  . metFORMIN (GLUCOPHAGE XR) 500 MG 24 hr tablet Take 2 tablets (1,000 mg total) by mouth in the morning and at bedtime.  Marland Kitchen. spironolactone (ALDACTONE) 50 MG tablet TAKE 1 TABLET(50 MG) BY MOUTH TWICE DAILY  . Vitamin D, Ergocalciferol, (DRISDOL) 1.25 MG (50000 UNIT) CAPS capsule Take 1 capsule (50,000 Units total) by mouth every 7 (seven) days.     Allergies:   Patient has  no known allergies.   Social History   Socioeconomic History  . Marital status: Married    Spouse name: Not on file  . Number of children: 3  . Years of education: Not on file  . Highest education level: Master's degree (e.g., MA, MS, MEng, MEd, MSW, MBA)  Occupational History  . Occupation: Scientific laboratory technician  Tobacco Use  . Smoking status: Never Smoker  . Smokeless tobacco: Never Used  Vaping Use  . Vaping Use: Never used  Substance and Sexual Activity  . Alcohol use: Yes    Comment: occassional  . Drug use: No  . Sexual activity: Yes    Birth control/protection: Surgical  Other Topics Concern  . Not on file  Social History Narrative  . Not on file   Social  Determinants of Health   Financial Resource Strain: Not on file  Food Insecurity: Not on file  Transportation Needs: Not on file  Physical Activity: Not on file  Stress: Not on file  Social Connections: Not on file     Family History: The patient's family history includes Alcohol abuse in her father; Birth defects in her son; Depression in her daughter and father; Diabetes in her daughter, maternal grandfather, maternal grandmother, and paternal grandmother; Drug abuse in her father; Heart attack in her maternal grandmother; Hyperlipidemia in her father; Hypertension in her daughter, maternal grandfather, maternal grandmother, and mother; Lupus in her paternal grandmother; Miscarriages / [place] in her daughter and mother; Stroke in her father and maternal grandmother.  ROS:   Review of Systems  Constitution: Negative for decreased appetite, fever and weight gain.  HENT: Negative for congestion, ear discharge, hoarse voice and sore throat.   Eyes: Negative for discharge, redness, vision loss in right eye and visual halos.  Cardiovascular: Negative for chest pain, dyspnea on exertion, leg swelling, orthopnea and palpitations.  Respiratory: Negative for cough, hemoptysis, shortness of breath and snoring.   Endocrine: Negative for heat intolerance and polyphagia.  Hematologic/Lymphatic: Negative for bleeding problem. Does not bruise/bleed easily.  Skin: Negative for flushing, nail changes, rash and suspicious lesions.  Musculoskeletal: Negative for arthritis, joint pain, muscle cramps, myalgias, neck pain and stiffness.  Gastrointestinal: Negative for abdominal pain, bowel incontinence, diarrhea and excessive appetite.  Genitourinary: Negative for decreased libido, genital sores and incomplete emptying.  Neurological: Negative for brief paralysis, focal weakness, headaches and loss of balance.  Psychiatric/Behavioral: Negative for altered mental status, depression and suicidal ideas.   Allergic/Immunologic: Negative for HIV exposure and persistent infections.    EKGs/Labs/Other Studies Reviewed:    The following studies were reviewed today:   EKG: None today  Coronary CTA aorta 01/19/2021 Aorta: Normal size.  No calcifications.  No dissection.  Aortic Valve:  Trileaflet.  No calcifications.  Coronary Arteries:  Normal coronary origin.  Right dominance.  RCA is a large dominant artery that gives rise to PDA and PLVB. There is no plaque.  Left main is a large artery that gives rise to LAD and LCX arteries.  LAD is a large vessel that has no plaque. 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  LCX is a non-dominant artery that gives rise to one large OM1 branch. There is no plaque.  Other findings:  Normal pulmonary vein drainage into the left atrium.  Normal left atrial appendage without a thrombus.  Normal size of the pulmonary artery.  IMPRESSION: 1. Coronary calcium score of 0. This was 0 percentile for age and sex matched control.  2. Normal coronary origin with right dominance.  3. No evidence of CAD.  4.  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  Thomasene Ripple, DO  Transthoracic echocardiogram February 27, 2020 IMPRESSIONS  1. Left ventricular ejection fraction, by estimation, is 60 to 65%. The  left ventricle has normal function. The left ventricle has no regional  wall motion abnormalities. Left ventricular diastolic parameters were  normal.  2. Right ventricular systolic function is normal. The right ventricular  size is normal.  3. The mitral valve is normal in structure. No evidence of mitral valve  regurgitation. No evidence of mitral stenosis.  4. The aortic valve is normal in structure. Aortic valve regurgitation is  not visualized. No aortic stenosis is present.  5. The inferior vena cava is normal in size with greater than 50%  respiratory variability, suggesting right atrial pressure of  3 mmHg.   FINDINGS  Left Ventricle: Left ventricular ejection fraction, by estimation, is 60  to 65%. The left ventricle has normal function. The left ventricle has no  regional wall motion abnormalities. The left ventricular internal cavity  size was normal in size. There is  no left ventricular hypertrophy. Left ventricular diastolic parameters  were normal.   Right Ventricle: The right ventricular size is normal. No increase in  right ventricular wall thickness. Right ventricular systolic function is  normal.   Left Atrium: Left atrial size was normal in size.   Right Atrium: Right atrial size was normal in size.   Pericardium: There is no evidence of pericardial effusion.   Mitral Valve: The mitral valve is normal in structure. No evidence of  mitral valve regurgitation. No evidence of mitral valve stenosis.   Tricuspid Valve: The tricuspid valve is normal in structure. Tricuspid  valve regurgitation is not demonstrated. No evidence of tricuspid  stenosis.   Aortic Valve: The aortic valve is normal in structure. Aortic valve  regurgitation is not visualized. No aortic stenosis is present. Aortic  valve mean gradient measures 4.0 mmHg. Aortic valve peak gradient measures  10.1 mmHg. Aortic valve area, by VTI  measures 2.13 cm.   Pulmonic Valve: The pulmonic valve was normal in structure. Pulmonic valve  regurgitation is not visualized. No evidence of pulmonic stenosis.   Aorta: The aortic root is normal in size and structure.   Venous: The inferior vena cava is normal in size with greater than 50%  respiratory variability, suggesting right atrial pressure of 3 mmHg.   IAS/Shunts: No atrial level shunt detected by color flow Doppler.      Recent Labs: 03/09/2020: ALT 34; BUN 11; Creatinine, Ser 1.11; Potassium 4.2; Sodium 139  Recent Lipid Panel    Component Value Date/Time   CHOL 173 03/09/2020 1513   TRIG 129.0 03/09/2020 1513   HDL 41.10 03/09/2020 1513    CHOLHDL 4 03/09/2020 1513   VLDL 25.8 03/09/2020 1513   LDLCALC 106 (H) 03/09/2020 1513    Physical Exam:    VS:  BP 110/80   Pulse 82   Ht 5\' 10"  (1.778 m)   Wt 250 lb 0.6 oz (113.4 kg)   LMP 08/09/2007   SpO2 98%   BMI 35.88 kg/m     Wt Readings from Last 3 Encounters:  04/23/20 250 lb 0.6 oz (113.4 kg)  04/22/20 250 lb 6 oz (113.6 kg)  04/15/20 250 lb (113.4 kg)     GEN: Well nourished, well developed in no acute distress HEENT: Normal NECK: No JVD; No carotid  bruits LYMPHATICS: No lymphadenopathy CARDIAC: S1S2 noted,RRR, no murmurs, rubs, gallops RESPIRATORY:  Clear to auscultation without rales, wheezing or rhonchi  ABDOMEN: Soft, non-tender, non-distended, +bowel sounds, no guarding. EXTREMITIES: No edema, No cyanosis, no clubbing MUSCULOSKELETAL:  No deformity  SKIN: Warm and dry NEUROLOGIC:  Alert and oriented x 3, non-focal PSYCHIATRIC:  Normal affect, good insight  ASSESSMENT:    1. Hypertension, unspecified type   2. Obesity, unspecified classification, unspecified obesity type, unspecified whether serious comorbidity present   3. OSA on CPAP   4. Mixed hyperlipidemia   5. Type 2 diabetes mellitus with hyperglycemia, without long-term current use of insulin (HCC)    PLAN:    Her vitamin D level was very low after her first appointment we repleted the patient with vitamin D 2000 units 12 weeks.  Her blood pressure susceptible.  Review her recent lab her hemoglobin A1c was 7.3.  She is working on this as well.  Continue patient on her CPAP at night.  This is being managed by his primary care doctor.  No adjustments for antidiabetic medications were made today.  Hyperlipidemia - continue with current statin medication.  The patient understands the need to lose weight with diet and exercise. We have discussed specific strategies for this.  The patient is in agreement with the above plan. The patient left the office in stable condition.  The patient will  follow up as needed.   Medication Adjustments/Labs and Tests Ordered: Current medicines are reviewed at length with the patient today.  Concerns regarding medicines are outlined above.  No orders of the defined types were placed in this encounter.  No orders of the defined types were placed in this encounter.   Patient Instructions  Medication Instructions:  Your physician recommends that you continue on your current medications as directed. Please refer to the Current Medication list given to you today.  *If you need a refill on your cardiac medications before your next appointment, please call your pharmacy*   Lab Work: None If you have labs (blood work) drawn today and your tests are completely normal, you will receive your results only by: Marland Kitchen MyChart Message (if you have MyChart) OR . A paper copy in the mail If you have any lab test that is abnormal or we need to change your treatment, we will call you to review the results.   Testing/Procedures: None   Follow-Up: At [facility], you and your health needs are our priority.  As part of our continuing mission to provide you with exceptional heart care, we have created designated Provider Care Teams.  These Care Teams include your primary Cardiologist (physician) and Advanced Practice Providers (APPs -  Physician Assistants and Nurse Practitioners) who all work together to provide you with the care you need, when you need it.  We recommend signing up for the patient portal called "MyChart".  Sign up information is provided on this After Visit Summary.  MyChart is used to connect with patients for Virtual Visits (Telemedicine).  Patients are able to view lab/test results, encounter notes, upcoming appointments, etc.  Non-urgent messages can be sent to your provider as well.   To learn more about what you can do with MyChart, go to [email or web address].    Your next appointment:   As needed  The format for your next  appointment:   In Person  Provider:   Thomasene Ripple, DO   Other Instructions      Adopting a Healthy Lifestyle.  Know what  a healthy weight is for you (roughly BMI <25) and aim to maintain this   Aim for 7+ servings of fruits and vegetables daily   65-80+ fluid ounces of water or unsweet tea for healthy kidneys   Limit to max 1 drink of alcohol per day; avoid smoking/tobacco   Limit animal fats in diet for cholesterol and heart health - choose grass fed whenever available   Avoid highly processed foods, and foods high in saturated/trans fats   Aim for low stress - take time to unwind and care for your mental health   Aim for 150 min of moderate intensity exercise weekly for heart health, and weights twice weekly for bone health   Aim for 7-9 hours of sleep daily   When it comes to diets, agreement about the perfect plan isnt easy to find, even among the experts. Experts at the [facility] [employer] developed an idea known as the Healthy Eating Plate. Just imagine a plate divided into logical, healthy portions.   The emphasis is on diet quality:   Load up on vegetables and fruits - one-half of your plate: Aim for color and variety, and remember that potatoes dont count.   Go for whole grains - one-quarter of your plate: Whole wheat, barley, wheat berries, quinoa, oats, brown rice, and foods made with them. If you want pasta, go with whole wheat pasta.   Protein power - one-quarter of your plate: Fish, chicken, beans, and nuts are all healthy, versatile protein sources. Limit red meat.   The diet, however, does go beyond the plate, offering a few other suggestions.   Use healthy plant oils, such as olive, canola, soy, corn, sunflower and peanut. Check the labels, and avoid partially hydrogenated oil, which have unhealthy trans fats.   If youre thirsty, drink water. Coffee and tea are good in moderation, but skip sugary drinks and limit milk and dairy products  to one or two daily servings.   The type of carbohydrate in the diet is more important than the amount. Some sources of carbohydrates, such as vegetables, fruits, whole grains, and beans-are healthier than others.   Finally, stay active  Signed, Thomasene Ripple, DO  04/23/2020 5:07 PM    [facility]

## 2020-04-23 NOTE — Patient Instructions (Signed)

## 2020-04-29 ENCOUNTER — Other Ambulatory Visit: Payer: Self-pay | Admitting: Cardiology

## 2020-04-29 NOTE — Telephone Encounter (Signed)
Refill sent to pharmacy.   

## 2020-04-30 ENCOUNTER — Ambulatory Visit (INDEPENDENT_AMBULATORY_CARE_PROVIDER_SITE_OTHER): Payer: Managed Care, Other (non HMO)

## 2020-04-30 ENCOUNTER — Ambulatory Visit (INDEPENDENT_AMBULATORY_CARE_PROVIDER_SITE_OTHER): Payer: Managed Care, Other (non HMO) | Admitting: Podiatry

## 2020-04-30 ENCOUNTER — Encounter: Payer: Self-pay | Admitting: Podiatry

## 2020-04-30 ENCOUNTER — Other Ambulatory Visit: Payer: Self-pay

## 2020-04-30 DIAGNOSIS — M778 Other enthesopathies, not elsewhere classified: Secondary | ICD-10-CM | POA: Diagnosis not present

## 2020-04-30 DIAGNOSIS — M79671 Pain in right foot: Secondary | ICD-10-CM

## 2020-04-30 MED ORDER — TRIAMCINOLONE ACETONIDE 10 MG/ML IJ SUSP
10.0000 mg | Freq: Once | INTRAMUSCULAR | Status: AC
  Administered 2020-04-30: 10 mg

## 2020-04-30 NOTE — Progress Notes (Signed)
Subjective:   Patient ID: Maria Johnson, female   DOB: 40 y.o.   MRN: [id number]   HPI Patient presents stating she is having a lot of pain in her right forefoot and has been present for a while gotten worse recently.  She is trying to be active and exercise every day and states that its been sore in that area and feels full.  Patient does not smoke likes to be active and has moderate obesity that she is trying to work on   Review of Systems  All other systems reviewed and are negative.       Objective:  Physical Exam Vitals and nursing note reviewed.  Constitutional:      Appearance: She is well-developed.  Pulmonary:     Effort: Pulmonary effort is normal.  Musculoskeletal:        General: Normal range of motion.  Skin:    General: Skin is warm.  Neurological:     Mental Status: She is alert.     Neurovascular status intact muscle strength adequate range of motion adequate with patient found to have exquisite discomfort second MPJ right inflammation fluid around the joint surface and is noted to have mild elongation second digit with no dorsal excursion of the toe.  Good digital perfusion well oriented x3      Assessment:  Acute capsulitis second MPJ right with fluid buildup     Plan:  H&P reviewed condition went ahead did a sterile anesthetic block of the area sterile prep done aspirated the joint getting a small amount of clear fluid injected quarter cc dexamethasone Kenalog applied thick plantar padding to reduce pressure on the joint explained rigid bottom shoes reappoint if symptoms persist  X-rays indicated slight elongation second metatarsal no indications of arthritis stress fracture

## 2020-05-10 ENCOUNTER — Other Ambulatory Visit: Payer: Self-pay | Admitting: Family Medicine

## 2020-05-13 ENCOUNTER — Other Ambulatory Visit: Payer: Self-pay

## 2020-05-13 ENCOUNTER — Ambulatory Visit (HOSPITAL_BASED_OUTPATIENT_CLINIC_OR_DEPARTMENT_OTHER)
Admission: RE | Admit: 2020-05-13 | Discharge: 2020-05-13 | Disposition: A | Discharge status: Home / Self Care | Payer: Managed Care, Other (non HMO) | Source: Ambulatory Visit | Attending: Family Medicine | Admitting: Family Medicine

## 2020-05-13 DIAGNOSIS — Z1231 Encounter for screening mammogram for malignant neoplasm of breast: Secondary | ICD-10-CM | POA: Insufficient documentation

## 2020-05-13 DIAGNOSIS — Z01419 Encounter for gynecological examination (general) (routine) without abnormal findings: Secondary | ICD-10-CM

## 2020-05-27 ENCOUNTER — Encounter: Payer: Self-pay | Admitting: Podiatry

## 2020-06-03 ENCOUNTER — Encounter (INDEPENDENT_AMBULATORY_CARE_PROVIDER_SITE_OTHER): Payer: Self-pay | Admitting: Family Medicine

## 2020-06-03 ENCOUNTER — Ambulatory Visit (INDEPENDENT_AMBULATORY_CARE_PROVIDER_SITE_OTHER): Payer: Managed Care, Other (non HMO) | Admitting: Family Medicine

## 2020-06-03 ENCOUNTER — Other Ambulatory Visit: Payer: Self-pay

## 2020-06-03 VITALS — BP 108/79 | HR 81 | Temp 97.7°F | Ht 68.0 in | Wt 247.0 lb

## 2020-06-03 DIAGNOSIS — E559 Vitamin D deficiency, unspecified: Secondary | ICD-10-CM

## 2020-06-03 DIAGNOSIS — Z6837 Body mass index (BMI) 37.0-37.9, adult: Secondary | ICD-10-CM

## 2020-06-03 DIAGNOSIS — R0602 Shortness of breath: Secondary | ICD-10-CM | POA: Diagnosis not present

## 2020-06-03 DIAGNOSIS — R5383 Other fatigue: Secondary | ICD-10-CM | POA: Diagnosis not present

## 2020-06-03 DIAGNOSIS — E1169 Type 2 diabetes mellitus with other specified complication: Secondary | ICD-10-CM | POA: Diagnosis not present

## 2020-06-03 DIAGNOSIS — Z1331 Encounter for screening for depression: Secondary | ICD-10-CM

## 2020-06-03 DIAGNOSIS — Z9189 Other specified personal risk factors, not elsewhere classified: Secondary | ICD-10-CM

## 2020-06-03 DIAGNOSIS — E785 Hyperlipidemia, unspecified: Secondary | ICD-10-CM

## 2020-06-03 DIAGNOSIS — Z0289 Encounter for other administrative examinations: Secondary | ICD-10-CM

## 2020-06-03 DIAGNOSIS — F411 Generalized anxiety disorder: Secondary | ICD-10-CM | POA: Insufficient documentation

## 2020-06-03 DIAGNOSIS — E119 Type 2 diabetes mellitus without complications: Secondary | ICD-10-CM | POA: Insufficient documentation

## 2020-06-04 ENCOUNTER — Ambulatory Visit (INDEPENDENT_AMBULATORY_CARE_PROVIDER_SITE_OTHER): Payer: Managed Care, Other (non HMO) | Admitting: Podiatry

## 2020-06-04 ENCOUNTER — Encounter: Payer: Self-pay | Admitting: Podiatry

## 2020-06-04 ENCOUNTER — Ambulatory Visit (INDEPENDENT_AMBULATORY_CARE_PROVIDER_SITE_OTHER): Payer: Managed Care, Other (non HMO)

## 2020-06-04 DIAGNOSIS — M778 Other enthesopathies, not elsewhere classified: Secondary | ICD-10-CM

## 2020-06-04 LAB — COMPREHENSIVE METABOLIC PANEL
ALT: 31 IU/L (ref 0–32)
AST: 24 IU/L (ref 0–40)
Albumin/Globulin Ratio: 1.8 (ref 1.2–2.2)
Albumin: 4.5 g/dL (ref 3.8–4.8)
Alkaline Phosphatase: 72 IU/L (ref 44–121)
BUN/Creatinine Ratio: 9 (ref 9–23)
BUN: 9 mg/dL (ref 6–24)
Bilirubin Total: <0.2 mg/dL (ref 0.0–1.2)
CO2: 20 mmol/L (ref 20–29)
Calcium: 9.7 mg/dL (ref 8.7–10.2)
Chloride: 100 mmol/L (ref 96–106)
Creatinine, Ser: 1.01 mg/dL — ABNORMAL HIGH (ref 0.57–1.00)
Globulin, Total: 2.5 g/dL (ref 1.5–4.5)
Glucose: 87 mg/dL (ref 65–99)
Potassium: 4.8 mmol/L (ref 3.5–5.2)
Sodium: 137 mmol/L (ref 134–144)
Total Protein: 7 g/dL (ref 6.0–8.5)
eGFR: 72 mL/min/{1.73_m2} (ref >59)

## 2020-06-04 LAB — VITAMIN B12: Vitamin B-12: 379 pg/mL (ref 232–1245)

## 2020-06-04 LAB — INSULIN, RANDOM: INSULIN: 33.8 u[IU]/mL — ABNORMAL HIGH (ref 2.6–24.9)

## 2020-06-04 LAB — CBC WITH DIFFERENTIAL/PLATELET
Basophils Absolute: 0.1 10*3/uL (ref 0.0–0.2)
Basos: 1 %
EOS (ABSOLUTE): 0.2 10*3/uL (ref 0.0–0.4)
Eos: 3 %
Hematocrit: 39.8 % (ref 34.0–46.6)
Hemoglobin: 12.6 g/dL (ref 11.1–15.9)
Immature Grans (Abs): 0 10*3/uL (ref 0.0–0.1)
Immature Granulocytes: 0 %
Lymphocytes Absolute: 2.6 10*3/uL (ref 0.7–3.1)
Lymphs: 39 %
MCH: 26.1 pg — ABNORMAL LOW (ref 26.6–33.0)
MCHC: 31.7 g/dL (ref 31.5–35.7)
MCV: 83 fL (ref 79–97)
Monocytes Absolute: 0.3 10*3/uL (ref 0.1–0.9)
Monocytes: 5 %
Neutrophils Absolute: 3.5 10*3/uL (ref 1.4–7.0)
Neutrophils: 52 %
Platelets: 416 10*3/uL (ref 150–450)
RBC: 4.82 x10E6/uL (ref 3.77–5.28)
RDW: 13.5 % (ref 11.7–15.4)
WBC: 6.7 10*3/uL (ref 3.4–10.8)

## 2020-06-04 LAB — LIPID PANEL
Chol/HDL Ratio: 3.3 ratio (ref 0.0–4.4)
Cholesterol, Total: 136 mg/dL (ref 100–199)
HDL: 41 mg/dL (ref >39)
LDL Chol Calc (NIH): 75 mg/dL (ref 0–99)
Triglycerides: 107 mg/dL (ref 0–149)
VLDL Cholesterol Cal: 20 mg/dL (ref 5–40)

## 2020-06-04 LAB — HEMOGLOBIN A1C
Est. average glucose Bld gHb Est-mCnc: 154 mg/dL
Hgb A1c MFr Bld: 7 % — ABNORMAL HIGH (ref 4.8–5.6)

## 2020-06-04 LAB — T4, FREE: Free T4: 1.14 ng/dL (ref 0.82–1.77)

## 2020-06-04 LAB — FOLATE: Folate: 9.3 ng/mL (ref >3.0)

## 2020-06-04 LAB — VITAMIN D 25 HYDROXY (VIT D DEFICIENCY, FRACTURES): Vit D, 25-Hydroxy: 24.8 ng/mL — ABNORMAL LOW (ref 30.0–100.0)

## 2020-06-04 LAB — TSH: TSH: 1.18 u[IU]/mL (ref 0.450–4.500)

## 2020-06-04 MED ORDER — DICLOFENAC SODIUM 75 MG PO TBEC: 75.0000 mg | DELAYED_RELEASE_TABLET | Freq: Two times a day (BID) | ORAL | 2 refills | Status: DC

## 2020-06-08 NOTE — Progress Notes (Signed)
Subjective:   Patient ID: Maria Johnson, female   DOB: 40 y.o.   MRN: [id number]   HPI Patient states the pain has increased in her right foot and she feels like she is walking on a broken bone   ROS      Objective:  Physical Exam  Neuro vascular status intact with inflammation pain plantar aspect right foot painful when pressed with fluid buildup occurring around the second metatarsal phalangeal joints with significant inflammation of the area     Assessment:  Probability for acute capsulitis cannot rule out stress fracture right and patient does have possibility for flexor plate trauma damage to the right second MPJ     Plan:  H&P reviewed condition and recommended complete immobilization to allow complete rest with the possibility at 1 point for shortening osteotomy or other treatments.  I went ahead today and I applied air fracture walker with all instructions on usage explained on ice therapy anti-inflammatories and patient will be seen back 4 weeks may gradually return to a rigid bottom shoe at approximate 3 weeks if symptoms are improved  X-rays are negative for signs of fracture associated with this appears to be acute inflammation condition

## 2020-06-09 ENCOUNTER — Encounter: Payer: Managed Care, Other (non HMO) | Admitting: Family Medicine

## 2020-06-09 ENCOUNTER — Other Ambulatory Visit: Payer: Self-pay | Admitting: Family Medicine

## 2020-06-10 NOTE — Progress Notes (Signed)
Dear Dr. Carmelia Roller,   Thank you for referring Maria Johnson to our clinic. The following note includes my evaluation and treatment recommendations.  Chief Complaint:   OBESITY Gabriana EVALIE HARGRAVES (MR# [id number]) is a 40 y.o. female who presents for evaluation and treatment of obesity and related comorbidities. Current BMI is Body mass index is 37.56 kg/m. Zanetta has been struggling with her weight for many years and has been unsuccessful in either losing weight, maintaining weight loss, or reaching her healthy weight goal.  Zeeva is currently in the action stage of change and ready to dedicate time achieving and maintaining a healthier weight. Neli is interested in becoming our patient and working on intensive lifestyle modifications including (but not limited to) diet and exercise for weight loss.  Raelynn works full time.  She is married to her husband, Jerilynn Som, and has a 18 year old son, Woodward Ku.  She also has a 79 year old son and a 38 year old daughter.  BHCG/"Lipotropics" and Nutrisystem worked in the past, but she did not keep it off.  Craves sweet and salty foods.  Does not like some cheese or [place] yogurt and thinks she is a picky eater.  Snacks on yogurt.    Kymberly's habits were reviewed today and are as follows: Her family eats meals together, she thinks her family will eat healthier with her, her desired weight loss is 60 pounds, she started gaining weight after age 37, her heaviest weight ever was her current weight, she is a picky eater and doesn't like to eat healthier foods, she craves sweet or salty foods, she skips lunch frequently, she is frequently drinking liquids with calories and she struggles with emotional eating.  Depression Screen Glenna's Food and Mood (modified PHQ-9) score was 11.  Depression screen PHQ 2/9 06/03/2020  Decreased Interest 0  Down, Depressed, Hopeless 3  PHQ - 2 Score 3  Altered sleeping 2  Tired, decreased energy 2  Change in  appetite 1  Feeling bad or failure about yourself  3  Trouble concentrating 0  Moving slowly or fidgety/restless 0  Suicidal thoughts 0  PHQ-9 Score 11  Difficult doing work/chores Not difficult at all   Assessment/Plan:   Orders Placed This Encounter  Procedures  . Vitamin B12  . CBC with Differential/Platelet  . Comprehensive metabolic panel  . Folate  . Hemoglobin A1c  . Insulin, random  . Lipid panel  . T4, free  . TSH  . VITAMIN D 25 Hydroxy (Vit-D Deficiency, Fractures)    Medications Discontinued During This Encounter  Medication Reason  . ketoconazole (NIZORAL) 2 % cream     1. Other fatigue Omolara denies daytime somnolence and reports waking up still tired. Patent has a history of symptoms of morning fatigue and snoring. Artina generally gets 6-8 hours of sleep per night, and states that she has generally restful sleep. Snoring is present. Apneic episodes are not present. Epworth Sleepiness Score is 3.  Tisa does feel that her weight is causing her energy to be lower than it should be. Fatigue may be related to obesity, depression or many other causes. Labs will be ordered, and in the meanwhile, Kaidyn will focus on self care including making healthy food choices, increasing physical activity and focusing on stress reduction.  Will check labs today.  Did not do ECG today.  - Vitamin B12 - CBC with Differential/Platelet - Comprehensive metabolic panel - Folate - T4, free - TSH  2. SOBOE (shortness of breath on exertion) Keyshla notes  increasing shortness of breath with exercising and seems to be worsening over time with weight gain. She notes getting out of breath sooner with activity than she used to. This has gotten worse recently. Winry denies shortness of breath at rest or orthopnea.  Jackquelyn does feel that she gets out of breath more easily that she used to when she exercises. Caera's shortness of breath appears to be obesity related and  exercise induced. She has agreed to work on weight loss and gradually increase exercise to treat her exercise induced shortness of breath. Will continue to monitor closely.  Check IC and labs today.  - Vitamin B12 - CBC with Differential/Platelet - Comprehensive metabolic panel - Folate - T4, free - TSH  3. Type 2 diabetes mellitus with other specified complication, without long-term current use of insulin (HCC) Diabetes Mellitus: Not at goal. Medication: Januvia 100 mg daily, metformin 750 mg twice daily. Issues reviewed: blood sugar goals, complications of diabetes mellitus, hypoglycemia prevention and treatment, exercise, and nutrition.  FBS 93-107 over the past week.  Controlled.  Asymptomatic.  First diagnosed in 2007.  Started as gestational.  A1c 7.3 ~3 months ago.  Plan: The importance of regular follow up with PCP and all other specialists as scheduled was stressed to patient today. The patient will continue to focus on protein-rich, low simple carbohydrate foods. We reviewed the importance of hydration, regular exercise for stress reduction, and restorative sleep.  Will check CMP, A1c, and insulin level today, as per below.  Lab Results  Component Value Date   HGBA1C 7.3 (H) 03/09/2020   HGBA1C 7.2 (H) 11/08/2019   Lab Results  Component Value Date   MICROALBUR 1.1 03/09/2020   - Comprehensive metabolic panel - Hemoglobin A1c - Insulin, random  4. Hyperlipidemia associated with type 2 diabetes mellitus (HCC) Course: Controlled. Lipid-lowering medications: Lipitor 40 mg daily.  Recent labs at PCP's office on 03/09/2020.  Plan: Dietary changes: Increase soluble fiber, decrease simple carbohydrates, decrease saturated fat. Exercise changes: Moderate to vigorous-intensity aerobic activity 150 minutes per week or as tolerated. We will continue to monitor along with PCP/specialists as it pertains to her weight loss journey.  Will check FLP today.  The 10-year ASCVD risk score Denman George  DC Montez Hageman., et al., 2013) is: 1.9%   Values used to calculate the score:     Age: 72 years     Sex: Female     Is Non-Hispanic African American: Yes     Diabetic: Yes     Tobacco smoker: No     Systolic Blood Pressure: 108 mmHg     Is BP treated: Yes     HDL Cholesterol: 41 mg/dL     Total Cholesterol: 136 mg/dL  - Lipid panel  5. Vitamin D deficiency Not at goal. Current vitamin D is 8.9, tested on 02/04/2020. Optimal goal > 50 ng/dL.  She is taking vitamin D 50,000 IU weekly.  Started treatment in January.  Never rechecked.  Plan: Continue to take prescription Vitamin D @50 ,000 IU every week as prescribed.  Will check vitamin D level today, as per below.  - VITAMIN D 25 Hydroxy (Vit-D Deficiency, Fractures)  6. Depression screening Scherry was screened for depression as part of her new patient workup.  PHQ-9 is 11.  Ashlei had a positive depression screening. Depression is commonly associated with obesity and often results in emotional eating behaviors. We will monitor this closely and work on CBT to help improve the non-hunger eating patterns. Referral to  Psychology may be required if no improvement is seen as she continues in our clinic.  7. GAD (generalized anxiety disorder), with emotional eating Not at goal. Medication: Xanax 0.5 mg daily as needed for anxiety.  Denies down or depressed mood.  Plan:  Declines Dr. Dewaine Conger.  GAD controlled currently.  No concerns or issues.  Behavior modification techniques were discussed today to help deal with emotional/non-hunger eating behaviors.  8. At risk for heart disease Due to Marco's current state of health and medical condition(s), she is at a higher risk for heart disease.  This puts the patient at much greater risk to subsequently develop cardiopulmonary conditions that can significantly affect patient's quality of life in a negative manner.    At least 22 minutes were spent on counseling Paislyn about these concerns today, and I  stressed the importance of reversing risks factors of obesity, especially truncal and visceral fat, hypertension, hyperlipidemia, and pre-diabetes.  The initial goal is to lose at least 5-10% of starting weight to help reduce these risk factors.  Counseling:  Intensive lifestyle modifications were discussed with Chiyo as the most appropriate first line of treatment.  she will continue to work on diet, exercise, and weight loss efforts.  We will continue to reassess these conditions on a fairly regular basis in an attempt to decrease the patient's overall morbidity and mortality.  Evidence-based interventions for health behavior change were utilized today including the discussion of self monitoring techniques, problem-solving barriers, and SMART goal setting techniques.  Specifically, regarding patient's less desirable eating habits and patterns, we employed the technique of small changes when Jolie has not been able to fully commit to her prudent nutritional plan.  9. Class 2 severe obesity with serious comorbidity and body mass index (BMI) of 37.0 to 37.9 in adult, unspecified obesity type (HCC)  Nelwyn is currently in the action stage of change and her goal is to continue with weight loss efforts. I recommend Shakaya begin the structured treatment plan as follows:  She has agreed to the Category 3 Plan.  Exercise goals: As is.   Behavioral modification strategies: increasing lean protein intake, decreasing liquid calories, no skipping meals, meal planning and cooking strategies and planning for success.  She was informed of the importance of frequent follow-up visits to maximize her success with intensive lifestyle modifications for her multiple health conditions. She was informed we would discuss her lab results at her next visit unless there is a critical issue that needs to be addressed sooner. Chrystine agreed to keep her next visit at the agreed upon time to discuss these  results.  Objective:   Blood pressure 108/79, pulse 81, temperature 97.7 F (36.5 C), height 5\' 8"  (1.727 m), weight 247 lb (112 kg), last menstrual period 08/09/2007, SpO2 98 %. Body mass index is 37.56 kg/m.  Indirect Calorimeter completed today shows a VO2 of 341 and a REE of 2371.  Her calculated basal metabolic rate is 10/10/2007 thus her basal metabolic rate is better than expected.  General: Cooperative, alert, well developed, in no acute distress. HEENT: Conjunctivae and lids unremarkable. Cardiovascular: Regular rhythm.  Lungs: Normal work of breathing. Neurologic: No focal deficits.   Lab Results  Component Value Date   CREATININE 1.01 (H) 06/03/2020   BUN 9 06/03/2020   NA 137 06/03/2020   K 4.8 06/03/2020   CL 100 06/03/2020   CO2 20 06/03/2020   Lab Results  Component Value Date   ALT 31 06/03/2020   AST 24  06/03/2020   ALKPHOS 72 06/03/2020   BILITOT <0.2 06/03/2020   Lab Results  Component Value Date   HGBA1C 7.0 (H) 06/03/2020   HGBA1C 7.3 (H) 03/09/2020   HGBA1C 7.2 (H) 11/08/2019   HGBA1C 7.3 (H) 08/07/2019   HGBA1C 6.4 01/18/2019   Lab Results  Component Value Date   INSULIN 33.8 (H) 06/03/2020   Lab Results  Component Value Date   TSH 1.180 06/03/2020   Lab Results  Component Value Date   CHOL 136 06/03/2020   HDL 41 06/03/2020   LDLCALC 75 06/03/2020   TRIG 107 06/03/2020   CHOLHDL 3.3 06/03/2020   Lab Results  Component Value Date   WBC 6.7 06/03/2020   HGB 12.6 06/03/2020   HCT 39.8 06/03/2020   MCV 83 06/03/2020   PLT 416 06/03/2020   Attestation Statements:   This is the patient's first visit at [facility]. The patient's NEW PATIENT PACKET was reviewed at length. Included in the packet: current and past health history, medications, allergies, ROS, gynecologic history (women only), surgical history, family history, social history, weight history, weight loss surgery history (for those that have had weight loss  surgery), nutritional evaluation, mood and food questionnaire, PHQ9, Epworth questionnaire, sleep habits questionnaire, patient life and health improvement goals questionnaire. These will all be scanned into the patient's chart under media.   During the visit, I independently reviewed the patient's EKG, bioimpedance scale results, and indirect calorimeter results. I used this information to tailor a meal plan for the patient that will help her to lose weight and will improve her obesity-related conditions going forward. I performed a medically necessary appropriate examination and/or evaluation. I discussed the assessment and treatment plan with the patient. The patient was provided an opportunity to ask questions and all were answered. The patient agreed with the plan and demonstrated an understanding of the instructions. Labs were ordered at this visit and will be reviewed at the next visit unless more critical results need to be addressed immediately. Clinical information was updated and documented in the EMR.   I, Insurance claims handlerAmber Agner, CMA, am acting as Energy managertranscriptionist for [employer] & McLennanDeborah Omkar Stratmann, DO.  I have reviewed the above documentation for accuracy and completeness, and I agree with the above. Carlye Grippe- Shaqueta Casady J Valena Ivanov, D.O.  The 21st Century Cures Act was signed into law in 2016 which includes the topic of electronic health records.  This provides immediate access to information in MyChart.  This includes consultation notes, operative notes, office notes, lab results and pathology reports.  If you have any questions about what you read please let us know at your next visit so we can discuss your concerns and take corrective action if need be.  We are right here with you.

## 2020-06-17 ENCOUNTER — Other Ambulatory Visit: Payer: Self-pay

## 2020-06-17 ENCOUNTER — Encounter (INDEPENDENT_AMBULATORY_CARE_PROVIDER_SITE_OTHER): Payer: Self-pay | Admitting: Family Medicine

## 2020-06-17 ENCOUNTER — Ambulatory Visit (INDEPENDENT_AMBULATORY_CARE_PROVIDER_SITE_OTHER): Payer: Managed Care, Other (non HMO) | Admitting: Family Medicine

## 2020-06-17 VITALS — BP 122/82 | HR 96 | Temp 97.8°F | Ht 68.0 in | Wt 241.0 lb

## 2020-06-17 DIAGNOSIS — R7989 Other specified abnormal findings of blood chemistry: Secondary | ICD-10-CM

## 2020-06-17 DIAGNOSIS — E1169 Type 2 diabetes mellitus with other specified complication: Secondary | ICD-10-CM | POA: Diagnosis not present

## 2020-06-17 DIAGNOSIS — E785 Hyperlipidemia, unspecified: Secondary | ICD-10-CM

## 2020-06-17 DIAGNOSIS — E559 Vitamin D deficiency, unspecified: Secondary | ICD-10-CM | POA: Diagnosis not present

## 2020-06-17 MED ORDER — VITAMIN D (ERGOCALCIFEROL) 1.25 MG (50000 UNIT) PO CAPS: ORAL_CAPSULE | ORAL | 0 refills | Status: DC

## 2020-06-17 MED ORDER — RYBELSUS 3 MG PO TABS: 3.0000 mg | ORAL_TABLET | Freq: Every day | ORAL | 0 refills | Status: DC

## 2020-06-23 ENCOUNTER — Telehealth: Payer: Self-pay

## 2020-06-23 DIAGNOSIS — Z006 Encounter for examination for normal comparison and control in clinical research program: Secondary | ICD-10-CM

## 2020-06-23 NOTE — Telephone Encounter (Signed)
I called patient for her 90-day Identify Study follow up phone call. Patient is doing well with no cardiac symptoms at this time. I reminded patient I would call her in January for her 1 year follow-up. 

## 2020-06-23 NOTE — Progress Notes (Signed)
Chief Complaint:   OBESITY Maria Johnson is here to discuss her progress with her obesity treatment plan along with follow-up of her obesity related diagnoses.   Today's visit was #: 2 Starting weight: 247 lbs Starting date: 06/03/2020 Today's weight: 241 lbs Today's date: 06/17/2020 Weight change since last visit: 6 lbs Total lbs lost to date: 6 lbs Body mass index is 36.64 kg/m.  Total weight loss percentage to date: -2.43%  Interim History:  Maria Johnson is here today for her first follow-up office visit since starting the program with [place].  All blood work/ lab tests that were recently ordered by myself or an outside provider were reviewed with patient today per their request.   Extended time was spent counseling her on all new disease processes that were discovered or preexisting ones that are worsening.  she understands that many of these abnormalities will need to monitored regularly along with the current treatment plan of prudent dietary changes, in which we are making each and every office visit, to improve these health parameters.  We reviewed her new meal plan in detail and questions were answered.  Patient's food recall appears to be accurate and consistent with what is on plan when she is following it.   When eating on plan, her hunger and cravings are well controlled.    Maria Johnson is drinking 80-100 ounces of water per day.  She says she does not really do any snacking.   -- She has one regular (12 ounce) soda per day.  Drinks juice occasionally.  Overall, she says she likes the plan.   Current Meal Plan: the Category 3 Plan for 95% of the time.  Current Exercise Plan: None.  Assessment/Plan:   Medications Discontinued During This Encounter  Medication Reason  . Vitamin D, Ergocalciferol, (DRISDOL) 1.25 MG (50000 UNIT) CAPS capsule Reorder    Meds ordered this encounter  Medications  . Semaglutide (RYBELSUS) 3 MG TABS    Sig: Take 3 mg by mouth daily.    Dispense:   30 tablet    Refill:  0  . Vitamin D, Ergocalciferol, (DRISDOL) 1.25 MG (50000 UNIT) CAPS capsule    Sig: 1 po q Wed and 1 po q Sun    Dispense:  8 capsule    Refill:  0    1. Type 2 diabetes mellitus with other specified complication, without long-term current use of insulin (HCC) Diabetes Mellitus: Not at goal. Medication: metformin 1,000 mg twice daily, Januvia 100 mg daily. Issues reviewed: blood sugar goals, complications of diabetes mellitus, hypoglycemia prevention and treatment, exercise, and nutrition.  Lowest FBS 83-141.  A1c 7.0.  No family history of thyroid cancer or personal history of pancreatitis.  Plan:  Discussed labs with patient today.  Start Rybelsus 3 mg daily (risks/benefits discussed).  She will consider talking to PCP regarding adding ARB to regimen with her Dm esp since she has increased serum creatinine levels.  The importance of regular follow up with PCP and all other specialists as scheduled was stressed to patient today. The patient will continue to focus on protein-rich, low simple carbohydrate foods. We reviewed the importance of hydration, regular exercise for stress reduction, and restorative sleep.   Lab Results  Component Value Date   HGBA1C 7.0 (H) 06/03/2020   HGBA1C 7.3 (H) 03/09/2020   HGBA1C 7.2 (H) 11/08/2019   Lab Results  Component Value Date   MICROALBUR 1.1 03/09/2020   LDLCALC 75 06/03/2020   CREATININE 1.01 (H)  06/03/2020   - Start Semaglutide (RYBELSUS) 3 MG TABS; Take 3 mg by mouth daily.  Dispense: 30 tablet; Refill: 0  2. Hyperlipidemia associated with type 2 diabetes mellitus (HCC) Course: Controlled. Lipid-lowering medications: Lipitor 40 mg daily.   Plan: Discussed labs with patient today.  Essentially at goal.  On Lipitor per PCP.  Dietary changes: Increase soluble fiber, decrease simple carbohydrates, decrease saturated fat. Exercise changes: Moderate to vigorous-intensity aerobic activity 150 minutes per week or as tolerated. We  will continue to monitor along with PCP/specialists as it pertains to her weight loss journey.  Lab Results  Component Value Date   CHOL 136 06/03/2020   HDL 41 06/03/2020   LDLCALC 75 06/03/2020   TRIG 107 06/03/2020   CHOLHDL 3.3 06/03/2020   Lab Results  Component Value Date   ALT 31 06/03/2020   AST 24 06/03/2020   ALKPHOS 72 06/03/2020   BILITOT <0.2 06/03/2020   The 10-year ASCVD risk score Denman George DC Jr., et al., 2013) is: 3.8%   Values used to calculate the score:     Age: 40 years     Sex: Female     Is Non-Hispanic African American: Yes     Diabetic: Yes     Tobacco smoker: No     Systolic Blood Pressure: 122 mmHg     Is BP treated: Yes     HDL Cholesterol: 41 mg/dL     Total Cholesterol: 136 mg/dL  3. Elevated serum creatinine Maria Johnson's serum creatinine has been elevated for over 2-3 years now.  Never on ACE inh or ARB.  Plan:  Discussed labs with patient today.  Importance of blood pressure and blood sugar control discussed today.  Adequate hydration importance, decrease soda/decrease NSAIDs and best to avoid all together.  4. Vitamin D deficiency Not at goal.  Current vitamin D is 24.8, tested on 06/03/2020. Optimal goal > 50 ng/dL.  She has been taking it weekly for 4 months consistently.   Plan: Discussed labs with patient today.  Still not at goal at 24.8.  Start to take prescription Vitamin D @50 ,000 IU twice weekly as prescribed.  Will recheck vitamin D level in 3-4 months.  - Start Vitamin D, Ergocalciferol, (DRISDOL) 1.25 MG (50000 UNIT) CAPS capsule; 1 po q Wed and 1 po q Sun  Dispense: 8 capsule; Refill: 0  5. Obesity, current BMI 36.7  Course: Maria Johnson is currently in the action stage of change. As such, her goal is to continue with weight loss efforts.   Nutrition goals: She has agreed to the Category 3 Plan.   Exercise goals: As is.  Behavioral modification strategies: decreasing simple carbohydrates, increasing water intake and decreasing liquid  calories.  Maria Johnson has agreed to follow-up with our clinic in 2 weeks. She was informed of the importance of frequent follow-up visits to maximize her success with intensive lifestyle modifications for her multiple health conditions.   Objective:   Blood pressure 122/82, pulse 96, temperature 97.8 F (36.6 C), height 5\' 8"  (1.727 m), weight 241 lb (109.3 kg), last menstrual period 08/09/2007, SpO2 99 %. Body mass index is 36.64 kg/m.  General: Cooperative, alert, well developed, in no acute distress. HEENT: Conjunctivae and lids unremarkable. Cardiovascular: Regular rhythm.  Lungs: Normal work of breathing. Neurologic: No focal deficits.   Lab Results  Component Value Date   CREATININE 1.01 (H) 06/03/2020   BUN 9 06/03/2020   NA 137 06/03/2020   K 4.8 06/03/2020   CL 100 06/03/2020  CO2 20 06/03/2020   Lab Results  Component Value Date   ALT 31 06/03/2020   AST 24 06/03/2020   ALKPHOS 72 06/03/2020   BILITOT <0.2 06/03/2020   Lab Results  Component Value Date   HGBA1C 7.0 (H) 06/03/2020   HGBA1C 7.3 (H) 03/09/2020   HGBA1C 7.2 (H) 11/08/2019   HGBA1C 7.3 (H) 08/07/2019   HGBA1C 6.4 01/18/2019   Lab Results  Component Value Date   INSULIN 33.8 (H) 06/03/2020   Lab Results  Component Value Date   TSH 1.180 06/03/2020   Lab Results  Component Value Date   CHOL 136 06/03/2020   HDL 41 06/03/2020   LDLCALC 75 06/03/2020   TRIG 107 06/03/2020   CHOLHDL 3.3 06/03/2020   Lab Results  Component Value Date   WBC 6.7 06/03/2020   HGB 12.6 06/03/2020   HCT 39.8 06/03/2020   MCV 83 06/03/2020   PLT 416 06/03/2020   Attestation Statements:   Reviewed by clinician on day of visit: allergies, medications, problem list, medical history, surgical history, family history, social history, and previous encounter notes.  Time spent on visit including pre-visit chart review and post-visit care and charting was >50 minutes.   I, Insurance claims handler, CMA, am acting as  Energy manager for [employer], DO.  I have reviewed the above documentation for accuracy and completeness, and I agree with the above. Carlye Grippe, D.O.  The 21st Century Cures Act was signed into law in 2016 which includes the topic of electronic health records.  This provides immediate access to information in MyChart.  This includes consultation notes, operative notes, office notes, lab results and pathology reports.  If you have any questions about what you read please let us know at your next visit so we can discuss your concerns and take corrective action if need be.  We are right here with you.

## 2020-06-24 ENCOUNTER — Other Ambulatory Visit: Payer: Self-pay

## 2020-06-24 ENCOUNTER — Encounter: Payer: Self-pay | Admitting: Family Medicine

## 2020-06-24 ENCOUNTER — Ambulatory Visit (INDEPENDENT_AMBULATORY_CARE_PROVIDER_SITE_OTHER): Payer: Managed Care, Other (non HMO) | Admitting: Family Medicine

## 2020-06-24 VITALS — BP 118/78 | HR 89 | Temp 97.8°F | Ht 69.0 in | Wt 245.2 lb

## 2020-06-24 DIAGNOSIS — D573 Sickle-cell trait: Secondary | ICD-10-CM | POA: Diagnosis not present

## 2020-06-24 DIAGNOSIS — Z0000 Encounter for general adult medical examination without abnormal findings: Secondary | ICD-10-CM | POA: Diagnosis not present

## 2020-06-24 MED ORDER — METFORMIN HCL ER 500 MG PO TB24: ORAL_TABLET | ORAL | 2 refills | Status: DC

## 2020-06-24 NOTE — Progress Notes (Signed)
Chief Complaint  Patient presents with  . Annual Exam     Well Woman Maria Johnson is here for a complete physical.   Her last physical was >1 year ago.  Current diet: in general, a "healthy" diet. Current exercise: some walking. Weight is decreasing and she denies fatigue out of ordinary. Seatbelt? Yes Loss of interested in doing things or depression in past 2 weeks? No  Health Maintenance Tetanus- Yes Hep C screening- Yes HIV screening- Yes  Past Medical History:  Diagnosis Date  . Acne    takes doxocycline daily for acne  . Anemia   . Anxiety   . Atopic dermatitis 04/04/2018  . Back pain   . Bilateral carpal tunnel syndrome 04/04/2018  . Bronchitis    history of  . Carpal tunnel syndrome on both sides   . Chicken pox   . Depression   . Depression   . Diabetes mellitus   . Generalized anxiety disorder 01/08/2014  . Hirsutism 08/07/2019  . Hyperlipidemia   . Insomnia 08/24/2017  . Mixed hyperlipidemia 08/23/2019  . OSA on CPAP 09/25/2017  . 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 Pes planus 10/22/2015  . Sickle cell trait (HCC)   . Sleep apnea    has cpap  . SOBOE (shortness of breath on exertion)   . Sprain of medial collateral ligament of left knee 10/30/2018  . Type 2 diabetes mellitus with hyperglycemia (HCC) 08/12/2013  . Vitamin D deficiency 08/24/2017  . Well adult exam 09/25/2017     Past Surgical History:  Procedure Laterality Date  . ANKLE SURGERY  05/2003   torn cartilage  repair; left  . ANTERIOR LUMBAR FUSION  01/06/2011   Procedure: ANTERIOR LUMBAR FUSION 1 LEVEL;  Surgeon: Dorian Heckle, MD;  Location: [facility];  Service: Neurosurgery;  Laterality: N/A;  Lumbar five-Sacral one  Anterior Lumbar Interbody Fusion with Instrumentation   . BACK SURGERY     Laminectomy Sept 14, 2011,  . DIAGNOSTIC LAPAROSCOPY  01/2002   removed scar tissue  . LAMINECTOMY AND MICRODISCECTOMY LUMBAR SPINE  10/2009   L4, L5, S1  . LUMBAR FUSION  01/06/11    L5-S1  . TONSILLECTOMY  03/2005  . VAGINAL HYSTERECTOMY  08/2007   partial; AUB    Medications  Current Outpatient Medications on File Prior to Visit  Medication Sig Dispense Refill  . ALPRAZolam (XANAX) 0.5 MG tablet TAKE 1 TABLET(0.5 MG) BY MOUTH DAILY AS NEEDED FOR ANXIETY 30 tablet 2  . atorvastatin (LIPITOR) 40 MG tablet Take 1 tablet (40 mg total) by mouth daily. (Patient taking differently: Take 20 mg by mouth daily.) 90 tablet 3  . diclofenac (VOLTAREN) 75 MG EC tablet Take 1 tablet (75 mg total) by mouth 2 (two) times daily. 50 tablet 2  . fluticasone (FLONASE) 50 MCG/ACT nasal spray Place 2 sprays into both nostrils daily. 16 g 1  . metFORMIN (GLUCOPHAGE-XR) 500 MG 24 hr tablet TAKE 2 TABLETS(1000 MG) BY MOUTH IN THE MORNING AND AT BEDTIME (Patient taking differently: Take 750 mg by mouth 2 (two) times daily.) 120 tablet 2  . Semaglutide (RYBELSUS) 3 MG TABS Take 3 mg by mouth daily. 30 tablet 0  . spironolactone (ALDACTONE) 50 MG tablet TAKE 1 TABLET(50 MG) BY MOUTH TWICE DAILY 60 tablet 2  . Vitamin D, Ergocalciferol, (DRISDOL) 1.25 MG (50000 UNIT) CAPS capsule 1 po q Wed and 1 po q Sun 8 capsule 0    Allergies No Known Allergies  Review of Systems: Constitutional:  no unexpected weight changes Eye:  no recent significant change in vision Ear/Nose/Mouth/Throat:  Ears:  no recent change in hearing Nose/Mouth/Throat:  no complaints of nasal congestion, no sore throat Cardiovascular: no chest pain Respiratory:  no shortness of breath Gastrointestinal:  no abdominal pain, no change in bowel habits GU:  Female: negative for dysuria or pelvic pain Musculoskeletal/Extremities:  No new pain of the joints Integumentary (Skin/Breast):  no abnormal skin lesions reported Neurologic:  no headaches Endocrine:  denies fatigue Hematologic/Lymphatic:  No areas of easy bleeding  Exam BP 118/78 (BP Location: Left Arm, Patient Position: Sitting, Cuff Size: Normal)   Pulse 89   Temp  97.8 F (36.6 C) (Oral)   Ht 5\' 9"  (1.753 m)   Wt 245 lb 4 oz (111.2 kg)   LMP 08/09/2007   SpO2 99%   BMI 36.22 kg/m  General:  well developed, well nourished, in no apparent distress Skin:  no significant moles, warts, or growths Head:  no masses, lesions, or tenderness Eyes:  pupils equal and round, sclera anicteric without injection Ears:  canals without lesions, TMs shiny without retraction, no obvious effusion, no erythema Nose:  nares patent, septum midline, mucosa normal, and no drainage or sinus tenderness Throat/Pharynx:  lips and gingiva without lesion; tongue and uvula midline; non-inflamed pharynx; no exudates or postnasal drainage Neck: neck supple without adenopathy, thyromegaly, or masses Lungs:  clear to auscultation, breath sounds equal bilaterally, no respiratory distress Cardio:  regular rate and rhythm, no LE edema Abdomen:  abdomen soft, nontender; bowel sounds normal; no masses or organomegaly Genital: Defer to GYN Musculoskeletal:  symmetrical muscle groups noted without atrophy or deformity Extremities:  no clubbing, cyanosis, or edema, no deformities, no skin discoloration Neuro:  gait antalgic; deep tendon reflexes normal and symmetric Psych: well oriented with normal range of affect and appropriate judgment/insight  Assessment and Plan  Well adult exam  Sickle cell trait (HCC), Chronic  Morbid obesity (HCC) - Plan: Amb Referral to Bariatric Surgery   Well 40 y.o. female. Counseled on diet and exercise. Other orders as above. Toe pain resumed, following with podiatry.  Will stop Januvia as she has started Rybelsus by [facility]WM. A1c 7, goal is <7. She is losing weight so will hold off on making med change. Requested referral to bariatrics, will refer.  Follow up in 3 mo or prn. The patient voiced understanding and agreement to the plan.  [address] [place], DO 06/24/20 8:52 AM

## 2020-06-24 NOTE — Patient Instructions (Addendum)
Keep the diet clean and stay active.  Strong work with your weight loss.  Stop the Januvia.  If you do not hear anything about your referral in the next 1-2 weeks, call our office and ask for an update.  Let us know if you need anything.

## 2020-07-02 ENCOUNTER — Other Ambulatory Visit: Payer: Self-pay

## 2020-07-02 ENCOUNTER — Ambulatory Visit (INDEPENDENT_AMBULATORY_CARE_PROVIDER_SITE_OTHER): Payer: Managed Care, Other (non HMO) | Admitting: Podiatry

## 2020-07-02 ENCOUNTER — Encounter: Payer: Self-pay | Admitting: Podiatry

## 2020-07-02 DIAGNOSIS — M778 Other enthesopathies, not elsewhere classified: Secondary | ICD-10-CM

## 2020-07-03 ENCOUNTER — Encounter: Payer: Self-pay | Admitting: Podiatry

## 2020-07-05 HISTORY — PX: TOE SURGERY: SHX1073

## 2020-07-06 DIAGNOSIS — M79676 Pain in unspecified toe(s): Secondary | ICD-10-CM

## 2020-07-07 NOTE — Progress Notes (Signed)
Subjective:   Patient ID: Maria Johnson, female   DOB: 40 y.o.   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  I actually explained there is no guarantee this will solve her problem and I reviewed with her alternative treatments complications.  Patient is scheduled for surgery and at this time signed consent form after extensive review and understands there is no guarantee in the total recovery can take 6 months to 1 year

## 2020-07-08 ENCOUNTER — Encounter: Payer: Self-pay | Admitting: Podiatry

## 2020-07-08 ENCOUNTER — Telehealth: Payer: Self-pay | Admitting: Urology

## 2020-07-08 NOTE — Telephone Encounter (Signed)
DOS - 08/04/20  METATARSAL OSTEOTOMY 2ND RIGHT --- 73710   [employer] EFFECTIVE DATE - 02/01/19   PER [employer]'S AUTOMATIVE SYSTEM FOR CPT CODE 62694 NO PRIOR AUTH IS REQUIRED.  REF # [id number]

## 2020-07-09 ENCOUNTER — Other Ambulatory Visit: Payer: Self-pay | Admitting: Family Medicine

## 2020-07-09 ENCOUNTER — Ambulatory Visit (INDEPENDENT_AMBULATORY_CARE_PROVIDER_SITE_OTHER): Payer: Managed Care, Other (non HMO) | Admitting: Adult Health

## 2020-07-09 ENCOUNTER — Other Ambulatory Visit: Payer: Self-pay

## 2020-07-09 VITALS — BP 116/79 | HR 80 | Temp 97.6°F | Ht 68.0 in | Wt 238.0 lb

## 2020-07-09 DIAGNOSIS — Z6837 Body mass index (BMI) 37.0-37.9, adult: Secondary | ICD-10-CM | POA: Diagnosis not present

## 2020-07-09 DIAGNOSIS — L680 Hirsutism: Secondary | ICD-10-CM

## 2020-07-09 DIAGNOSIS — E1169 Type 2 diabetes mellitus with other specified complication: Secondary | ICD-10-CM | POA: Diagnosis not present

## 2020-07-09 DIAGNOSIS — E785 Hyperlipidemia, unspecified: Secondary | ICD-10-CM | POA: Diagnosis not present

## 2020-07-14 DIAGNOSIS — M79676 Pain in unspecified toe(s): Secondary | ICD-10-CM

## 2020-07-15 NOTE — Progress Notes (Signed)
Chief Complaint:   OBESITY Maria Johnson is here to discuss her progress with her obesity treatment plan along with follow-up of her obesity related diagnoses. Maria Johnson is on the Category 3 Plan and states she is following her eating plan approximately 90% of the time. Maria Johnson states she is not currently exercising.  Today's visit was #: 3 Starting weight: 247 lbs Starting date: 06/03/2020 Today's weight: 238 lbs Today's date: 07/09/2020 Total lbs lost to date: 9 Total lbs lost since last in-office visit: 3  Interim History: Maria Johnson was started on Rybelsus 3 mg QD on 06/17/2020. She experienced mild nausea and vomiting once. Ambulatory fasting BG have been at goal the last few weeks.  Subjective:   1. Type 2 diabetes mellitus with other specified complication, without long-term current use of insulin (HCC) Maria Johnson is on Metformin XR 500 mg, 2 tabs AM and PM and Rybelsus 3 mg QD (started on 06/17/2020- experienced mild nausea and vomited once).  06/03/2020 A1c 7.0. Her ambulatory fasting BG runs 90-110's.   Lab Results  Component Value Date   HGBA1C 7.0 (H) 06/03/2020   HGBA1C 7.3 (H) 03/09/2020   HGBA1C 7.2 (H) 11/08/2019   Lab Results  Component Value Date   MICROALBUR 1.1 03/09/2020   LDLCALC 75 06/03/2020   CREATININE 1.01 (H) 06/03/2020   Lab Results  Component Value Date   INSULIN 33.8 (H) 06/03/2020    2. Hyperlipidemia associated with type 2 diabetes mellitus (HCC) 06/03/2020 lipid panel- stable. Maria Johnson is on atorvastatin 40 mg QD. She denies myalgias.  Lab Results  Component Value Date   ALT 31 06/03/2020   AST 24 06/03/2020   ALKPHOS 72 06/03/2020   BILITOT <0.2 06/03/2020   Lab Results  Component Value Date   CHOL 136 06/03/2020   HDL 41 06/03/2020   LDLCALC 75 06/03/2020   TRIG 107 06/03/2020   CHOLHDL 3.3 06/03/2020    Assessment/Plan:   1. Type 2 diabetes mellitus with other specified complication, without long-term current use of insulin  (HCC) Good blood sugar control is important to decrease the likelihood of diabetic complications such as nephropathy, neuropathy, limb loss, blindness, coronary artery disease, and death. Intensive lifestyle modification including diet, exercise and weight loss are the first line of treatment for diabetes.  -Do not overeat- stop when full to decrease nausea.  -Continue Metformin and Rybelsus at current dose.  2. Hyperlipidemia associated with type 2 diabetes mellitus (HCC) Cardiovascular risk and specific lipid/LDL goals reviewed.  We discussed several lifestyle modifications today and Maria Johnson will continue to work on diet, exercise and weight loss efforts. Orders and follow up as documented in patient record.  -Continue atorvastatin- managed by PCP.  Counseling Intensive lifestyle modifications are the first line treatment for this issue. Dietary changes: Increase soluble fiber. Decrease simple carbohydrates. Exercise changes: Moderate to vigorous-intensity aerobic activity 150 minutes per week if tolerated. Lipid-lowering medications: see documented in medical record.  3. Obesity, current BMI 36.2  Mildred is currently in the action stage of change. As such, her goal is to continue with weight loss efforts. She has agreed to the Category 3 Plan.   Exercise goals: No exercise has been prescribed at this time.  Behavioral modification strategies: increasing lean protein intake, decreasing simple carbohydrates, meal planning and cooking strategies, keeping healthy foods in the home, and planning for success.  Maria Johnson has agreed to follow-up with our clinic in 2 weeks. She was informed of the importance of frequent follow-up visits to maximize her  success with intensive lifestyle modifications for her multiple health conditions.   Objective:   Blood pressure 116/79, pulse 80, temperature 97.6 F (36.4 C), temperature source Oral, height 5\' 8"  (1.727 m), weight 238 lb (108 kg), last  menstrual period 08/09/2007, SpO2 99 %. Body mass index is 36.19 kg/m.  General: Cooperative, alert, well developed, in no acute distress. HEENT: Conjunctivae and lids unremarkable. Cardiovascular: Regular rhythm.  Lungs: Normal work of breathing. Neurologic: No focal deficits.   Lab Results  Component Value Date   CREATININE 1.01 (H) 06/03/2020   BUN 9 06/03/2020   NA 137 06/03/2020   K 4.8 06/03/2020   CL 100 06/03/2020   CO2 20 06/03/2020   Lab Results  Component Value Date   ALT 31 06/03/2020   AST 24 06/03/2020   ALKPHOS 72 06/03/2020   BILITOT <0.2 06/03/2020   Lab Results  Component Value Date   HGBA1C 7.0 (H) 06/03/2020   HGBA1C 7.3 (H) 03/09/2020   HGBA1C 7.2 (H) 11/08/2019   HGBA1C 7.3 (H) 08/07/2019   HGBA1C 6.4 01/18/2019   Lab Results  Component Value Date   INSULIN 33.8 (H) 06/03/2020   Lab Results  Component Value Date   TSH 1.180 06/03/2020   Lab Results  Component Value Date   CHOL 136 06/03/2020   HDL 41 06/03/2020   LDLCALC 75 06/03/2020   TRIG 107 06/03/2020   CHOLHDL 3.3 06/03/2020   Lab Results  Component Value Date   WBC 6.7 06/03/2020   HGB 12.6 06/03/2020   HCT 39.8 06/03/2020   MCV 83 06/03/2020   PLT 416 06/03/2020   No results found for: IRON, TIBC, FERRITIN  Attestation Statements:   Reviewed by clinician on day of visit: allergies, medications, problem list, medical history, surgical history, family history, social history, and previous encounter notes.  Time spent on visit including pre-visit chart review and post-visit care and charting was 30 minutes.   08/03/2020, CMA, am acting as transcriptionist for Edmund Hilda, NP.  I have reviewed the above documentation for accuracy and completeness, and I agree with the above. -  Timarie Labell d. Ogle Hoeffner, NP-C

## 2020-07-22 ENCOUNTER — Ambulatory Visit (INDEPENDENT_AMBULATORY_CARE_PROVIDER_SITE_OTHER): Payer: Managed Care, Other (non HMO) | Admitting: Family Medicine

## 2020-07-22 ENCOUNTER — Encounter (INDEPENDENT_AMBULATORY_CARE_PROVIDER_SITE_OTHER): Payer: Self-pay | Admitting: Family Medicine

## 2020-07-22 ENCOUNTER — Other Ambulatory Visit: Payer: Self-pay

## 2020-07-22 VITALS — BP 116/79 | HR 90 | Temp 98.0°F | Ht 68.0 in | Wt 240.0 lb

## 2020-07-22 DIAGNOSIS — Z6837 Body mass index (BMI) 37.0-37.9, adult: Secondary | ICD-10-CM

## 2020-07-22 DIAGNOSIS — Z9189 Other specified personal risk factors, not elsewhere classified: Secondary | ICD-10-CM

## 2020-07-22 DIAGNOSIS — E1169 Type 2 diabetes mellitus with other specified complication: Secondary | ICD-10-CM | POA: Diagnosis not present

## 2020-07-22 DIAGNOSIS — E559 Vitamin D deficiency, unspecified: Secondary | ICD-10-CM | POA: Diagnosis not present

## 2020-07-22 MED ORDER — VITAMIN D (ERGOCALCIFEROL) 1.25 MG (50000 UNIT) PO CAPS: ORAL_CAPSULE | ORAL | 0 refills | Status: DC

## 2020-07-22 MED ORDER — RYBELSUS 7 MG PO TABS: 7.0000 mg | ORAL_TABLET | Freq: Every day | ORAL | 0 refills | Status: DC

## 2020-07-29 NOTE — Progress Notes (Signed)
Chief Complaint:   OBESITY Maria Johnson is here to discuss her progress with her obesity treatment plan along with follow-up of her obesity related diagnoses.   Today's visit was #: 4 Starting weight: 247 lbs Starting date: 06/03/2020 Today's weight: 240 lbs Today's date: 07/22/2020 Weight change since last visit: +2 lbs Total lbs lost to date: 7 lbs Body mass index is 36.49 kg/m.  Total weight loss percentage to date: -2.83%  Interim History:  Maria Johnson went away to [place] and ate fried food and only drank water.  She says she got in 15,000 steps per day while there.  Not hungry for breakfast, lunch, or dinner.  She eats on plan, but prefers to eat unhealthy choices.  She also got extensions in her hair that weigh more than 2 pounds.  She is having surgery on July 5 and will be in bed for 1 week.  Current Meal Plan: the Category 3 Plan for 85% of the time.  Current Exercise Plan: Extra walking. Current Anti-Obesity Medications: Rybelsus 3 mg daily. Side effects: None.  Assessment/Plan:   Medications Discontinued During This Encounter  Medication Reason   Vitamin D, Ergocalciferol, (DRISDOL) 1.25 MG (50000 UNIT) CAPS capsule Reorder   Semaglutide (RYBELSUS) 3 MG TABS Dose change   Meds ordered this encounter  Medications   Vitamin D, Ergocalciferol, (DRISDOL) 1.25 MG (50000 UNIT) CAPS capsule    Sig: 1 po q Wed and 1 po q Sun    Dispense:  8 capsule    Refill:  0   Semaglutide (RYBELSUS) 7 MG TABS    Sig: Take 7 mg by mouth daily.    Dispense:  30 tablet    Refill:  0    30 d supply; ov for RF    1. Type 2 diabetes mellitus with other specified complication, without long-term current use of insulin (HCC) Diabetes Mellitus: Not at goal. Medication: Rybelsus 3 mg daily and metformin 1,000 mg daily. Issues reviewed: blood sugar goals, complications of diabetes mellitus, hypoglycemia prevention and treatment, exercise, and nutrition.   Plan:  Stop Rybelsus 3 mg and start  Rybelsus 7 mg daily.  Continue metformin. The importance of regular follow up with PCP and all other specialists as scheduled was stressed to patient today. The patient will continue to focus on protein-rich, low simple carbohydrate foods. We reviewed the importance of hydration, regular exercise for stress reduction, and restorative sleep.   Lab Results  Component Value Date   HGBA1C 7.0 (H) 06/03/2020   HGBA1C 7.3 (H) 03/09/2020   HGBA1C 7.2 (H) 11/08/2019   Lab Results  Component Value Date   MICROALBUR 1.1 03/09/2020   LDLCALC 75 06/03/2020   CREATININE 1.01 (H) 06/03/2020   - Semaglutide (RYBELSUS) 7 MG TABS; Take 7 mg by mouth daily.  Dispense: 30 tablet; Refill: 0  2. Vitamin D deficiency Not at goal.  She is taking vitamin D 50,000 IU twice weekly.  Plan: Continue to take prescription Vitamin D @50 ,000 IU twice weekly as prescribed.  Follow-up for routine testing of Vitamin D, at least 2-3 times per year to avoid over-replacement.  Lab Results  Component Value Date   VD25OH 24.8 (L) 06/03/2020   VD25OH 8.9 (L) 02/04/2020   - Refill Vitamin D, Ergocalciferol, (DRISDOL) 1.25 MG (50000 UNIT) CAPS capsule; 1 po q Wed and 1 po q Sun  Dispense: 8 capsule; Refill: 0  3. At risk for malnutrition Maria Johnson was given extensive malnutrition prevention education and counseling today  of more than 8 minutes.  Counseled her that malnutrition refers to inappropriate nutrients or not the right balance of nutrients for optimal health.  Discussed with Maria Johnson that it is absolutely possible to be malnourished but yet obese.  Risk factors, including but not limited to, inappropriate dietary choices, difficulty with obtaining food due to physical or financial limitations, not eating foods on plan, and various physical and mental health conditions were reviewed with Maria Johnson.    4. Obesity BMI today is 36  Course: Maria Johnson is currently in the action stage of change. As such, her  goal is to continue with weight loss efforts.   Nutrition goals: She has agreed to the Category 3 Plan.   Exercise goals: All adults should avoid inactivity. Some physical activity is better than none, and adults who participate in any amount of physical activity gain some health benefits.  Behavioral modification strategies: avoiding temptations and planning for success.  Maria Johnson has agreed to follow-up with our clinic in 2-3 weeks. She was informed of the importance of frequent follow-up visits to maximize her success with intensive lifestyle modifications for her multiple health conditions.   Objective:   Blood pressure 116/79, pulse 90, temperature 98 F (36.7 C), height 5\' 8"  (1.727 m), weight 240 lb (108.9 kg), last menstrual period 08/09/2007, SpO2 97 %. Body mass index is 36.49 kg/m.  General: Cooperative, alert, well developed, in no acute distress. HEENT: Conjunctivae and lids unremarkable. Cardiovascular: Regular rhythm.  Lungs: Normal work of breathing. Neurologic: No focal deficits.   Lab Results  Component Value Date   CREATININE 1.01 (H) 06/03/2020   BUN 9 06/03/2020   NA 137 06/03/2020   K 4.8 06/03/2020   CL 100 06/03/2020   CO2 20 06/03/2020   Lab Results  Component Value Date   ALT 31 06/03/2020   AST 24 06/03/2020   ALKPHOS 72 06/03/2020   BILITOT <0.2 06/03/2020   Lab Results  Component Value Date   HGBA1C 7.0 (H) 06/03/2020   HGBA1C 7.3 (H) 03/09/2020   HGBA1C 7.2 (H) 11/08/2019   HGBA1C 7.3 (H) 08/07/2019   HGBA1C 6.4 01/18/2019   Lab Results  Component Value Date   INSULIN 33.8 (H) 06/03/2020   Lab Results  Component Value Date   TSH 1.180 06/03/2020   Lab Results  Component Value Date   CHOL 136 06/03/2020   HDL 41 06/03/2020   LDLCALC 75 06/03/2020   TRIG 107 06/03/2020   CHOLHDL 3.3 06/03/2020   Lab Results  Component Value Date   VD25OH 24.8 (L) 06/03/2020   VD25OH 8.9 (L) 02/04/2020   Lab Results  Component Value Date    WBC 6.7 06/03/2020   HGB 12.6 06/03/2020   HCT 39.8 06/03/2020   MCV 83 06/03/2020   PLT 416 06/03/2020   Attestation Statements:   Reviewed by clinician on day of visit: allergies, medications, problem Johnson, medical history, surgical history, family history, social history, and previous encounter notes.  I, 08/03/2020, CMA, am acting as Insurance claims handler for Energy manager, DO.  I have reviewed the above documentation for accuracy and completeness, and I agree with the above. [employer], D.O.  The 21st Century Cures Act was signed into law in 2016 which includes the topic of electronic health records.  This provides immediate access to information in MyChart.  This includes consultation notes, operative notes, office notes, lab results and pathology reports.  If you have any questions about what you  read please let us know at your next visit so we can discuss your concerns and take corrective action if need be.  We are right here with you.

## 2020-08-03 MED ORDER — HYDROCODONE-ACETAMINOPHEN 10-325 MG PO TABS: 1.0000 | ORAL_TABLET | Freq: Three times a day (TID) | ORAL | 0 refills | Status: AC | PRN

## 2020-08-03 NOTE — Addendum Note (Signed)
Addended by: Lenn Sink on: 08/03/2020 05:14 PM   Modules accepted: Orders

## 2020-08-04 ENCOUNTER — Encounter: Payer: Self-pay | Admitting: Podiatry

## 2020-08-04 DIAGNOSIS — M21541 Acquired clubfoot, right foot: Secondary | ICD-10-CM

## 2020-08-05 ENCOUNTER — Other Ambulatory Visit: Payer: Self-pay | Admitting: Family Medicine

## 2020-08-05 NOTE — Progress Notes (Signed)
referral

## 2020-08-10 ENCOUNTER — Other Ambulatory Visit: Payer: Self-pay

## 2020-08-10 ENCOUNTER — Ambulatory Visit (INDEPENDENT_AMBULATORY_CARE_PROVIDER_SITE_OTHER): Payer: Managed Care, Other (non HMO) | Admitting: Podiatry

## 2020-08-10 ENCOUNTER — Ambulatory Visit (INDEPENDENT_AMBULATORY_CARE_PROVIDER_SITE_OTHER): Payer: Managed Care, Other (non HMO)

## 2020-08-10 ENCOUNTER — Ambulatory Visit (INDEPENDENT_AMBULATORY_CARE_PROVIDER_SITE_OTHER): Payer: Managed Care, Other (non HMO) | Admitting: Family Medicine

## 2020-08-10 ENCOUNTER — Encounter: Payer: Managed Care, Other (non HMO) | Admitting: Podiatry

## 2020-08-10 ENCOUNTER — Encounter: Payer: Self-pay | Admitting: Podiatry

## 2020-08-10 VITALS — BP 109/75 | HR 90 | Temp 97.6°F | Ht 68.0 in | Wt 238.0 lb

## 2020-08-10 DIAGNOSIS — M778 Other enthesopathies, not elsewhere classified: Secondary | ICD-10-CM

## 2020-08-10 DIAGNOSIS — Z6837 Body mass index (BMI) 37.0-37.9, adult: Secondary | ICD-10-CM

## 2020-08-10 DIAGNOSIS — Z9889 Other specified postprocedural states: Secondary | ICD-10-CM

## 2020-08-10 DIAGNOSIS — E1169 Type 2 diabetes mellitus with other specified complication: Secondary | ICD-10-CM | POA: Diagnosis not present

## 2020-08-10 DIAGNOSIS — E559 Vitamin D deficiency, unspecified: Secondary | ICD-10-CM | POA: Diagnosis not present

## 2020-08-10 DIAGNOSIS — Z9189 Other specified personal risk factors, not elsewhere classified: Secondary | ICD-10-CM

## 2020-08-10 DIAGNOSIS — E66812 Obesity, class 2: Secondary | ICD-10-CM

## 2020-08-10 NOTE — Progress Notes (Signed)
Subjective:   Patient ID: Maria Johnson, female   DOB: 40 y.o.   MRN: [id number]   HPI Patient states doing very well with surgery very pleased so far   ROS      Objective:  Physical Exam  Neurovascular status intact negative Denna Haggard' sign noted wound edges well coapted right second metatarsal good alignment reduced discomfort noted from preop     Assessment:  Doing well post shortening osteotomy second metatarsal right foot     Plan:  Reviewed condition recommended continued immobilization elevation compression and ice.  Reappoint 3 weeks or earlier if needed  X-rays indicate osteotomies healing well fixation in place metatarsal significantly shorter in excellent alignment with the first and third metatarsal

## 2020-08-20 NOTE — Progress Notes (Signed)
Chief Complaint:   OBESITY Maria Johnson is here to discuss her progress with her obesity treatment plan along with follow-up of her obesity related diagnoses.   Today's visit was #: 5 Starting weight: 247 lbs Starting date: 06/03/2020 Today's weight: 238 lbs Today's date: 08/10/2020 Weight change since last visit: 2 lbs Total lbs lost to date: 9 lbs Body mass index is 36.19 kg/m.  Total weight loss percentage to date: -3.64%  Interim History:  Aliea had surgery on the second toe on her right foot on July 5.  Going well.  She has been on the [employer] since then due to pain medications.  She is off pain medication now and on Advil and will start back on the meal plan today.  No issues.  Current Meal Plan: the Category 3 Plan for 95% of the time.  Current Exercise Plan: None. Current Anti-Obesity Medications: Rybelsus 7 mg daily. Side effects: None.  Assessment/Plan:   Medications Discontinued During This Encounter  Medication Reason   diclofenac (VOLTAREN) 75 MG EC tablet Error   1. Type 2 diabetes mellitus with other specified complication, without long-term current use of insulin (HCC) Diabetes Mellitus: Not at goal. Medication: Rybelsus 7 mg daily and metformin XR 1,000 mg twice daily. Issues reviewed: blood sugar goals, complications of diabetes mellitus, hypoglycemia prevention and treatment, exercise, and nutrition.  Occasionally checking blood sugar - 98, 99, and no lows or highs.  Had her bone broth and chicken noodle soup.  Increased dose of Rybelsus to 7 mg on July 5, and is tolerating it well.  Plan:  Refill Rybelsus today.  Continue metformin. The importance of regular follow up with PCP and all other specialists as scheduled was stressed to patient today. The patient will continue to focus on protein-rich, low simple carbohydrate foods. We reviewed the importance of hydration, regular exercise for stress reduction, and restorative sleep.   Lab Results  Component  Value Date   HGBA1C 7.0 (H) 06/03/2020   HGBA1C 7.3 (H) 03/09/2020   HGBA1C 7.2 (H) 11/08/2019   Lab Results  Component Value Date   MICROALBUR 1.1 03/09/2020   LDLCALC 75 06/03/2020   CREATININE 1.01 (H) 06/03/2020   2. Vitamin D deficiency Not at goal.  She is taking vitamin D 50,000 IU weekly.  Tolerating well.  Medication compliance is good.  Plan: Continue to take prescription Vitamin D @50 ,000 IU every week as prescribed.  Follow-up for routine testing of Vitamin D, at least 2-3 times per year to avoid over-replacement.  Lab Results  Component Value Date   VD25OH 24.8 (L) 06/03/2020   VD25OH 8.9 (L) 02/04/2020   3. At risk for deficient intake of food Reagen was given extensive education and counseling today of more than 8 minutes on risks associated with deficient food intake.  Counseled her on the importance of following our prescribed meal plan and eating adequate amounts of protein.  Discussed with Geana 04/03/2020 that inadequate food intake over longer periods of time can slow their metabolism down significantly.    4. Class 2 severe obesity with serious comorbidity and body mass index (BMI) of 37.0 to 37.9 in adult, unspecified obesity type (HCC)  Course: Isley is currently in the action stage of change. As such, her goal is to continue with weight loss efforts.   Nutrition goals: She has agreed to the Category 3 Plan.   Exercise goals:  Activity per Ortho/Podiatry.  Behavioral modification strategies: increasing lean protein intake, decreasing simple  carbohydrates, and planning for success.  Arsenia has agreed to follow-up with our clinic in 2-3 weeks. She was informed of the importance of frequent follow-up visits to maximize her success with intensive lifestyle modifications for her multiple health conditions.   Objective:   Blood pressure 109/75, pulse 90, temperature 97.6 F (36.4 C), height 5\' 8"  (1.727 m), weight 238 lb (108 kg), last menstrual period  08/09/2007, SpO2 99 %. Body mass index is 36.19 kg/m.  General: Cooperative, alert, well developed, in no acute distress. HEENT: Conjunctivae and lids unremarkable. Cardiovascular: Regular rhythm.  Lungs: Normal work of breathing. Neurologic: No focal deficits.   Lab Results  Component Value Date   CREATININE 1.01 (H) 06/03/2020   BUN 9 06/03/2020   NA 137 06/03/2020   K 4.8 06/03/2020   CL 100 06/03/2020   CO2 20 06/03/2020   Lab Results  Component Value Date   ALT 31 06/03/2020   AST 24 06/03/2020   ALKPHOS 72 06/03/2020   BILITOT <0.2 06/03/2020   Lab Results  Component Value Date   HGBA1C 7.0 (H) 06/03/2020   HGBA1C 7.3 (H) 03/09/2020   HGBA1C 7.2 (H) 11/08/2019   HGBA1C 7.3 (H) 08/07/2019   HGBA1C 6.4 01/18/2019   Lab Results  Component Value Date   INSULIN 33.8 (H) 06/03/2020   Lab Results  Component Value Date   TSH 1.180 06/03/2020   Lab Results  Component Value Date   CHOL 136 06/03/2020   HDL 41 06/03/2020   LDLCALC 75 06/03/2020   TRIG 107 06/03/2020   CHOLHDL 3.3 06/03/2020   Lab Results  Component Value Date   VD25OH 24.8 (L) 06/03/2020   VD25OH 8.9 (L) 02/04/2020   Lab Results  Component Value Date   WBC 6.7 06/03/2020   HGB 12.6 06/03/2020   HCT 39.8 06/03/2020   MCV 83 06/03/2020   PLT 416 06/03/2020   Attestation Statements:   Reviewed by clinician on day of visit: allergies, medications, problem list, medical history, surgical history, family history, social history, and previous encounter notes.  I, 08/03/2020, CMA, am acting as Insurance claims handler for Energy manager, DO.  I have reviewed the above documentation for accuracy and completeness, and I agree with the above. [employer], D.O.  The 21st Century Cures Act was signed into law in 2016 which includes the topic of electronic health records.  This provides immediate access to information in MyChart.  This includes consultation notes, operative notes, office notes,  lab results and pathology reports.  If you have any questions about what you read please let 2017 know at your next visit so we can discuss your concerns and take corrective action if need be.  We are right here with you.

## 2020-08-24 ENCOUNTER — Other Ambulatory Visit: Payer: Self-pay

## 2020-08-24 ENCOUNTER — Encounter (INDEPENDENT_AMBULATORY_CARE_PROVIDER_SITE_OTHER): Payer: Self-pay | Admitting: Family Medicine

## 2020-08-24 ENCOUNTER — Ambulatory Visit (INDEPENDENT_AMBULATORY_CARE_PROVIDER_SITE_OTHER): Payer: Managed Care, Other (non HMO) | Admitting: Family Medicine

## 2020-08-24 ENCOUNTER — Encounter: Payer: Self-pay | Admitting: Family Medicine

## 2020-08-24 VITALS — BP 108/72 | HR 80 | Temp 97.8°F | Ht 68.0 in | Wt 237.0 lb

## 2020-08-24 DIAGNOSIS — E785 Hyperlipidemia, unspecified: Secondary | ICD-10-CM

## 2020-08-24 DIAGNOSIS — Z6837 Body mass index (BMI) 37.0-37.9, adult: Secondary | ICD-10-CM

## 2020-08-24 DIAGNOSIS — E1169 Type 2 diabetes mellitus with other specified complication: Secondary | ICD-10-CM

## 2020-08-24 DIAGNOSIS — Z9189 Other specified personal risk factors, not elsewhere classified: Secondary | ICD-10-CM | POA: Diagnosis not present

## 2020-08-24 DIAGNOSIS — E559 Vitamin D deficiency, unspecified: Secondary | ICD-10-CM

## 2020-08-24 MED ORDER — VITAMIN D (ERGOCALCIFEROL) 1.25 MG (50000 UNIT) PO CAPS: ORAL_CAPSULE | ORAL | 0 refills | Status: DC

## 2020-08-24 MED ORDER — RYBELSUS 7 MG PO TABS: 7.0000 mg | ORAL_TABLET | Freq: Every day | ORAL | 0 refills | Status: DC

## 2020-08-26 ENCOUNTER — Ambulatory Visit (INDEPENDENT_AMBULATORY_CARE_PROVIDER_SITE_OTHER): Payer: Managed Care, Other (non HMO) | Admitting: Family Medicine

## 2020-08-26 ENCOUNTER — Encounter: Payer: Self-pay | Admitting: Family Medicine

## 2020-08-27 NOTE — Telephone Encounter (Signed)
[facility] called stating they received the fax. However, it did not come through right "is black out." Please re-fax to [phone]

## 2020-08-30 NOTE — Progress Notes (Signed)
Chief Complaint:   OBESITY Maria Johnson is here to discuss her progress with her obesity treatment plan along with follow-up of her obesity related diagnoses. Maria Johnson is on the Category 3 Plan and states she is following her eating plan approximately 95% of the time. Maria Johnson states she is not currently exercising.  Today's visit was #: 6 Starting weight: 247 lbs Starting date: 06/03/2020 Today's weight: 237 lbs Today's date: 08/24/2020 Total lbs lost to date: 10 Total lbs lost since last in-office visit: 1  Interim History: Right 2nd toe symptoms since July 5th- pt is still very limited with walking, as she has a lot of pain. She has a follow up with podiatry on 08/31/2020. I advised pt to ask about going to physical therapy for rehab to improve pain and improve mobility.  Subjective:   1. Type 2 diabetes mellitus with other specified complication, without long-term current use of insulin (HCC) Maria Johnson's fasting blood sugars range 95-107 and 2 hours post prandial 135-147. She denies issues or concerns. No hunger and no carb cravings. She reports it being difficult to get all food in at times.    Lab Results  Component Value Date   HGBA1C 7.0 (H) 06/03/2020   HGBA1C 7.3 (H) 03/09/2020   HGBA1C 7.2 (H) 11/08/2019   Lab Results  Component Value Date   MICROALBUR 1.1 03/09/2020   LDLCALC 75 06/03/2020   CREATININE 1.01 (H) 06/03/2020   Lab Results  Component Value Date   INSULIN 33.8 (H) 06/03/2020   2. Hyperlipidemia associated with type 2 diabetes mellitus (HCC) Maria Johnson is taking Lipitor. She has hyperlipidemia and has been trying to improve her cholesterol levels with intensive lifestyle modification including a low saturated fat diet, exercise and weight loss. She denies any chest pain, claudication or myalgias.  Lab Results  Component Value Date   ALT 31 06/03/2020   AST 24 06/03/2020   ALKPHOS 72 06/03/2020   BILITOT <0.2 06/03/2020   Lab Results  Component Value  Date   CHOL 136 06/03/2020   HDL 41 06/03/2020   LDLCALC 75 06/03/2020   TRIG 107 06/03/2020   CHOLHDL 3.3 06/03/2020   3. Vitamin D deficiency She is currently taking prescription vitamin D 50,000 IU each week. She denies nausea, vomiting or muscle weakness.  Lab Results  Component Value Date   VD25OH 24.8 (L) 06/03/2020   VD25OH 8.9 (L) 02/04/2020   4. At risk for hypoglycemia Maria Johnson is at increased risk for hypoglycemia due to changes in diet, diagnosis of diabetes, and/or insulin use. Maria Johnson is not currently taking insulin.   Assessment/Plan:   Orders Placed This Encounter  Procedures   Basic Metabolic Panel (BMET)   Hemoglobin A1c   VITAMIN D 25 Hydroxy (Vit-D Deficiency, Fractures)    Medications Discontinued During This Encounter  Medication Reason   Semaglutide (RYBELSUS) 7 MG TABS Reorder   Vitamin D, Ergocalciferol, (DRISDOL) 1.25 MG (50000 UNIT) CAPS capsule Reorder     Meds ordered this encounter  Medications   Semaglutide (RYBELSUS) 7 MG TABS    Sig: Take 7 mg by mouth daily.    Dispense:  30 tablet    Refill:  0    30 d supply;  ** OV for RF **   Do not send RF request   Vitamin D, Ergocalciferol, (DRISDOL) 1.25 MG (50000 UNIT) CAPS capsule    Sig: 1 po q Wed and 1 po q Sun    Dispense:  8 capsule  Refill:  0    30 d supply;  ** OV for RF **   Do not send RF request     1. Type 2 diabetes mellitus with other specified complication, without long-term current use of insulin (HCC) Good blood sugar control is important to decrease the likelihood of diabetic complications such as nephropathy, neuropathy, limb loss, blindness, coronary artery disease, and death. Intensive lifestyle modification including diet, exercise and weight loss are the first line of treatment for diabetes. Check labs prior to next OV.   Refill- Semaglutide (RYBELSUS) 7 MG TABS; Take 7 mg by mouth daily.  Dispense: 30 tablet; Refill: 0 - Basic Metabolic Panel (BMET) - Hemoglobin  A1c  2. Hyperlipidemia associated with type 2 diabetes mellitus (HCC) Maria Johnson's last check was essentially at goal with LDL. Continue weight loss via prudent nutritional plan and try to increase movement. Cardiovascular risk and specific lipid/LDL goals reviewed.  We discussed several lifestyle modifications today and Maria Johnson will continue to work on diet, exercise and weight loss efforts. Orders and follow up as documented in patient record.   Counseling Intensive lifestyle modifications are the first line treatment for this issue. Dietary changes: Increase soluble fiber. Decrease simple carbohydrates. Exercise changes: Moderate to vigorous-intensity aerobic activity 150 minutes per week if tolerated. Lipid-lowering medications: see documented in medical record.  3. Vitamin D deficiency Low Vitamin D level contributes to fatigue and are associated with obesity, breast, and colon cancer. She agrees to continue to take prescription Vitamin D @50 ,000 IU every week and will follow-up for routine testing of Vitamin D, at least 2-3 times per year to avoid over-replacement. Check labs prior to next OV.  Refill- Vitamin D, Ergocalciferol, (DRISDOL) 1.25 MG (50000 UNIT) CAPS capsule; 1 po q Wed and 1 po q Sun  Dispense: 8 capsule; Refill: 0 - VITAMIN D 25 Hydroxy (Vit-D Deficiency, Fractures)  4. At risk for hypoglycemia Maria Johnson was given approximately 9 minutes of counseling today regarding prevention of hypoglycemia. She was advised of symptoms of hypoglycemia. Maria Johnson was instructed to avoid skipping meals, eat regular protein rich meals and schedule low calorie snacks as needed.   Repetitive spaced learning was employed today to elicit superior memory formation and behavioral change  5. Obesity with current BMI of 36.2  Maria Johnson is currently in the action stage of change. As such, her goal is to continue with weight loss efforts. She has agreed to the Category 3 Plan.   Exercise goals:  Per  podiatry  Behavioral modification strategies: increasing lean protein intake, decreasing simple carbohydrates, increasing water intake, avoiding temptations, and planning for success.  Maria Johnson has agreed to follow-up with our clinic in 2-3 weeks. Okay for pt to come in prior to appt for bloodwork. She was informed of the importance of frequent follow-up visits to maximize her success with intensive lifestyle modifications for her multiple health conditions.   Objective:   Blood pressure 108/72, pulse 80, temperature 97.8 F (36.6 C), height 5\' 8"  (1.727 m), weight 237 lb (107.5 kg), last menstrual period 08/09/2007, SpO2 98 %. Body mass index is 36.04 kg/m.  General: Cooperative, alert, well developed, in no acute distress. HEENT: Conjunctivae and lids unremarkable. Cardiovascular: Regular rhythm.  Lungs: Normal work of breathing. Neurologic: No focal deficits.   Lab Results  Component Value Date   CREATININE 1.01 (H) 06/03/2020   BUN 9 06/03/2020   NA 137 06/03/2020   K 4.8 06/03/2020   CL 100 06/03/2020   CO2 20 06/03/2020  Lab Results  Component Value Date   ALT 31 06/03/2020   AST 24 06/03/2020   ALKPHOS 72 06/03/2020   BILITOT <0.2 06/03/2020   Lab Results  Component Value Date   HGBA1C 7.0 (H) 06/03/2020   HGBA1C 7.3 (H) 03/09/2020   HGBA1C 7.2 (H) 11/08/2019   HGBA1C 7.3 (H) 08/07/2019   HGBA1C 6.4 01/18/2019   Lab Results  Component Value Date   INSULIN 33.8 (H) 06/03/2020   Lab Results  Component Value Date   TSH 1.180 06/03/2020   Lab Results  Component Value Date   CHOL 136 06/03/2020   HDL 41 06/03/2020   LDLCALC 75 06/03/2020   TRIG 107 06/03/2020   CHOLHDL 3.3 06/03/2020   Lab Results  Component Value Date   VD25OH 24.8 (L) 06/03/2020   VD25OH 8.9 (L) 02/04/2020   Lab Results  Component Value Date   WBC 6.7 06/03/2020   HGB 12.6 06/03/2020   HCT 39.8 06/03/2020   MCV 83 06/03/2020   PLT 416 06/03/2020    Attestation Statements:    Reviewed by clinician on day of visit: allergies, medications, problem list, medical history, surgical history, family history, social history, and previous encounter notes.  Edmund Hilda, CMA, am acting as transcriptionist for [employer], DO.  I have reviewed the above documentation for accuracy and completeness, and I agree with the above. Carlye Grippe, D.O.  The 21st Century Cures Act was signed into law in 2016 which includes the topic of electronic health records.  This provides immediate access to information in MyChart.  This includes consultation notes, operative notes, office notes, lab results and pathology reports.  If you have any questions about what you read please let us know at your next visit so we can discuss your concerns and take corrective action if need be.  We are right here with you.

## 2020-08-31 ENCOUNTER — Ambulatory Visit (INDEPENDENT_AMBULATORY_CARE_PROVIDER_SITE_OTHER): Payer: Managed Care, Other (non HMO)

## 2020-08-31 ENCOUNTER — Encounter: Payer: Self-pay | Admitting: Podiatry

## 2020-08-31 ENCOUNTER — Other Ambulatory Visit: Payer: Self-pay

## 2020-08-31 ENCOUNTER — Ambulatory Visit (INDEPENDENT_AMBULATORY_CARE_PROVIDER_SITE_OTHER): Payer: Managed Care, Other (non HMO) | Admitting: Podiatry

## 2020-08-31 DIAGNOSIS — Z9889 Other specified postprocedural states: Secondary | ICD-10-CM

## 2020-08-31 NOTE — Progress Notes (Signed)
Subjective:   Patient ID: Maria Johnson, female   DOB: 40 y.o.   MRN: [id number]   HPI Patient presents today doing well with some occasional tightness but overall big difference in how it feels   ROS      Objective:  Physical Exam  Neurovascular status intact negative Denna Haggard' sign noted wound edges well coapted right with incision intact no drainage      Assessment:  Doing well post shortening osteotomy second right     Plan:  X-rays reviewed sterile compression dressing dispensed ankle stocking advised on gradual return to soft shoe gear over the next 2 weeks with gradual increase in activity levels  X-rays indicate osteotomies healing well fixation in place excellent position of the second metatarsal

## 2020-09-07 ENCOUNTER — Encounter (INDEPENDENT_AMBULATORY_CARE_PROVIDER_SITE_OTHER): Payer: Self-pay | Admitting: Family Medicine

## 2020-09-07 ENCOUNTER — Other Ambulatory Visit: Payer: Self-pay

## 2020-09-07 ENCOUNTER — Ambulatory Visit (INDEPENDENT_AMBULATORY_CARE_PROVIDER_SITE_OTHER): Payer: Managed Care, Other (non HMO) | Admitting: Family Medicine

## 2020-09-07 VITALS — BP 123/84 | HR 92 | Temp 98.3°F | Ht 68.0 in | Wt 236.0 lb

## 2020-09-07 DIAGNOSIS — Z9189 Other specified personal risk factors, not elsewhere classified: Secondary | ICD-10-CM

## 2020-09-07 DIAGNOSIS — R7989 Other specified abnormal findings of blood chemistry: Secondary | ICD-10-CM

## 2020-09-07 DIAGNOSIS — E1169 Type 2 diabetes mellitus with other specified complication: Secondary | ICD-10-CM

## 2020-09-07 DIAGNOSIS — E1159 Type 2 diabetes mellitus with other circulatory complications: Secondary | ICD-10-CM | POA: Diagnosis not present

## 2020-09-07 DIAGNOSIS — E559 Vitamin D deficiency, unspecified: Secondary | ICD-10-CM

## 2020-09-07 DIAGNOSIS — I152 Hypertension secondary to endocrine disorders: Secondary | ICD-10-CM

## 2020-09-07 DIAGNOSIS — Z6837 Body mass index (BMI) 37.0-37.9, adult: Secondary | ICD-10-CM

## 2020-09-07 MED ORDER — VITAMIN D (ERGOCALCIFEROL) 1.25 MG (50000 UNIT) PO CAPS: ORAL_CAPSULE | ORAL | 0 refills | Status: DC

## 2020-09-08 ENCOUNTER — Other Ambulatory Visit: Payer: Self-pay | Admitting: Family Medicine

## 2020-09-08 NOTE — Progress Notes (Signed)
Chief Complaint:   OBESITY Maria Johnson is here to discuss her progress with her obesity treatment plan along with follow-up of her obesity related diagnoses. Maria Johnson is on the Category 3 Plan and states she is following her eating plan approximately 90% of the time. Maria Johnson states she is not currently exercising.  Today's visit was #: 7 Starting weight: 247 lbs Starting date: 06/03/2020 Today's weight: 236 lbs Today's date: 09/07/2020 Total lbs lost to date: 11 Total lbs lost since last in-office visit: 1  Interim History: Maria Johnson's reports the meal plan is going great. She has no issues. "I like it." She is still in a boot after surgery, but is back to work. In 2-3 weeks, she can start walking in a regular shoe.  Subjective:   1. Type 2 diabetes mellitus with other specified complication, without long-term current use of insulin (HCC) Maria Johnson's fasting blood sugars run in the 110's. She reports no lows. Pt is taking Rybelsus and Metformin.  Lab Results  Component Value Date   HGBA1C 6.7 (H) 09/07/2020   HGBA1C 7.0 (H) 06/03/2020   HGBA1C 7.3 (H) 03/09/2020   Lab Results  Component Value Date   MICROALBUR 1.1 03/09/2020   LDLCALC 75 06/03/2020   CREATININE WILL FOLLOW 09/07/2020   Lab Results  Component Value Date   INSULIN 47.8 (H) 09/07/2020   INSULIN 33.8 (H) 06/03/2020   2. Hypertension associated with diabetes (HCC) BP is at goal. She is taking spironolactone.  BP Readings from Last 3 Encounters:  09/07/20 123/84  08/24/20 108/72  08/10/20 109/75   Lab Results  Component Value Date   CREATININE WILL FOLLOW 09/07/2020   CREATININE 1.01 (H) 06/03/2020   CREATININE 1.11 03/09/2020   3. Vitamin D deficiency She is currently taking prescription vitamin D 50,000 IU each week. She denies nausea, vomiting or muscle weakness.  Lab Results  Component Value Date   VD25OH 48.7 09/07/2020   VD25OH 24.8 (L) 06/03/2020   VD25OH 8.9 (L) 02/04/2020   4. Elevated  serum creatinine Lacheryl's 06/03/2020 creatinine was elevated.  BP Readings from Last 3 Encounters:  09/07/20 123/84  08/24/20 108/72  08/10/20 109/75   Lab Results  Component Value Date   CREATININE WILL FOLLOW 09/07/2020   CREATININE 1.01 (H) 06/03/2020   CREATININE 1.11 03/09/2020    Assessment/Plan:   1. Type 2 diabetes mellitus with other specified complication, without long-term current use of insulin (HCC) Good blood sugar control is important to decrease the likelihood of diabetic complications such as nephropathy, neuropathy, limb loss, blindness, coronary artery disease, and death. Intensive lifestyle modification including diet, exercise and weight loss are the first line of treatment for diabetes.  Check labs today.  - Insulin, random - Hemoglobin A1c  2. Hypertension associated with diabetes (HCC) Maria Johnson's BP is stable. She is working on healthy weight loss and exercise to improve blood pressure control. We will watch for signs of hypotension as she continues her lifestyle modifications.  3. Vitamin D deficiency Low Vitamin D level contributes to fatigue and are associated with obesity, breast, and colon cancer. She agrees to continue to take prescription Vitamin D 50,000 IU every week and will follow-up for routine testing of Vitamin D, at least 2-3 times per year to avoid over-replacement. Check labs today.  Refill- Vitamin D, Ergocalciferol, (DRISDOL) 1.25 MG (50000 UNIT) CAPS capsule; 1 po q Wed and 1 po q Sun  Dispense: 8 capsule; Refill: 0  - VITAMIN D 25 Hydroxy (Vit-D Deficiency, Fractures)  4. Elevated serum creatinine Maria Johnson's BP is stable and blood sugar is controlled. Check labs today.  - Basic metabolic panel  5. At risk for hypoglycemia Maria Johnson was given approximately 9 minutes of counseling today regarding prevention of hypoglycemia.  She was advised of symptoms of hypoglycemia.  Maria Johnson was instructed to avoid skipping meals and to eat regular  protein-rich meals as prescribed on the meal plan.  The patient should have readily available low calorie snacks as needed, such as [employer] fruit snack packs, if blood sugar goes too low.   6. Obesity with current BMI 35.9  Maria Johnson is currently in the action stage of change. As such, her goal is to continue with weight loss efforts. She has agreed to the Category 3 Plan with breakfast options and okay for oatmeal.   Exercise goals:  As is  Behavioral modification strategies: increasing lean protein intake, decreasing simple carbohydrates, and planning for success.  Maria Johnson has agreed to follow-up with our clinic in 3 weeks. She was informed of the importance of frequent follow-up visits to maximize her success with intensive lifestyle modifications for her multiple health conditions.   Maria Johnson was informed we would discuss her lab results at her next visit unless there is a critical issue that needs to be addressed sooner. Maria Johnson agreed to keep her next visit at the agreed upon time to discuss these results.  Objective:   Blood pressure 123/84, pulse 92, temperature 98.3 F (36.8 C), height 5\' 8"  (1.727 m), weight 236 lb (107 kg), last menstrual period 08/09/2007, SpO2 99 %. Body mass index is 35.88 kg/m.  General: Cooperative, alert, well developed, in no acute distress. HEENT: Conjunctivae and lids unremarkable. Cardiovascular: Regular rhythm.  Lungs: Normal work of breathing. Neurologic: No focal deficits.   Lab Results  Component Value Date   CREATININE WILL FOLLOW 09/07/2020   BUN WILL FOLLOW 09/07/2020   NA WILL FOLLOW 09/07/2020   K WILL FOLLOW 09/07/2020   CL WILL FOLLOW 09/07/2020   CO2 WILL FOLLOW 09/07/2020   Lab Results  Component Value Date   ALT 31 06/03/2020   AST 24 06/03/2020   ALKPHOS 72 06/03/2020   BILITOT <0.2 06/03/2020   Lab Results  Component Value Date   HGBA1C 6.7 (H) 09/07/2020   HGBA1C 7.0 (H) 06/03/2020   HGBA1C 7.3 (H) 03/09/2020    HGBA1C 7.2 (H) 11/08/2019   HGBA1C 7.3 (H) 08/07/2019   Lab Results  Component Value Date   INSULIN 47.8 (H) 09/07/2020   INSULIN 33.8 (H) 06/03/2020   Lab Results  Component Value Date   TSH 1.180 06/03/2020   Lab Results  Component Value Date   CHOL 136 06/03/2020   HDL 41 06/03/2020   LDLCALC 75 06/03/2020   TRIG 107 06/03/2020   CHOLHDL 3.3 06/03/2020   Lab Results  Component Value Date   VD25OH 48.7 09/07/2020   VD25OH 24.8 (L) 06/03/2020   VD25OH 8.9 (L) 02/04/2020   Lab Results  Component Value Date   WBC 6.7 06/03/2020   HGB 12.6 06/03/2020   HCT 39.8 06/03/2020   MCV 83 06/03/2020   PLT 416 06/03/2020    Attestation Statements:   Reviewed by clinician on day of visit: allergies, medications, problem list, medical history, surgical history, family history, social history, and previous encounter notes.  08/03/2020, CMA, am acting as transcriptionist for Edmund Hilda, DO.  I have reviewed the above documentation for accuracy and completeness, and I agree with the above. [employer]  Wendie Agreste, D.O.  The [place] was signed into law in 2016 which includes the topic of electronic health records.  This provides immediate access to information in MyChart.  This includes consultation notes, operative notes, office notes, lab results and pathology reports.  If you have any questions about what you read please let us know at your next visit so we can discuss your concerns and take corrective action if need be.  We are right here with you.

## 2020-09-11 LAB — BASIC METABOLIC PANEL
BUN/Creatinine Ratio: 11 (ref 9–23)
BUN: 13 mg/dL (ref 6–24)
CO2: 15 mmol/L — ABNORMAL LOW (ref 20–29)
Calcium: 10.7 mg/dL — ABNORMAL HIGH (ref 8.7–10.2)
Chloride: 106 mmol/L (ref 96–106)
Creatinine, Ser: 1.2 mg/dL — ABNORMAL HIGH (ref 0.57–1.00)
Glucose: 107 mg/dL — ABNORMAL HIGH (ref 65–99)
Potassium: 5 mmol/L (ref 3.5–5.2)
Sodium: 145 mmol/L — ABNORMAL HIGH (ref 134–144)
eGFR: 59 mL/min/{1.73_m2} — ABNORMAL LOW (ref >59)

## 2020-09-11 LAB — HEMOGLOBIN A1C
Est. average glucose Bld gHb Est-mCnc: 146 mg/dL
Hgb A1c MFr Bld: 6.7 % — ABNORMAL HIGH (ref 4.8–5.6)

## 2020-09-11 LAB — INSULIN, RANDOM: INSULIN: 47.8 u[IU]/mL — ABNORMAL HIGH (ref 2.6–24.9)

## 2020-09-11 LAB — VITAMIN D 25 HYDROXY (VIT D DEFICIENCY, FRACTURES): Vit D, 25-Hydroxy: 48.7 ng/mL (ref 30.0–100.0)

## 2020-09-14 ENCOUNTER — Other Ambulatory Visit (HOSPITAL_COMMUNITY): Payer: Self-pay | Admitting: Surgery

## 2020-09-14 ENCOUNTER — Other Ambulatory Visit: Payer: Self-pay | Admitting: Surgery

## 2020-09-21 ENCOUNTER — Encounter (INDEPENDENT_AMBULATORY_CARE_PROVIDER_SITE_OTHER): Payer: Self-pay | Admitting: Family Medicine

## 2020-09-21 ENCOUNTER — Other Ambulatory Visit: Payer: Self-pay

## 2020-09-21 ENCOUNTER — Ambulatory Visit (INDEPENDENT_AMBULATORY_CARE_PROVIDER_SITE_OTHER): Payer: Managed Care, Other (non HMO) | Admitting: Family Medicine

## 2020-09-21 VITALS — BP 111/75 | HR 84 | Temp 98.9°F | Ht 68.0 in | Wt 238.0 lb

## 2020-09-21 DIAGNOSIS — E1169 Type 2 diabetes mellitus with other specified complication: Secondary | ICD-10-CM | POA: Diagnosis not present

## 2020-09-21 DIAGNOSIS — Z9189 Other specified personal risk factors, not elsewhere classified: Secondary | ICD-10-CM | POA: Diagnosis not present

## 2020-09-21 DIAGNOSIS — Z6836 Body mass index (BMI) 36.0-36.9, adult: Secondary | ICD-10-CM

## 2020-09-21 DIAGNOSIS — E559 Vitamin D deficiency, unspecified: Secondary | ICD-10-CM | POA: Diagnosis not present

## 2020-09-21 DIAGNOSIS — E66812 Obesity, class 2: Secondary | ICD-10-CM

## 2020-09-21 MED ORDER — VITAMIN D (ERGOCALCIFEROL) 1.25 MG (50000 UNIT) PO CAPS: ORAL_CAPSULE | ORAL | 0 refills | Status: DC

## 2020-09-21 MED ORDER — RYBELSUS 7 MG PO TABS: 7.0000 mg | ORAL_TABLET | Freq: Every day | ORAL | 0 refills | Status: DC

## 2020-09-22 NOTE — Progress Notes (Signed)
Chief Complaint:   OBESITY Maria Johnson is here to discuss her progress with her obesity treatment plan along with follow-up of her obesity related diagnoses. Maria Johnson is on the Category 3 Plan with breakfast options and okay for oatmeal and states she is following her eating plan approximately 90% of the time. Maria Johnson states she is doing 0 minutes 0 times per week.  Today's visit was #: 8 Starting weight: 247 lbs Starting date: 06/03/2020 Today's weight: 238 lbs Today's date: 09/21/2020 Total lbs lost to date: 9 Total lbs lost since last in-office visit: 0  Interim History: Maria Johnson notes she is drinking 100 oz of liquid daily (Gatorade and ginger ale). She states her primary care physician told her to drink 200+ oz daily or more. She is following the meal plan near 100% of the time, except for when she gets GI upset and can not eat for 3-4 days. She still has uncontrollable vomiting episodes 2-3 days per week, and she is follow up with GI for that.   Subjective:   1. Type 2 diabetes mellitus with other specified complication, without long-term current use of insulin (HCC) Maria Johnson's last A1c was 6.7 per the patient. She consistently gets between 80's and 100's for fasting BGs. I discussed labs with the patient today.  2. Vitamin D deficiency Maria Johnson's last calcium level was 10.3 and Vit D level was 48.7. She is currently taking prescription vitamin D 50,000 IU twice weekly. She denies nausea, vomiting or muscle weakness. I discussed labs with the patient today.  3. At risk for dehydration Maria Johnson is at risk for dehydration due to serum Na+ at 144 and poor oral intake of anything for 3-4 days at a time each week.  Assessment/Plan:  No orders of the defined types were placed in this encounter.   Medications Discontinued During This Encounter  Medication Reason   Semaglutide (RYBELSUS) 7 MG TABS Reorder   Vitamin D, Ergocalciferol, (DRISDOL) 1.25 MG (50000 UNIT) CAPS capsule  Reorder     Meds ordered this encounter  Medications   Vitamin D, Ergocalciferol, (DRISDOL) 1.25 MG (50000 UNIT) CAPS capsule    Sig: 1 po q Wed and 1 po q Sun    Dispense:  8 capsule    Refill:  0    30 d supply;  ** OV for RF **   Do not send RF request   Semaglutide (RYBELSUS) 7 MG TABS    Sig: Take 7 mg by mouth daily.    Dispense:  30 tablet    Refill:  0    30 d supply;  ** OV for RF **   Do not send RF request     1. Type 2 diabetes mellitus with other specified complication, without long-term current use of insulin (HCC) Improving A1c. Maria Johnson will continue metformin, and we will refill Ryblesus for 1 month. Good blood sugar control is important to decrease the likelihood of diabetic complications such as nephropathy, neuropathy, limb loss, blindness, coronary artery disease, and death. Intensive lifestyle modification including diet, exercise and weight loss are the first line of treatment for diabetes.   - Semaglutide (RYBELSUS) 7 MG TABS; Take 7 mg by mouth daily.  Dispense: 30 tablet; Refill: 0  2. Vitamin D deficiency Low Vitamin D level contributes to fatigue and are associated with obesity, breast, and colon cancer. Maria Johnson agreed to continue prescription Vitamin D 50,000 IU twice weekly, because her Vit D level is almost at goal, and we will refill for  1 month. She will follow-up for routine testing of Vitamin D, at least 2-3 times per year to avoid over-replacement.  - Vitamin D, Ergocalciferol, (DRISDOL) 1.25 MG (50000 UNIT) CAPS capsule; 1 po q Wed and 1 po q Sun  Dispense: 8 capsule; Refill: 0  3. At risk for dehydration Maria Johnson is at higher than average risk of dehydration.  Maria Johnson was given more than 9 minutes of proper hydration counseling today.  We discussed the signs and symptoms of dehydration, some of which may include muscle cramping, constipation or even orthostatic symptoms.  Counseling on the prevention of dehydration was also provided today.  Maria Johnson  is at risk for dehydration due to weight loss, lifestyle and behavorial habits and possibly due to taking certain medication(s).  She was encouraged to adequately hydrate and monitor fluid status to avoid dehydration as well as weight loss plateaus.  Unless pre-existing renal or cardiopulmonary conditions exist, in which patient was told to limit their fluid intake, I recommended roughly one half of their weight in pounds to be the approximate ounces of non-caloric, non-caffeinated beverages they should drink per day; including more if they are engaging in exercise.  4. Obesity with current BMI of 36.2 Maria Johnson is currently in the action stage of change. As such, her goal is to continue with weight loss efforts. She has agreed to the Category 3 Plan with breakfast options and okay for oatmeal.   Maria Johnson is drink half of her weight in ounces of calorie free, salt free, and caffeine free liquids per day (117 oz daily).  Exercise goals: All adults should avoid inactivity. Some physical activity is better than none, and adults who participate in any amount of physical activity gain some health benefits.  Behavioral modification strategies: increasing water intake, decreasing liquid calories, decreasing sodium intake, and keeping healthy foods in the home.  Maria Johnson has agreed to follow-up with our clinic in 3 weeks. She was informed of the importance of frequent follow-up visits to maximize her success with intensive lifestyle modifications for her multiple health conditions.   Objective:   Blood pressure 111/75, pulse 84, temperature 98.9 F (37.2 C), height 5\' 8"  (1.727 m), weight 238 lb (108 kg), last menstrual period 08/09/2007, SpO2 98 %. Body mass index is 36.19 kg/m.  General: Cooperative, alert, well developed, in no acute distress. HEENT: Conjunctivae and lids unremarkable. Cardiovascular: Regular rhythm.  Lungs: Normal work of breathing. Neurologic: No focal deficits.   Lab Results   Component Value Date   CREATININE 1.20 (H) 09/07/2020   BUN 13 09/07/2020   NA 145 (H) 09/07/2020   K 5.0 09/07/2020   CL 106 09/07/2020   CO2 15 (L) 09/07/2020   Lab Results  Component Value Date   ALT 31 06/03/2020   AST 24 06/03/2020   ALKPHOS 72 06/03/2020   BILITOT <0.2 06/03/2020   Lab Results  Component Value Date   HGBA1C 6.7 (H) 09/07/2020   HGBA1C 7.0 (H) 06/03/2020   HGBA1C 7.3 (H) 03/09/2020   HGBA1C 7.2 (H) 11/08/2019   HGBA1C 7.3 (H) 08/07/2019   Lab Results  Component Value Date   INSULIN 47.8 (H) 09/07/2020   INSULIN 33.8 (H) 06/03/2020   Lab Results  Component Value Date   TSH 1.180 06/03/2020   Lab Results  Component Value Date   CHOL 136 06/03/2020   HDL 41 06/03/2020   LDLCALC 75 06/03/2020   TRIG 107 06/03/2020   CHOLHDL 3.3 06/03/2020   Lab Results  Component Value Date  VD25OH 48.7 09/07/2020   VD25OH 24.8 (L) 06/03/2020   VD25OH 8.9 (L) 02/04/2020   Lab Results  Component Value Date   WBC 6.7 06/03/2020   HGB 12.6 06/03/2020   HCT 39.8 06/03/2020   MCV 83 06/03/2020   PLT 416 06/03/2020   No results found for: IRON, TIBC, FERRITIN  Attestation Statements:   Reviewed by clinician on day of visit: allergies, medications, problem list, medical history, surgical history, family history, social history, and previous encounter notes.   Maria Johnson, am acting as transcriptionist for [employer], DO.  I have reviewed the above documentation for accuracy and completeness, and I agree with the above. Maria Johnson, D.O.  The 21st Century Cures Act was signed into law in 2016 which includes the topic of electronic health records.  This provides immediate access to information in MyChart.  This includes consultation notes, operative notes, office notes, lab results and pathology reports.  If you have any questions about what you read please let us know at your next visit so we can discuss your concerns and take corrective  action if need be.  We are right here with you.

## 2020-09-28 ENCOUNTER — Other Ambulatory Visit: Payer: Self-pay

## 2020-09-28 ENCOUNTER — Ambulatory Visit (HOSPITAL_COMMUNITY)
Admission: RE | Admit: 2020-09-28 | Discharge: 2020-09-28 | Disposition: A | Discharge status: Home / Self Care | Payer: Managed Care, Other (non HMO) | Source: Ambulatory Visit | Attending: Surgery | Admitting: Surgery

## 2020-10-07 ENCOUNTER — Encounter (INDEPENDENT_AMBULATORY_CARE_PROVIDER_SITE_OTHER): Payer: Self-pay | Admitting: Family Medicine

## 2020-10-12 ENCOUNTER — Other Ambulatory Visit: Payer: Self-pay

## 2020-10-12 ENCOUNTER — Encounter (INDEPENDENT_AMBULATORY_CARE_PROVIDER_SITE_OTHER): Payer: Self-pay | Admitting: Family Medicine

## 2020-10-12 ENCOUNTER — Telehealth (INDEPENDENT_AMBULATORY_CARE_PROVIDER_SITE_OTHER): Payer: Managed Care, Other (non HMO) | Admitting: Family Medicine

## 2020-10-12 DIAGNOSIS — E559 Vitamin D deficiency, unspecified: Secondary | ICD-10-CM

## 2020-10-12 DIAGNOSIS — E1169 Type 2 diabetes mellitus with other specified complication: Secondary | ICD-10-CM

## 2020-10-12 DIAGNOSIS — Z6837 Body mass index (BMI) 37.0-37.9, adult: Secondary | ICD-10-CM

## 2020-10-12 MED ORDER — RYBELSUS 7 MG PO TABS: 7.0000 mg | ORAL_TABLET | Freq: Every day | ORAL | 0 refills | Status: DC

## 2020-10-12 MED ORDER — VITAMIN D (ERGOCALCIFEROL) 1.25 MG (50000 UNIT) PO CAPS: ORAL_CAPSULE | ORAL | 0 refills | Status: DC

## 2020-10-12 NOTE — Progress Notes (Signed)
TeleHealth Visit:  Due to the COVID-19 pandemic, this visit was completed with telemedicine (audio/video) technology to reduce patient and provider exposure as well as to preserve personal protective equipment.   Aemilia has verbally consented to this TeleHealth visit. The patient is located at home, the provider is located at the [facility] office. The participants in this visit include the listed provider and patient. The visit was conducted today via video.   Chief Complaint: OBESITY Marvina is here to discuss her progress with her obesity treatment plan along with follow-up of her obesity related diagnoses. Dellamae is on the Category 3 Plan with breakfast options/oatmeal okay and states she is following her eating plan approximately 80% of the time. Yunique states she is walking 30 minutes 2 times per week.  Today's visit was #: 9 Starting weight: 247 lbs Starting date: 06/03/2020  Interim History: Tyesha was sick all last week. She denies fever or chills. She reports sore throat and nasal/sinus congestion. She hasn't seen a doctor for it. No other symptoms.  Subjective:   1. Type 2 diabetes mellitus with other specified complication, without long-term current use of insulin (HCC) Due to medications for cough and congestion, pt's FBS's have been 130's at highest. No concerns.  Lab Results  Component Value Date   HGBA1C 6.7 (H) 09/07/2020   HGBA1C 7.0 (H) 06/03/2020   HGBA1C 7.3 (H) 03/09/2020   Lab Results  Component Value Date   MICROALBUR 1.1 03/09/2020   LDLCALC 75 06/03/2020   CREATININE 1.20 (H) 09/07/2020   Lab Results  Component Value Date   INSULIN 47.8 (H) 09/07/2020   INSULIN 33.8 (H) 06/03/2020   2. Vitamin D deficiency She is currently taking prescription vitamin D 50,000 IU each week. She denies nausea, vomiting or muscle weakness.  Lab Results  Component Value Date   VD25OH 48.7 09/07/2020   VD25OH 24.8 (L) 06/03/2020   VD25OH  8.9 (L) 02/04/2020   Assessment/Plan:  No orders of the defined types were placed in this encounter.   Medications Discontinued During This Encounter  Medication Reason   Vitamin D, Ergocalciferol, (DRISDOL) 1.25 MG (50000 UNIT) CAPS capsule Reorder   Semaglutide (RYBELSUS) 7 MG TABS Reorder     Meds ordered this encounter  Medications   Semaglutide (RYBELSUS) 7 MG TABS    Sig: Take 7 mg by mouth daily.    Dispense:  30 tablet    Refill:  0    30 d supply;  ** OV for RF **   Do not send RF request   Vitamin D, Ergocalciferol, (DRISDOL) 1.25 MG (50000 UNIT) CAPS capsule    Sig: 1 po q Wed and 1 po q Sun    Dispense:  8 capsule    Refill:  0    30 d supply;  ** OV for RF **   Do not send RF request     1. Type 2 diabetes mellitus with other specified complication, without long-term current use of insulin (HCC) We will refill Rybelsus, as per below. Good blood sugar control is important to decrease the likelihood of diabetic complications such as nephropathy, neuropathy, limb loss, blindness, coronary artery disease, and death. Intensive lifestyle modification including diet, exercise and weight loss are the first line of treatment for diabetes.   Refill- Semaglutide (RYBELSUS) 7 MG TABS; Take 7 mg by mouth daily.  Dispense: 30 tablet; Refill: 0  2. Vitamin D deficiency Low Vitamin D level contributes to fatigue and are  associated with obesity, breast, and colon cancer. She agrees to continue to take prescription Vitamin D 50,000 IU every week and will follow-up for routine testing of Vitamin D, at least 2-3 times per year to avoid over-replacement.  Refill- Vitamin D, Ergocalciferol, (DRISDOL) 1.25 MG (50000 UNIT) CAPS capsule; 1 po q Wed and 1 po q Sun  Dispense: 8 capsule; Refill: 0  3. Obesity with current BMI of 36.19  Fanny is currently in the action stage of change. As such, her goal is to continue with weight loss efforts. She has agreed to the Category 3 Plan with  breakfast options.   Exercise goals:  As is  Behavioral modification strategies: increasing lean protein intake and decreasing simple carbohydrates.  Ghadeer has agreed to follow-up with our clinic in Continue current anti-hypertensive regimen.  weeks. She was informed of the importance of frequent follow-up visits to maximize her success with intensive lifestyle modifications for her multiple health conditions.  Objective:   VITALS: Per patient if applicable, see vitals. GENERAL: Alert and in no acute distress. CARDIOPULMONARY: No increased WOB. Speaking in clear sentences.  PSYCH: Pleasant and cooperative. Speech normal rate and rhythm. Affect is appropriate. Insight and judgement are appropriate. Attention is focused, linear, and appropriate.  NEURO: Oriented as arrived to appointment on time with no prompting.   Lab Results  Component Value Date   CREATININE 1.20 (H) 09/07/2020   BUN 13 09/07/2020   NA 145 (H) 09/07/2020   K 5.0 09/07/2020   CL 106 09/07/2020   CO2 15 (L) 09/07/2020   Lab Results  Component Value Date   ALT 31 06/03/2020   AST 24 06/03/2020   ALKPHOS 72 06/03/2020   BILITOT <0.2 06/03/2020   Lab Results  Component Value Date   HGBA1C 6.7 (H) 09/07/2020   HGBA1C 7.0 (H) 06/03/2020   HGBA1C 7.3 (H) 03/09/2020   HGBA1C 7.2 (H) 11/08/2019   HGBA1C 7.3 (H) 08/07/2019   Lab Results  Component Value Date   INSULIN 47.8 (H) 09/07/2020   INSULIN 33.8 (H) 06/03/2020   Lab Results  Component Value Date   TSH 1.180 06/03/2020   Lab Results  Component Value Date   CHOL 136 06/03/2020   HDL 41 06/03/2020   LDLCALC 75 06/03/2020   TRIG 107 06/03/2020   CHOLHDL 3.3 06/03/2020   Lab Results  Component Value Date   VD25OH 48.7 09/07/2020   VD25OH 24.8 (L) 06/03/2020   VD25OH 8.9 (L) 02/04/2020   Lab Results  Component Value Date   WBC 6.7 06/03/2020   HGB 12.6 06/03/2020   HCT 39.8 06/03/2020   MCV 83 06/03/2020   PLT 416 06/03/2020   No  results found for: IRON, TIBC, FERRITIN  Attestation Statements:   Reviewed by clinician on day of visit: allergies, medications, problem list, medical history, surgical history, family history, social history, and previous encounter notes.  Edmund Hilda, CMA, am acting as transcriptionist for [employer], DO.  I have reviewed the above documentation for accuracy and completeness, and I agree with the above. Carlye Grippe, D.O.  The 21st Century Cures Act was signed into law in 2016 which includes the topic of electronic health records.  This provides immediate access to information in MyChart.  This includes consultation notes, operative notes, office notes, lab results and pathology reports.  If you have any questions about what you read please let us know at your next visit so we can discuss your concerns and take corrective  action if need be.  We are right here with you.

## 2020-10-14 ENCOUNTER — Other Ambulatory Visit: Payer: Self-pay

## 2020-10-14 ENCOUNTER — Telehealth (INDEPENDENT_AMBULATORY_CARE_PROVIDER_SITE_OTHER): Payer: Managed Care, Other (non HMO) | Admitting: Family Medicine

## 2020-10-14 ENCOUNTER — Encounter: Payer: Managed Care, Other (non HMO) | Attending: Surgery | Admitting: Skilled Nursing Facility1

## 2020-10-14 ENCOUNTER — Encounter: Payer: Self-pay | Admitting: Skilled Nursing Facility1

## 2020-10-14 ENCOUNTER — Encounter: Payer: Self-pay | Admitting: Family Medicine

## 2020-10-14 VITALS — BP 117/78 | HR 81 | Temp 97.6°F | Wt 240.0 lb

## 2020-10-14 DIAGNOSIS — J0110 Acute frontal sinusitis, unspecified: Secondary | ICD-10-CM

## 2020-10-14 DIAGNOSIS — E1169 Type 2 diabetes mellitus with other specified complication: Secondary | ICD-10-CM

## 2020-10-14 MED ORDER — PROMETHAZINE-DM 6.25-15 MG/5ML PO SYRP: 5.0000 mL | ORAL_SOLUTION | Freq: Four times a day (QID) | ORAL | 0 refills | Status: DC | PRN

## 2020-10-14 MED ORDER — PREDNISONE 20 MG PO TABS: 40.0000 mg | ORAL_TABLET | Freq: Every day | ORAL | 0 refills | Status: AC

## 2020-10-14 MED ORDER — DOXYCYCLINE HYCLATE 100 MG PO TABS
100.0000 mg | ORAL_TABLET | Freq: Two times a day (BID) | ORAL | 0 refills | Status: DC
Start: 2020-10-14 — End: 2020-10-29

## 2020-10-14 NOTE — Progress Notes (Signed)
Chief Complaint  Patient presents with   Cough   Nasal Congestion   Headache    Maria Johnson here for URI complaints. Due to COVID-19 pandemic, we are interacting via web portal for an electronic face-to-face visit. I verified patient's ID using 2 identifiers. Patient agreed to proceed with visit via this method. Patient is at home, I am at office. Patient and I are present for visit.   Duration: 10 days  Associated symptoms: sinus congestion, sinus pain, rhinorrhea, myalgia, and cough Denies: itchy watery eyes, ear pain, ear drainage, sore throat, wheezing, shortness of breath, and fevers, N/V/D, loss of taste/smell Treatment to date: Nyquil, Tylenol, Dayquil Sick contacts: Yes; son Tested neg for covid.   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   Pes planus 10/22/2015   Sickle cell trait (HCC)    Sleep apnea    has cpap   SOBOE (shortness of breath on exertion)    Sprain of medial collateral ligament of left knee 10/30/2018   Type 2 diabetes mellitus with hyperglycemia (HCC) 08/12/2013   Vitamin D deficiency 08/24/2017   Well adult exam 09/25/2017    Objective BP 117/78   Pulse 81   Temp 97.6 F (36.4 C)   Wt 240 lb (108.9 kg)   LMP 08/09/2007   SpO2 98%   BMI 35.44 kg/m  No conversational dyspnea Age appropriate judgment and insight Nml affect and mood  Acute frontal sinusitis, recurrence not specified - Plan: predniSONE (DELTASONE) 20 MG tablet, doxycycline (VIBRA-TABS)  100 MG tablet, promethazine-dextromethorphan (PROMETHAZINE-DM) 6.25-15 MG/5ML syrup  5 d pred burst 40 mg/d, in 3 d will start doxy if no better. Cough syrup prn. Warned about drowsiness.  Continue to push fluids, practice good hand hygiene, cover mouth when coughing. F/u prn. If starting to experience fevers, shaking, or shortness of breath, seek immediate care. Pt voiced understanding and agreement to the plan.  Jilda Roche Ackerly, DO 10/14/20 2:41 PM

## 2020-10-14 NOTE — Progress Notes (Signed)
Nutrition Assessment for Bariatric Surgery Medical Nutrition Therapy Appt Start Time: 8:10    End Time: 9:10  Patient was seen on 10/14/2020 for Pre-Operative Nutrition Assessment. Letter of approval faxed to [facility] bariatric surgery program coordinator on 10/14/2020.   Referral stated Supervised Weight Loss (SWL) visits needed: 0  Pt completed visits.   Pt has cleared nutrition requirements.   Planned surgery: sleeve gastrectomy  Pt expectation of surgery: to lose weight Pt expectation of dietitian: none identified     NUTRITION ASSESSMENT   Anthropometrics  Start weight at [facility]: 240.3 lbs (date: 10/14/2020)  Height: 69 in BMI: 35.49 kg/m2  (did advised pt she is on the cusp of not qualifying for surgery per her BMI, pt verbally understood)    Clinical  Medical hx: DM, hyperlipidemia  Medications: Rybelsus, metofrmin atorvastatin, spirolactone, xanax, vitamin D  Labs: vitamin D 48.7, A1C 6.7, creatinine 1.2. GFR 59, sodium 145, calcium 10.7 Notable signs/symptoms: migraines Any previous deficiencies? Yes, vitamin D  Micronutrient Nutrition Focused Physical Exam: Hair: No issues observed Eyes: No issues observed Mouth: No issues observed Neck: No issues observed Nails: No issues observed Skin: No issues observed  Lifestyle & Dietary Hx  Pt states when she got a sedentary job she realized she was a bored eater so she corrected that behavior.   Pt states she walks 2 times a week 30 minutes a time.   24-Hr Dietary Recall First Meal: 2 sausage links 3 eggs + dry toast Snack: yogurt Second Meal: chicken cesar salad  Snack: pickles Third Meal: 10 ounces salmon 1 cup collard greens whole mac n cheese  Snack:  Beverages: water, unsweet tea, soda   Estimated Energy Needs Calories: 1600  NUTRITION DIAGNOSIS  Overweight/obesity ([place]-3.3) related to past poor dietary habits and physical inactivity as evidenced by patient w/ planned sleeve gastrectomy  surgery following dietary guidelines for continued weight loss.    NUTRITION INTERVENTION  Nutrition counseling (C-1) and education (E-2) to facilitate bariatric surgery goals.  Educated pt on micronutrient deficiencies post surgery and strategies to mitigate that risk   Pre-Op Goals Reviewed with the Patient Track food and beverage intake (pen and paper, MyFitness Pal, Baritastic app, etc.) Make healthy food choices while monitoring portion sizes Consume 3 meals per day or try to eat every 3-5 hours Avoid concentrated sugars and fried foods Keep sugar & fat in the single digits per serving on food labels Practice CHEWING your food (aim for applesauce consistency) Practice not drinking 15 minutes before, during, and 30 minutes after each meal and snack Avoid all carbonated beverages (ex: soda, sparkling beverages)  Limit caffeinated beverages (ex: coffee, tea, energy drinks) Avoid all sugar-sweetened beverages (ex: regular soda, sports drinks)  Avoid alcohol  Aim for 64-100 ounces of FLUID daily (with at least half of fluid intake being plain water)  Aim for at least 60-80 grams of PROTEIN daily Look for a liquid protein source that contains ?15 g protein and ?5 g carbohydrate (ex: shakes, drinks, shots) Make a list of non-food related activities Physical activity is an important part of a healthy lifestyle so keep it moving! The goal is to reach 150 minutes of exercise per week, including cardiovascular and weight baring activity.  *Goals that are bolded indicate the pt would like to start working towards these  Handouts Provided Include  Bariatric Surgery handouts (Nutrition Visits, Pre-Op Goals, Protein Shakes, Vitamins & Minerals)  Learning Style & Readiness for Change Teaching method utilized: Visual & Auditory  Demonstrated degree of understanding via: Teach Back  Readiness Level: action Barriers to learning/adherence to lifestyle change: unidentified      MONITORING &  EVALUATION Dietary intake, weekly physical activity, body weight, and pre-op goals reached at next nutrition visit.    Next Steps  Patient is to follow up at [facility] for Pre-Op Class >2 weeks before surgery for further nutrition education.   Pt has completed visits. No further supervised visits required/recomended

## 2020-10-16 ENCOUNTER — Ambulatory Visit: Payer: Managed Care, Other (non HMO) | Admitting: Family Medicine

## 2020-10-24 ENCOUNTER — Other Ambulatory Visit: Payer: Self-pay | Admitting: Family Medicine

## 2020-10-26 NOTE — Telephone Encounter (Signed)
Last refill on 12/25/2019----#30 with 2 refills Last OV---10/14/2020

## 2020-10-28 ENCOUNTER — Encounter (INDEPENDENT_AMBULATORY_CARE_PROVIDER_SITE_OTHER): Payer: Self-pay | Admitting: Family Medicine

## 2020-10-29 ENCOUNTER — Other Ambulatory Visit: Payer: Self-pay | Admitting: Family Medicine

## 2020-10-29 MED ORDER — BENZONATATE 100 MG PO CAPS: 100.0000 mg | ORAL_CAPSULE | Freq: Three times a day (TID) | ORAL | 0 refills | Status: DC | PRN

## 2020-11-02 ENCOUNTER — Encounter: Payer: Managed Care, Other (non HMO) | Attending: Surgery | Admitting: Skilled Nursing Facility1

## 2020-11-02 ENCOUNTER — Ambulatory Visit (INDEPENDENT_AMBULATORY_CARE_PROVIDER_SITE_OTHER): Payer: Managed Care, Other (non HMO) | Admitting: Family Medicine

## 2020-11-02 ENCOUNTER — Other Ambulatory Visit: Payer: Self-pay

## 2020-11-02 DIAGNOSIS — E1169 Type 2 diabetes mellitus with other specified complication: Secondary | ICD-10-CM | POA: Diagnosis not present

## 2020-11-02 NOTE — Progress Notes (Signed)
Pre-Operative Nutrition Class:    Patient was seen on 11/02/2020 for Pre-Operative Bariatric Surgery Education at [facility]    Surgery date: 12/08/2020 Surgery type: sleeve gastrectomy Start weight at [facility]: 240.3 Weight today: 240.5  Samples given per MNT protocol. Patient educated on appropriate usage:  Bariatric Advantage Multivitamin Lot # [id number] Exp: 08/23   Procare Calcium  Lot # [id number] Exp: 03/23   Bariatric Advantage protein powder Lot # [id number] Exp: 10/23  The following the learning objectives were met by the patient during this course: Identify Pre-Op Dietary Goals and will begin 2 weeks pre-operatively Identify appropriate sources of fluids and proteins  State protein recommendations and appropriate sources pre and post-operatively Identify Post-Operative Dietary Goals and will follow for 2 weeks post-operatively Identify appropriate multivitamin and calcium sources Describe the need for physical activity post-operatively and will follow MD recommendations State when to call healthcare provider regarding medication questions or post-operative complications When having a diagnosis of diabetes understanding hypoglycemia symptoms and the inclusion of 1 complex carbohydrate per meal  Handouts given during class include: Pre-Op Bariatric Surgery Diet Handout Protein Shake Handout Post-Op Bariatric Surgery Nutrition Handout BEL[facility] Program Information Flyer Support Group Information Flyer [facility] Bariatric Supplements Price List  Follow-Up Plan: Patient will follow-up at [facility] 2 weeks post operatively for diet advancement per MD.

## 2020-11-04 ENCOUNTER — Ambulatory Visit: Payer: Self-pay | Admitting: Surgery

## 2020-11-04 NOTE — H&P (Signed)
Maria Johnson [id number]   Referring Provider:  Self   Subjective   Chief Complaint: morbid obesity    History of Present Illness: Returns for follow-up regarding surgical treatment of morbid obesity.  She denies any changes in her health since our initial meeting 2 months ago. She has completed the bariatric pathway without any barriers identified, and is currently scheduled for surgery on November 8.  She has several insightful questions to discuss today. CXR/UGI 09/28/2020: Negative, no hiatal hernia Labs done in May and August of this year reviewed. Dietitian-approved, 10/14/2020 Jon Gills [place])  Initial visit Very pleasant 09/11/20: 40yo woman with diabetes, Vit D deficiency, hyperlipidemia presents in consultation for surgical treatment of obesity. She has struggled with this since young adulthood and three pregnancies, the last of which she suffered with gestational diabetes. She has worked incredibly hard and at significant cost to try and lose the weight, with minimal result. She has tried diets, exercise programs, medications. Currently followed at the [facility] and is on rybelsus, losing some weight with this. She suffers additionally with multiple orthopedic issues and has had 2 spine surgeries among others. She is interested in sleeve gastrectomy in order to improve her long term health, reduce her joint/back pain, and improve her diabetes.  She works in Programmer, applications.    Review of Systems: A complete review of systems was obtained from the patient.  I have reviewed this information and discussed as appropriate with the patient.  See HPI as well for other ROS.   Medical History: Past Medical History:  Diagnosis Date   Anemia    Anxiety    Diabetes mellitus without complication (CMS-HCC)    Sleep apnea     There is no problem list on file for this patient.   History reviewed. No pertinent surgical history.   No Known Allergies  Current Outpatient Medications  on File Prior to Visit  Medication Sig Dispense Refill   ALPRAZolam (XANAX) 0.5 MG tablet TAKE 1 TABLET(0.5 MG) BY MOUTH DAILY AS NEEDED FOR ANXIETY     atorvastatin (LIPITOR) 40 MG tablet Take 40 mg by mouth once daily     metFORMIN (GLUCOPHAGE-XR) 750 MG XR tablet Take by mouth     RYBELSUS 7 mg tablet Take 7 mg by mouth once daily     spironolactone (ALDACTONE) 50 MG tablet Take 50 mg by mouth 2 (two) times daily     No current facility-administered medications on file prior to visit.    History reviewed. No pertinent family history.   Social History   Tobacco Use  Smoking Status Never Smoker  Smokeless Tobacco Never Used     Social History   Socioeconomic History   Marital status: Married  Tobacco Use   Smoking status: Never Smoker   Smokeless tobacco: Never Used  Building services engineer Use: Never used  Substance and Sexual Activity   Alcohol use: Not Currently   Drug use: Defer   Sexual activity: Defer    Objective:    Vitals:   11/04/20 1419  BP: (!) 140/80  Pulse: (!) 114  Temp: 36.7 C (98 F)  SpO2: 100%  Weight: (!) 110 kg (242 lb 9.6 oz)  Height: 175.3 cm (5\' 9" )    Body mass index is 35.83 kg/m.  Alert, well appearing Unlabored respirations     Assessment and Plan:  There are no diagnoses linked to this encounter.   She remains an excellent candidate for sleeve gastrectomy. We have previously  discussed the surgery including technical aspects, the risks of bleeding, infection, pain, scarring, injury to intra-abdominal structures, staple line leak or abscess, chronic abdominal pain or nausea, new onset or worsened GERD, DVT/PE, pneumonia, heart attack, stroke, death, failure to reach weight loss goals and weight regain, hernia.  Discussed the typical peri-, and postoperative course.  Discussed the importance of lifelong behavioral changes to combat the chronic and relapsing disease which is obesity. Questions welcomed and answered to her satisfaction.   Plan to proceed as scheduled with sleeve gastrectomy on 12/08/2020    Savio Albrecht Carlye Grippe, MD

## 2020-11-27 NOTE — Patient Instructions (Signed)
DUE TO COVID-19 ONLY ONE VISITOR IS ALLOWED TO COME WITH YOU AND STAY IN THE WAITING ROOM ONLY DURING PRE OP AND PROCEDURE.   **NO VISITORS ARE ALLOWED IN THE SHORT STAY AREA OR RECOVERY ROOM!!**  IF YOU WILL BE ADMITTED INTO THE HOSPITAL YOU ARE ALLOWED ONLY TWO SUPPORT PEOPLE DURING VISITATION HOURS ONLY (7 AM -8PM)   The support person(s) must pass our screening, gel in and out, and wear a mask at all times, including in the patient's room. Patients must also wear a mask when staff or their support person are in the room. Visitors GUEST BADGE MUST BE WORN VISIBLY  One adult visitor may remain with you overnight and MUST be in the room by 8 P.M.  No visitors under the age of 67. Any visitor under the age of 54 must be accompanied by an adult.    COVID SWAB TESTING MUST BE COMPLETED ON:  12/04/20 **MUST PRESENT COMPLETED FORM AT TESTING SITE**    [address] [place] [place] (backside of the building) You are not required to quarantine, however you are required to wear a well-fitted mask when you are out and around people not in your household.  Hand Hygiene often Do NOT share personal items Notify your provider if you are in close contact with someone who has COVID or you develop fever 100.4 or greater, new onset of sneezing, cough, sore throat, shortness of breath or body aches.  [facility] Entrance [address], [address], must go inside of the hospital, NOT A DRIVE THRU!  (Must self quarantine after testing. Follow instructions on handout.)       Your procedure is scheduled on: 12/08/20   Report to [facility] Main Entrance    Report to admitting at: 8:45 AM   Call this number if you have problems the morning of surgery [phone]   Do not eat food :After Midnight.   MORNING OF SURGERY DRINK:   DRINK 1 G2 drink BEFORE YOU LEAVE HOME ( at: 8:00), DRINK ALL OF THE  G2 DRINK AT ONE TIME.   NO SOLID FOOD AFTER 600 PM THE  NIGHT BEFORE YOUR SURGERY. YOU MAY DRINK CLEAR FLUIDS. THE G2 DRINK YOU DRINK BEFORE YOU LEAVE HOME WILL BE THE LAST FLUIDS YOU DRINK BEFORE SURGERY.  PAIN IS EXPECTED AFTER SURGERY AND WILL NOT BE COMPLETELY ELIMINATED. AMBULATION AND TYLENOL WILL HELP REDUCE INCISIONAL AND GAS PAIN. MOVEMENT IS KEY!  YOU ARE EXPECTED TO BE OUT OF BED WITHIN 4 HOURS OF ADMISSION TO YOUR PATIENT ROOM.  SITTING IN THE RECLINER THROUGHOUT THE DAY IS IMPORTANT FOR DRINKING FLUIDS AND MOVING GAS THROUGHOUT THE GI TRACT.  COMPRESSION STOCKINGS SHOULD BE WORN [facility] STAY UNLESS YOU ARE WALKING.   INCENTIVE SPIROMETER SHOULD BE USED EVERY HOUR WHILE AWAKE TO DECREASE POST-OPERATIVE COMPLICATIONS SUCH AS PNEUMONIA.  WHEN DISCHARGED HOME, IT IS IMPORTANT TO CONTINUE TO WALK EVERY HOUR AND USE THE INCENTIVE SPIROMETER EVERY HOUR.    CLEAR LIQUID DIET until 8:00 AM.  Foods Allowed                                                                     Foods Excluded  Water,  Black Coffee and tea, regular and decaf                             liquids that you cannot  Plain Jell-O in any flavor  (No red)                                           see through such as: Fruit ices (not with fruit pulp)                                     milk, soups, orange juice              Iced Popsicles (No red)                                    All solid food                                   Apple juices Sports drinks like Gatorade (No red) Lightly seasoned clear broth or consume(fat free) Sugar  Sample Menu Breakfast                                Lunch                                     Supper Cranberry juice                    Beef broth                            Chicken broth Jell-O                                     Grape juice                           Apple juice Coffee or tea                        Jell-O                                      Popsicle                                                Coffee  or tea                        Coffee or tea    Oral Hygiene is also important to reduce your risk of infection.  Remember - BRUSH YOUR TEETH THE MORNING OF SURGERY WITH YOUR REGULAR TOOTHPASTE   Do NOT smoke after Midnight   Take these medicines the morning of surgery with A SIP OF WATER: alprazolam as needed.  How to Manage Your Diabetes Before and After Surgery  Why is it important to control my blood sugar before and after surgery? Improving blood sugar levels before and after surgery helps healing and can limit problems. A way of improving blood sugar control is eating a healthy diet by:  Eating less sugar and carbohydrates  Increasing activity/exercise  Talking with your doctor about reaching your blood sugar goals High blood sugars (greater than 180 mg/dL) can raise your risk of infections and slow your recovery, so you will need to focus on controlling your diabetes during the weeks before surgery. Make sure that the doctor who takes care of your diabetes knows about your planned surgery including the date and location.  How do I manage my blood sugar before surgery? Check your blood sugar at least 4 times a day, starting 2 days before surgery, to make sure that the level is not too high or low. Check your blood sugar the morning of your surgery when you wake up and every 2 hours until you get to the Short Stay unit. If your blood sugar is less than 70 mg/dL, you will need to treat for low blood sugar: Do not take insulin. Treat a low blood sugar (less than 70 mg/dL) with  cup of clear juice (cranberry or apple), 4 glucose tablets, OR glucose gel. Recheck blood sugar in 15 minutes after treatment (to make sure it is greater than 70 mg/dL). If your blood sugar is not greater than 70 mg/dL on recheck, call [phone] for further instructions. Report your blood sugar to the short stay nurse when you get to Short Stay.  If you are admitted to the  hospital after surgery: Your blood sugar will be checked by the staff and you will probably be given insulin after surgery (instead of oral diabetes medicines) to make sure you have good blood sugar levels. The goal for blood sugar control after surgery is 80-180 mg/dL.   WHAT DO I DO ABOUT MY DIABETES MEDICATION?  Do not take oral diabetes medicines (pills) the morning of surgery.  THE DAY BEFORE SURGERY, take metformin as usual.      THE MORNING OF SURGERY, DO NOT TAKE ANY ORAL DIABETIC MEDICATIONS DAY OF YOUR SURGERY                              You may not have any metal on your body including hair pins, jewelry, and body piercing             Do not wear make-up, lotions, powders, perfumes/cologne, or deodorant  Do not wear nail polish including gel and S&S, artificial/acrylic nails, or any other type of covering on natural nails including finger and toenails. If you have artificial nails, gel coating, etc. that needs to be removed by a nail salon please have this removed prior to surgery or surgery may need to be canceled/ delayed if the surgeon/ anesthesia feels like they are unable to be safely monitored.   Do not shave  48 hours prior to surgery.    Do not bring valuables to the hospital. [facility] IS NOT             RESPONSIBLE   FOR  VALUABLES.   Contacts, dentures or bridgework may not be worn into surgery.   Bring small overnight bag day of surgery.    Patients discharged on the day of surgery will not be allowed to drive home.   Special Instructions: Bring a copy of your healthcare power of attorney and living will documents         the day of surgery if you haven't scanned them before.              Please read over the following fact sheets you were given: IF YOU HAVE QUESTIONS ABOUT YOUR PRE-OP INSTRUCTIONS PLEASE CALL [phone]     [facility] - Preparing for Surgery Before surgery, you can play an important role.  Because skin is not sterile, your skin needs  to be as free of germs as possible.  You can reduce the number of germs on your skin by washing with CHG (chlorahexidine gluconate) soap before surgery.  CHG is an antiseptic cleaner which kills germs and bonds with the skin to continue killing germs even after washing. Please DO NOT use if you have an allergy to CHG or antibacterial soaps.  If your skin becomes reddened/irritated stop using the CHG and inform your nurse when you arrive at Short Stay. Do not shave (including legs and underarms) for at least 48 hours prior to the first CHG shower.  You may shave your face/neck. Please follow these instructions carefully:  1.  Shower with CHG Soap the night before surgery and the  morning of Surgery.  2.  If you choose to wash your hair, wash your hair first as usual with your  normal  shampoo.  3.  After you shampoo, rinse your hair and body thoroughly to remove the  shampoo.                           4.  Use CHG as you would any other liquid soap.  You can apply chg directly  to the skin and wash                       Gently with a scrungie or clean washcloth.  5.  Apply the CHG Soap to your body ONLY FROM THE NECK DOWN.   Do not use on face/ open                           Wound or open sores. Avoid contact with eyes, ears mouth and genitals (private parts).                       Wash face,  Genitals (private parts) with your normal soap.             6.  Wash thoroughly, paying special attention to the area where your surgery  will be performed.  7.  Thoroughly rinse your body with warm water from the neck down.  8.  DO NOT shower/wash with your normal soap after using and rinsing off  the CHG Soap.                9.  Pat yourself dry with a clean towel.            10.  Wear clean pajamas.            11.  Place clean sheets on your bed the night  of your first shower and do not  sleep with pets. Day of Surgery : Do not apply any lotions/deodorants the morning of surgery.  Please wear clean clothes  to the hospital/surgery center.  FAILURE TO FOLLOW THESE INSTRUCTIONS MAY RESULT IN THE CANCELLATION OF YOUR SURGERY PATIENT SIGNATURE_________________________________  NURSE SIGNATURE__________________________________  ________________________________________________________________________

## 2020-11-30 ENCOUNTER — Encounter (HOSPITAL_COMMUNITY): Payer: Self-pay

## 2020-11-30 ENCOUNTER — Other Ambulatory Visit: Payer: Self-pay

## 2020-11-30 ENCOUNTER — Encounter: Payer: Self-pay | Admitting: Family Medicine

## 2020-11-30 ENCOUNTER — Telehealth (INDEPENDENT_AMBULATORY_CARE_PROVIDER_SITE_OTHER): Payer: Managed Care, Other (non HMO) | Admitting: Family Medicine

## 2020-11-30 ENCOUNTER — Encounter (HOSPITAL_COMMUNITY)
Admission: RE | Admit: 2020-11-30 | Discharge: 2020-11-30 | Disposition: A | Discharge status: Home / Self Care | Payer: Managed Care, Other (non HMO) | Source: Ambulatory Visit | Attending: Surgery | Admitting: Surgery

## 2020-11-30 DIAGNOSIS — Z01818 Encounter for other preprocedural examination: Secondary | ICD-10-CM

## 2020-11-30 HISTORY — DX: Angina pectoris, unspecified: I20.9

## 2020-11-30 HISTORY — DX: Cardiac murmur, unspecified: R01.1

## 2020-11-30 LAB — CBC WITH DIFFERENTIAL/PLATELET
Abs Immature Granulocytes: 0.07 10*3/uL (ref 0.00–0.07)
Basophils Absolute: 0 10*3/uL (ref 0.0–0.1)
Basophils Relative: 0 %
Eosinophils Absolute: 0 10*3/uL (ref 0.0–0.5)
Eosinophils Relative: 0 %
HCT: 41.8 % (ref 36.0–46.0)
Hemoglobin: 13.4 g/dL (ref 12.0–15.0)
Immature Granulocytes: 1 %
Lymphocytes Relative: 11 %
Lymphs Abs: 1 10*3/uL (ref 0.7–4.0)
MCH: 26.7 pg (ref 26.0–34.0)
MCHC: 32.1 g/dL (ref 30.0–36.0)
MCV: 83.3 fL (ref 80.0–100.0)
Monocytes Absolute: 0.3 10*3/uL (ref 0.1–1.0)
Monocytes Relative: 3 %
Neutro Abs: 7.6 10*3/uL (ref 1.7–7.7)
Neutrophils Relative %: 85 %
Platelets: 414 10*3/uL — ABNORMAL HIGH (ref 150–400)
RBC: 5.02 MIL/uL (ref 3.87–5.11)
RDW: 13.9 % (ref 11.5–15.5)
WBC: 9 10*3/uL (ref 4.0–10.5)
nRBC: 0 % (ref 0.0–0.2)

## 2020-11-30 LAB — COMPREHENSIVE METABOLIC PANEL
ALT: 33 U/L (ref 0–44)
AST: 21 U/L (ref 15–41)
Albumin: 4.3 g/dL (ref 3.5–5.0)
Alkaline Phosphatase: 59 U/L (ref 38–126)
Anion gap: 9 (ref 5–15)
BUN: 15 mg/dL (ref 6–20)
CO2: 22 mmol/L (ref 22–32)
Calcium: 9.4 mg/dL (ref 8.9–10.3)
Chloride: 102 mmol/L (ref 98–111)
Creatinine, Ser: 0.87 mg/dL (ref 0.44–1.00)
GFR, Estimated: >60 mL/min (ref >60)
Glucose, Bld: 172 mg/dL — ABNORMAL HIGH (ref 70–99)
Potassium: 4.2 mmol/L (ref 3.5–5.1)
Sodium: 133 mmol/L — ABNORMAL LOW (ref 135–145)
Total Bilirubin: 0.3 mg/dL (ref 0.3–1.2)
Total Protein: 8.1 g/dL (ref 6.5–8.1)

## 2020-11-30 LAB — GLUCOSE, CAPILLARY: Glucose-Capillary: 190 mg/dL — ABNORMAL HIGH (ref 70–99)

## 2020-11-30 NOTE — Progress Notes (Signed)
Subjective:   Chief Complaint  Patient presents with   Pre-op Exam    Maria Johnson  is here for a Pre-operative physical at the request of Dr. Fredricka Bonine.   She  is having gastric sleeve surgery on 12/08/20 for morbid obesity. Due to COVID-19 pandemic, we are interacting via web portal for an electronic face-to-face visit. I verified patient's ID using 2 identifiers. Patient agreed to proceed with visit via this method. Patient is at home, I am at office. Patient and I are present for visit.   Personal or family hx of adverse outcome to anesthesia? No  Chipped, cracked, missing, or loose teeth? No  Decreased ROM of neck? No  Able to walk up 2 flights of stairs without becoming significantly short of breath or having chest pain? Yes   Revised Goldman Criteria: High Risk Surgery (intraperitoneal, intrathoracic, aortic): No  Ischemic heart disease (Prior MI, +excercise stress test, angina, nitrate use, Qwave): No  History of heart failure: No  History of cerebrovascular disease: No  History of diabetes: Yes  Insulin therapy for DM: No  Preoperative Cr >2.0: No   Revised Goldman Criteria - risk for major cardiac death No risk factors -- 0.4 percent One risk factor -- 1.0 percent  Two risk factors -- 2.4 percent  Three or more risk factors -- 5.4 percent   Patient Active Problem List   Diagnosis Date Noted   Elevated serum creatinine 06/17/2020   Other fatigue 06/03/2020   SOBOE (shortness of breath on exertion) 06/03/2020   Diabetes mellitus (HCC) 06/03/2020   Hyperlipidemia associated with type 2 diabetes mellitus (HCC) 06/03/2020   GAD (generalized anxiety disorder), with emotional eating 06/03/2020   At risk for heart disease 06/03/2020   Carpal tunnel syndrome on both sides    Chest pain of uncertain etiology 01/22/2020   OSA (obstructive sleep apnea) 01/22/2020   Morbid obesity (HCC) 01/22/2020   Insulin-requiring or dependent type II diabetes mellitus (HCC) 01/22/2020    Metabolic syndrome 01/22/2020   Sleep apnea    Sickle cell trait (HCC)    Depression    Chicken pox    Bronchitis    Hyperlipidemia    Mixed hyperlipidemia 08/23/2019   Hirsutism 08/07/2019   Sprain of medial collateral ligament of left knee 10/30/2018   Bilateral carpal tunnel syndrome 04/04/2018   Atopic dermatitis 04/04/2018   OSA on CPAP 09/25/2017   Well adult exam 09/25/2017   Vitamin D deficiency 08/24/2017   Insomnia 08/24/2017   Osteochondritis dissecans of ankle, 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   Pes planus 10/22/2015   Anxiety 01/08/2014   Chest pain 01/08/2014   Generalized anxiety disorder 01/08/2014   Type 2 diabetes mellitus with hyperglycemia (HCC) 08/12/2013   Acne 02/16/2011   Diabetes in pregnancy 12/10/2005   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  Pes planus 10/22/2015   Pneumonia    Sickle cell trait (HCC)    Sleep apnea    has cpap   SOBOE (shortness of breath on exertion)    Sprain of medial collateral ligament of left knee 10/30/2018   Type 2 diabetes mellitus with hyperglycemia (HCC) 08/12/2013   Vitamin D deficiency 08/24/2017   Well adult exam 09/25/2017    Past Surgical History:  Procedure Laterality Date   ANKLE SURGERY  05/2003   torn cartilage  repair; left   ANTERIOR LUMBAR FUSION  01/06/2011   Procedure: ANTERIOR LUMBAR FUSION 1 LEVEL;  Surgeon: Dorian Heckle, MD;  Location: [facility];  Service:  Neurosurgery;  Laterality: N/A;  Lumbar five-Sacral one  Anterior Lumbar Interbody Fusion with Instrumentation    BACK SURGERY     Laminectomy Sept 14, 2011,   DIAGNOSTIC LAPAROSCOPY  01/2002   removed scar tissue   LAMINECTOMY AND MICRODISCECTOMY LUMBAR SPINE  10/2009   L4, L5, S1   LUMBAR FUSION  01/06/2011   L5-S1   TOE SURGERY  07/05/2020   TONSILLECTOMY  03/2005   VAGINAL HYSTERECTOMY  08/2007   partial; AUB    Current Outpatient Medications  Medication Sig Dispense Refill   ALPRAZolam (XANAX) 0.5 MG tablet TAKE 1 TABLET(0.5 MG) BY MOUTH DAILY AS NEEDED FOR ANXIETY 30 tablet 2   atorvastatin (LIPITOR) 40 MG tablet TAKE 1 TABLET(40 MG) BY MOUTH DAILY 90 tablet 1   metFORMIN (GLUCOPHAGE-XR) 750 MG 24 hr tablet Take 1,500 mg by mouth in the morning and at bedtime.     spironolactone (ALDACTONE) 50 MG tablet TAKE 1 TABLET(50 MG) BY MOUTH TWICE DAILY 60 tablet 2   Vitamin D, Ergocalciferol, (DRISDOL) 1.25 MG (50000 UNIT) CAPS capsule 1 po q Wed and 1 po q Sun (Patient taking differently: Take 50,000 Units by mouth 2 (two) times a week. 1 po q Wed and 1 po q Sun) 8 capsule 0   No Known Allergies  Family History  Problem Relation Age of Onset   Miscarriages / Stillbirths Mother    Hypertension Mother    Alcohol abuse Father    Depression Father    Drug abuse Father    Hyperlipidemia Father    Stroke Father    Anxiety disorder Father    Sleep apnea Father    Alcoholism Father    Depression Daughter    Diabetes Daughter    Hypertension Daughter    Miscarriages / [place] Daughter    Birth defects Son    Diabetes Maternal Grandmother    Heart attack Maternal Grandmother    Hypertension Maternal Grandmother    Stroke Maternal Grandmother    Diabetes Maternal Grandfather    Hypertension Maternal Grandfather    Diabetes Paternal Grandmother    Lupus Paternal Grandmother      Review of Systems:  Constitutional:  no fevers Eye:  no recent significant change in  vision Ear:  no hearing loss Nose/Mouth/Throat:  No dental complaints Neck/Thyroid:  no lumps or masses Pulmonary:  No shortness of breath Cardiovascular:  no chest pain Gastrointestinal:  no abdominal pain GU:  negative for dysuria Musculoskeletal/Extremities:  no pain Skin/Integumentary ROS:  no painful rashes Neurologic:  no HA   Objective:   No conversational dyspnea Age appropriate judgment and insight Nml affect and mood  Assessment:   Preop examination   Plan:   Orders as above. Labs ordered through other office, Cr <1. Corrected sodium 135. Na 2 d ago was  136.  The pt is medically optimized for her procedure. No meds to adjust pre/post op at this time. Will have her f/u at end of Nov to discuss DM management moving forward. No NSAIDs within 5 d of procedure.   The patient voiced understanding and agreement to the plan.  Jilda Roche [place], DO 11/30/20  12:00 PM

## 2020-11-30 NOTE — Progress Notes (Signed)
Anesthesia Chart Review   Case: [id number] Date/Time: 12/08/20 1045   Procedures:      LAPAROSCOPIC GASTRIC SLEEVE RESECTION     UPPER GI ENDOSCOPY   Anesthesia type: General   Pre-op diagnosis: MORBID OBESITY   Location: [facility] ROOM 01 / [facility]   Surgeons: Berna Bue, MD       DISCUSSION:40 y.o. never smoker with h/o sleep apnea, DM II (A1C 6.7), morbid obesity scheduled for above procedure 12/08/2020 with Dr. Phylliss Blakes.   Pt seen by PCP 11/30/2020. Per OV note, "The pt is medically optimized for her procedure. No meds to adjust pre/post op at this time. Will have her f/u at end of Nov to discuss DM management moving forward. No NSAIDs within 5 d of procedure."  Anticipate pt can proceed with planned procedure barring acute status change.   VS: BP (!) 136/97   Pulse 76   Temp 36.4 C (Oral)   Ht 5\' 9"  (1.753 m)   Wt 106.1 kg   LMP 08/09/2007   SpO2 97%   BMI 34.56 kg/m   PROVIDERS: 10/10/2007, DO is PCP    LABS: Labs reviewed: Acceptable for surgery. (all labs ordered are listed, but only abnormal results are displayed)  Labs Reviewed  CBC WITH DIFFERENTIAL/PLATELET - Abnormal; Notable for the following components:      Result Value   Platelets 414 (*)    All other components within normal limits  COMPREHENSIVE METABOLIC PANEL - Abnormal; Notable for the following components:   Sodium 133 (*)    Glucose, Bld 172 (*)    All other components within normal limits  GLUCOSE, CAPILLARY - Abnormal; Notable for the following components:   Glucose-Capillary 190 (*)    All other components within normal limits  TYPE AND SCREEN     IMAGES:   EKG: 01/21/2020 Rate 86 bpm  NSR  CV: Echo 02/27/2020 1. Left ventricular ejection fraction, by estimation, is 60 to 65%. The  left ventricle has normal function. The left ventricle has no regional  wall motion abnormalities. Left ventricular diastolic parameters were  normal.   2. Right ventricular  systolic function is normal. The right ventricular  size is normal.   3. The mitral valve is normal in structure. No evidence of mitral valve  regurgitation. No evidence of mitral stenosis.   4. The aortic valve is normal in structure. Aortic valve regurgitation is  not visualized. No aortic stenosis is present.   5. The inferior vena cava is normal in size with greater than 50%  respiratory variability, suggesting right atrial pressure of 3 mmHg. 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   Pes planus 10/22/2015   Pneumonia    Sickle cell trait (HCC)    Sleep apnea    has cpap  SOBOE (shortness of breath on exertion)    Sprain of medial collateral ligament of left knee 10/30/2018   Type 2 diabetes mellitus with hyperglycemia (HCC) 08/12/2013   Vitamin D deficiency 08/24/2017   Well adult exam 09/25/2017    Past Surgical History:  Procedure Laterality Date   ANKLE SURGERY  05/2003   torn cartilage  repair; left   ANTERIOR LUMBAR FUSION  01/06/2011   Procedure: ANTERIOR LUMBAR FUSION 1 LEVEL;  Surgeon: Dorian Heckle, MD;  Location: [facility];  Service: Neurosurgery;  Laterality: N/A;  Lumbar five-Sacral one  Anterior Lumbar Interbody Fusion with Instrumentation    BACK SURGERY     Laminectomy Sept 14, 2011,   DIAGNOSTIC LAPAROSCOPY  01/2002   removed scar tissue   LAMINECTOMY AND MICRODISCECTOMY LUMBAR SPINE  10/2009   L4, L5, S1    LUMBAR FUSION  01/06/2011   L5-S1   TOE SURGERY  07/05/2020   TONSILLECTOMY  03/2005   VAGINAL HYSTERECTOMY  08/2007   partial; AUB    MEDICATIONS:  ALPRAZolam (XANAX) 0.5 MG tablet   atorvastatin (LIPITOR) 40 MG tablet   metFORMIN (GLUCOPHAGE-XR) 750 MG 24 hr tablet   spironolactone (ALDACTONE) 50 MG tablet   Vitamin D, Ergocalciferol, (DRISDOL) 1.25 MG (50000 UNIT) CAPS capsule   No current facility-administered medications for this encounter.   Jodell Cipro Ward, PA-C [facility]Surgical Testing [phone]

## 2020-11-30 NOTE — Progress Notes (Signed)
COVID Vaccine Completed: Yes Date COVID Vaccine completed: 01/17/20 COVID vaccine manufacturer: Pfizer x 1 [employer] x 1 COVID Test: 12/04/20 PCP -DO: Radene Gunning  Cardiologist - DO: Thomasene Ripple. LOV: 04/23/20  Chest x-ray - 09/28/20 EKG - 01/21/20: EPIC Stress Test -  ECHO - 02/27/20 Cardiac Cath -  Pacemaker/ICD device last checked: A1C: 6.9: 11/28/20: EPIC Sleep Study - Yes CPAP - Yes  Fasting Blood Sugar - 100's Checks Blood Sugar ___2__ times a week  Blood Thinner Instructions: Aspirin Instructions: Last Dose:  Anesthesia review: Hx: DIA,OSA(CPAP),Heart murmur,Chest pain  Patient denies shortness of breath, fever, cough and chest pain at PAT appointment   Patient verbalized understanding of instructions that were given to them at the PAT appointment. Patient was also instructed that they will need to review over the PAT instructions again at home before surgery.

## 2020-12-04 ENCOUNTER — Other Ambulatory Visit: Payer: Self-pay | Admitting: Surgery

## 2020-12-04 LAB — SARS CORONAVIRUS 2 (TAT 6-24 HRS): SARS Coronavirus 2: NEGATIVE

## 2020-12-07 NOTE — Progress Notes (Signed)
Pt aware to arrive at [facility] admitting at 12:30 pm for scheduled surgical procedure on Tuesday 12/08/2020. No food after midnight; clear liquids from midnight till 11:30 am consuming entire pre surgery drink by 11:30 am then nothing by mouth.

## 2020-12-08 ENCOUNTER — Inpatient Hospital Stay (HOSPITAL_COMMUNITY)
Admission: RE | Admit: 2020-12-08 | Discharge: 2020-12-09 | DRG: 621 | Disposition: A | Discharge status: Home / Self Care | Payer: Managed Care, Other (non HMO) | Attending: Surgery | Admitting: Surgery

## 2020-12-08 ENCOUNTER — Encounter (HOSPITAL_COMMUNITY)
Admission: RE | Disposition: A | Discharge status: Home / Self Care | Payer: Self-pay | Source: Home / Self Care | Attending: Surgery

## 2020-12-08 ENCOUNTER — Encounter (HOSPITAL_COMMUNITY): Payer: Self-pay | Admitting: Surgery

## 2020-12-08 ENCOUNTER — Inpatient Hospital Stay (HOSPITAL_COMMUNITY): Payer: Managed Care, Other (non HMO) | Admitting: Anesthesiology

## 2020-12-08 ENCOUNTER — Inpatient Hospital Stay (HOSPITAL_COMMUNITY): Payer: Managed Care, Other (non HMO) | Admitting: Physician Assistant

## 2020-12-08 DIAGNOSIS — Z79899 Other long term (current) drug therapy: Secondary | ICD-10-CM

## 2020-12-08 DIAGNOSIS — Z8632 Personal history of gestational diabetes: Secondary | ICD-10-CM | POA: Diagnosis not present

## 2020-12-08 DIAGNOSIS — K760 Fatty (change of) liver, not elsewhere classified: Secondary | ICD-10-CM | POA: Diagnosis present

## 2020-12-08 DIAGNOSIS — Z7984 Long term (current) use of oral hypoglycemic drugs: Secondary | ICD-10-CM

## 2020-12-08 DIAGNOSIS — D573 Sickle-cell trait: Secondary | ICD-10-CM | POA: Diagnosis present

## 2020-12-08 DIAGNOSIS — Z6835 Body mass index (BMI) 35.0-35.9, adult: Secondary | ICD-10-CM | POA: Diagnosis not present

## 2020-12-08 DIAGNOSIS — E785 Hyperlipidemia, unspecified: Secondary | ICD-10-CM | POA: Diagnosis present

## 2020-12-08 DIAGNOSIS — E119 Type 2 diabetes mellitus without complications: Secondary | ICD-10-CM | POA: Diagnosis present

## 2020-12-08 DIAGNOSIS — G4730 Sleep apnea, unspecified: Secondary | ICD-10-CM | POA: Diagnosis present

## 2020-12-08 DIAGNOSIS — Z20822 Contact with and (suspected) exposure to covid-19: Secondary | ICD-10-CM | POA: Diagnosis present

## 2020-12-08 DIAGNOSIS — F419 Anxiety disorder, unspecified: Secondary | ICD-10-CM | POA: Diagnosis present

## 2020-12-08 HISTORY — PX: LAPAROSCOPIC GASTRIC SLEEVE RESECTION: SHX5895

## 2020-12-08 HISTORY — PX: UPPER GI ENDOSCOPY: SHX6162

## 2020-12-08 LAB — TYPE AND SCREEN
ABO/RH(D): O POS
Antibody Screen: NEGATIVE

## 2020-12-08 LAB — GLUCOSE, CAPILLARY
Glucose-Capillary: 111 mg/dL — ABNORMAL HIGH (ref 70–99)
Glucose-Capillary: 179 mg/dL — ABNORMAL HIGH (ref 70–99)

## 2020-12-08 SURGERY — GASTRECTOMY, SLEEVE, LAPAROSCOPIC
Anesthesia: General

## 2020-12-08 MED ORDER — DEXAMETHASONE SODIUM PHOSPHATE 10 MG/ML IJ SOLN
INTRAMUSCULAR | Status: DC | PRN
  Administered 2020-12-08: 5 mg via INTRAVENOUS

## 2020-12-08 MED ORDER — CHLORHEXIDINE GLUCONATE 0.12 % MT SOLN
15.0000 mL | Freq: Once | OROMUCOSAL | Status: AC
  Administered 2020-12-08: 15 mL via OROMUCOSAL

## 2020-12-08 MED ORDER — ONDANSETRON HCL 4 MG/2ML IJ SOLN
INTRAMUSCULAR | Status: AC
  Filled 2020-12-08: qty 2

## 2020-12-08 MED ORDER — DOCUSATE SODIUM 100 MG PO CAPS
100.0000 mg | ORAL_CAPSULE | Freq: Two times a day (BID) | ORAL | Status: DC
  Administered 2020-12-09: 100 mg via ORAL
  Filled 2020-12-08: qty 1

## 2020-12-08 MED ORDER — BUPIVACAINE LIPOSOME 1.3 % IJ SUSP
INTRAMUSCULAR | Status: AC
  Filled 2020-12-08: qty 20

## 2020-12-08 MED ORDER — KETAMINE HCL 10 MG/ML IJ SOLN
INTRAMUSCULAR | Status: DC | PRN
  Administered 2020-12-08: 30 mg via INTRAVENOUS

## 2020-12-08 MED ORDER — OXYCODONE HCL 5 MG/5ML PO SOLN: 5.0000 mg | Freq: Three times a day (TID) | ORAL | Status: DC | PRN

## 2020-12-08 MED ORDER — GABAPENTIN 300 MG PO CAPS
300.0000 mg | ORAL_CAPSULE | ORAL | Status: AC
  Administered 2020-12-08: 300 mg via ORAL
  Filled 2020-12-08: qty 1

## 2020-12-08 MED ORDER — BUPIVACAINE LIPOSOME 1.3 % IJ SUSP: 20.0000 mL | Freq: Once | INTRAMUSCULAR | Status: DC

## 2020-12-08 MED ORDER — PHENYLEPHRINE 40 MCG/ML (10ML) SYRINGE FOR IV PUSH (FOR BLOOD PRESSURE SUPPORT)
PREFILLED_SYRINGE | INTRAVENOUS | Status: AC
  Filled 2020-12-08: qty 10

## 2020-12-08 MED ORDER — FENTANYL CITRATE (PF) 250 MCG/5ML IJ SOLN
INTRAMUSCULAR | Status: AC
  Filled 2020-12-08: qty 5

## 2020-12-08 MED ORDER — HYDROMORPHONE HCL 1 MG/ML IJ SOLN: 0.5000 mg | INTRAMUSCULAR | Status: DC | PRN

## 2020-12-08 MED ORDER — FENTANYL CITRATE (PF) 250 MCG/5ML IJ SOLN
INTRAMUSCULAR | Status: DC | PRN
  Administered 2020-12-08: 100 ug via INTRAVENOUS
  Administered 2020-12-08: 50 ug via INTRAVENOUS

## 2020-12-08 MED ORDER — CHLORHEXIDINE GLUCONATE 4 % EX LIQD: 60.0000 mL | Freq: Once | CUTANEOUS | Status: DC

## 2020-12-08 MED ORDER — SUGAMMADEX SODIUM 500 MG/5ML IV SOLN
INTRAVENOUS | Status: AC
  Filled 2020-12-08: qty 5

## 2020-12-08 MED ORDER — SUGAMMADEX SODIUM 500 MG/5ML IV SOLN
INTRAVENOUS | Status: DC | PRN
  Administered 2020-12-08: 220 mg via INTRAVENOUS

## 2020-12-08 MED ORDER — DEXAMETHASONE SODIUM PHOSPHATE 10 MG/ML IJ SOLN
INTRAMUSCULAR | Status: AC
  Filled 2020-12-08: qty 1

## 2020-12-08 MED ORDER — MIDAZOLAM HCL 2 MG/2ML IJ SOLN
INTRAMUSCULAR | Status: DC | PRN
Start: 2020-12-08 — End: 2020-12-08
  Administered 2020-12-08: 2 mg via INTRAVENOUS

## 2020-12-08 MED ORDER — SODIUM CHLORIDE 0.9 % IR SOLN
Status: DC | PRN
  Administered 2020-12-08: 1000 mL

## 2020-12-08 MED ORDER — LACTATED RINGERS IR SOLN
Status: DC | PRN
  Administered 2020-12-08: 3000 mL

## 2020-12-08 MED ORDER — KETAMINE HCL 10 MG/ML IJ SOLN
INTRAMUSCULAR | Status: AC
  Filled 2020-12-08: qty 1

## 2020-12-08 MED ORDER — ENSURE MAX PROTEIN PO LIQD
2.0000 [oz_av] | ORAL | Status: DC
  Administered 2020-12-09 (×5): 2 [oz_av] via ORAL

## 2020-12-08 MED ORDER — LACTATED RINGERS IV SOLN: INTRAVENOUS | Status: DC

## 2020-12-08 MED ORDER — ROCURONIUM BROMIDE 10 MG/ML (PF) SYRINGE
PREFILLED_SYRINGE | INTRAVENOUS | Status: AC
  Filled 2020-12-08: qty 10

## 2020-12-08 MED ORDER — METOCLOPRAMIDE HCL 5 MG/ML IJ SOLN
10.0000 mg | Freq: Four times a day (QID) | INTRAMUSCULAR | Status: DC
  Administered 2020-12-08 – 2020-12-09 (×4): 10 mg via INTRAVENOUS
  Filled 2020-12-08 (×4): qty 2

## 2020-12-08 MED ORDER — SIMETHICONE 80 MG PO CHEW: 80.0000 mg | CHEWABLE_TABLET | Freq: Four times a day (QID) | ORAL | Status: DC | PRN

## 2020-12-08 MED ORDER — EPHEDRINE 5 MG/ML INJ
INTRAVENOUS | Status: AC
  Filled 2020-12-08: qty 5

## 2020-12-08 MED ORDER — TRAMADOL HCL 50 MG PO TABS
50.0000 mg | ORAL_TABLET | Freq: Four times a day (QID) | ORAL | Status: DC | PRN
  Administered 2020-12-09: 50 mg via ORAL
  Filled 2020-12-08: qty 1

## 2020-12-08 MED ORDER — ACETAMINOPHEN 500 MG PO TABS
1000.0000 mg | ORAL_TABLET | ORAL | Status: AC
  Administered 2020-12-08: 1000 mg via ORAL
  Filled 2020-12-08: qty 2

## 2020-12-08 MED ORDER — PANTOPRAZOLE SODIUM 40 MG IV SOLR
40.0000 mg | Freq: Every day | INTRAVENOUS | Status: DC
  Administered 2020-12-08: 40 mg via INTRAVENOUS
  Filled 2020-12-08: qty 40

## 2020-12-08 MED ORDER — ONDANSETRON HCL 4 MG/2ML IJ SOLN: 4.0000 mg | INTRAMUSCULAR | Status: DC | PRN

## 2020-12-08 MED ORDER — LIDOCAINE 2% (20 MG/ML) 5 ML SYRINGE
INTRAMUSCULAR | Status: DC | PRN
  Administered 2020-12-08: 80 mg via INTRAVENOUS

## 2020-12-08 MED ORDER — PROPOFOL 10 MG/ML IV BOLUS
INTRAVENOUS | Status: DC | PRN
  Administered 2020-12-08: 50 mg via INTRAVENOUS
  Administered 2020-12-08: 150 mg via INTRAVENOUS

## 2020-12-08 MED ORDER — LIDOCAINE HCL (PF) 2 % IJ SOLN
INTRAMUSCULAR | Status: AC
  Filled 2020-12-08: qty 5

## 2020-12-08 MED ORDER — FENTANYL CITRATE PF 50 MCG/ML IJ SOSY: 25.0000 ug | PREFILLED_SYRINGE | INTRAMUSCULAR | Status: DC | PRN

## 2020-12-08 MED ORDER — BUPIVACAINE LIPOSOME 1.3 % IJ SUSP
INTRAMUSCULAR | Status: DC | PRN
  Administered 2020-12-08: 20 mL

## 2020-12-08 MED ORDER — PHENYLEPHRINE 40 MCG/ML (10ML) SYRINGE FOR IV PUSH (FOR BLOOD PRESSURE SUPPORT)
PREFILLED_SYRINGE | INTRAVENOUS | Status: DC | PRN
Start: 2020-12-08 — End: 2020-12-08
  Administered 2020-12-08: 80 ug via INTRAVENOUS
  Administered 2020-12-08: 120 ug via INTRAVENOUS

## 2020-12-08 MED ORDER — SODIUM CHLORIDE 0.9 % IV SOLN: INTRAVENOUS | Status: DC

## 2020-12-08 MED ORDER — GABAPENTIN 100 MG PO CAPS
200.0000 mg | ORAL_CAPSULE | Freq: Two times a day (BID) | ORAL | Status: DC
  Administered 2020-12-09: 200 mg via ORAL
  Filled 2020-12-08: qty 2

## 2020-12-08 MED ORDER — SCOPOLAMINE 1 MG/3DAYS TD PT72
1.0000 | MEDICATED_PATCH | TRANSDERMAL | Status: DC
  Administered 2020-12-08: 1.5 mg via TRANSDERMAL
  Filled 2020-12-08: qty 1

## 2020-12-08 MED ORDER — MIDAZOLAM HCL 2 MG/2ML IJ SOLN
INTRAMUSCULAR | Status: AC
  Filled 2020-12-08: qty 2

## 2020-12-08 MED ORDER — METHOCARBAMOL 1000 MG/10ML IJ SOLN
500.0000 mg | Freq: Four times a day (QID) | INTRAVENOUS | Status: DC | PRN
  Administered 2020-12-08: 500 mg via INTRAVENOUS
  Filled 2020-12-08: qty 500
  Filled 2020-12-08: qty 5

## 2020-12-08 MED ORDER — ROCURONIUM BROMIDE 10 MG/ML (PF) SYRINGE
PREFILLED_SYRINGE | INTRAVENOUS | Status: DC | PRN
  Administered 2020-12-08: 100 mg via INTRAVENOUS

## 2020-12-08 MED ORDER — HYDRALAZINE HCL 20 MG/ML IJ SOLN: 10.0000 mg | INTRAMUSCULAR | Status: DC | PRN

## 2020-12-08 MED ORDER — ONDANSETRON HCL 4 MG/2ML IJ SOLN
INTRAMUSCULAR | Status: DC | PRN
  Administered 2020-12-08: 4 mg via INTRAVENOUS

## 2020-12-08 MED ORDER — BUPIVACAINE-EPINEPHRINE (PF) 0.25% -1:200000 IJ SOLN
INTRAMUSCULAR | Status: AC
  Filled 2020-12-08: qty 30

## 2020-12-08 MED ORDER — HEPARIN SODIUM (PORCINE) 5000 UNIT/ML IJ SOLN
5000.0000 [IU] | INTRAMUSCULAR | Status: AC
  Administered 2020-12-08: 5000 [IU] via SUBCUTANEOUS
  Filled 2020-12-08: qty 1

## 2020-12-08 MED ORDER — METOPROLOL TARTRATE 5 MG/5ML IV SOLN: 5.0000 mg | Freq: Four times a day (QID) | INTRAVENOUS | Status: DC | PRN

## 2020-12-08 MED ORDER — ACETAMINOPHEN 500 MG PO TABS
1000.0000 mg | ORAL_TABLET | Freq: Three times a day (TID) | ORAL | Status: DC
  Administered 2020-12-09 (×2): 1000 mg via ORAL
  Filled 2020-12-08 (×2): qty 2

## 2020-12-08 MED ORDER — BUPIVACAINE-EPINEPHRINE 0.25% -1:200000 IJ SOLN
INTRAMUSCULAR | Status: DC | PRN
  Administered 2020-12-08: 30 mL

## 2020-12-08 MED ORDER — APREPITANT 40 MG PO CAPS
40.0000 mg | ORAL_CAPSULE | ORAL | Status: AC
  Administered 2020-12-08: 40 mg via ORAL
  Filled 2020-12-08: qty 1

## 2020-12-08 MED ORDER — SODIUM CHLORIDE 0.9 % IV SOLN
2.0000 g | INTRAVENOUS | Status: AC
  Administered 2020-12-08: 2 g via INTRAVENOUS
  Filled 2020-12-08: qty 2

## 2020-12-08 MED ORDER — ORAL CARE MOUTH RINSE: 15.0000 mL | Freq: Once | OROMUCOSAL | Status: AC

## 2020-12-08 MED ORDER — EPHEDRINE SULFATE-NACL 50-0.9 MG/10ML-% IV SOSY
PREFILLED_SYRINGE | INTRAVENOUS | Status: DC | PRN
  Administered 2020-12-08: 10 mg via INTRAVENOUS

## 2020-12-08 MED ORDER — ACETAMINOPHEN 160 MG/5ML PO SOLN: 1000.0000 mg | Freq: Three times a day (TID) | ORAL | Status: DC

## 2020-12-08 SURGICAL SUPPLY — 70 items
APPLIER CLIP ROT 10 11.4 M/L (STAPLE) ×3
APPLIER CLIP ROT 13.4 12 LRG (CLIP)
BAG COUNTER SPONGE SURGICOUNT (BAG) IMPLANT
BAG LAPAROSCOPIC 12 15 PORT 16 (BASKET) IMPLANT
BAG RETRIEVAL 12/15 (BASKET)
BAG RETRIEVAL 12/15MM (BASKET)
BAG SURGICOUNT SPONGE COUNTING (BAG)
BENZOIN TINCTURE PRP APPL 2/3 (GAUZE/BANDAGES/DRESSINGS) ×3 IMPLANT
BLADE SURG SZ11 CARB STEEL (BLADE) ×3 IMPLANT
BNDG ADH 1X3 SHEER STRL LF (GAUZE/BANDAGES/DRESSINGS) ×18 IMPLANT
CABLE HIGH FREQUENCY MONO STRZ (ELECTRODE) ×3 IMPLANT
CHLORAPREP W/TINT 26 (MISCELLANEOUS) ×6 IMPLANT
CLIP APPLIE ROT 10 11.4 M/L (STAPLE) ×1 IMPLANT
CLIP APPLIE ROT 13.4 12 LRG (CLIP) IMPLANT
CLOSURE WOUND 1/2 X4 (GAUZE/BANDAGES/DRESSINGS) ×1
COVER SURGICAL LIGHT HANDLE (MISCELLANEOUS) ×3 IMPLANT
DECANTER SPIKE VIAL GLASS SM (MISCELLANEOUS) ×3 IMPLANT
DEVICE SUT QUICK LOAD TK 5 (STAPLE) IMPLANT
DEVICE SUT TI-KNOT TK 5X26 (MISCELLANEOUS) IMPLANT
DEVICE TI KNOT TK5 (MISCELLANEOUS)
DRAPE UTILITY XL STRL (DRAPES) ×6 IMPLANT
ELECT REM PT RETURN 15FT ADLT (MISCELLANEOUS) ×3 IMPLANT
GAUZE SPONGE 4X4 12PLY STRL (GAUZE/BANDAGES/DRESSINGS) IMPLANT
GLOVE SURG ENC MOIS LTX SZ6 (GLOVE) ×3 IMPLANT
GLOVE SURG MICRO LTX SZ6 (GLOVE) ×3 IMPLANT
GLOVE SURG UNDER LTX SZ6.5 (GLOVE) ×3 IMPLANT
GOWN STRL REUS W/TWL LRG LVL3 (GOWN DISPOSABLE) ×3 IMPLANT
GOWN STRL REUS W/TWL XL LVL3 (GOWN DISPOSABLE) ×6 IMPLANT
GRASPER SUT TROCAR 14GX15 (MISCELLANEOUS) ×3 IMPLANT
IRRIG SUCT STRYKERFLOW 2 WTIP (MISCELLANEOUS) ×3
IRRIGATION SUCT STRKRFLW 2 WTP (MISCELLANEOUS) ×1 IMPLANT
KIT BASIN OR (CUSTOM PROCEDURE TRAY) ×3 IMPLANT
KIT TURNOVER KIT A (KITS) IMPLANT
MARKER SKIN DUAL TIP RULER LAB (MISCELLANEOUS) ×3 IMPLANT
MAT PREVALON FULL STRYKER (MISCELLANEOUS) ×3 IMPLANT
NEEDLE SPNL 22GX3.5 QUINCKE BK (NEEDLE) ×3 IMPLANT
PACK UNIVERSAL I (CUSTOM PROCEDURE TRAY) ×3 IMPLANT
QUICK LOAD TK 5 (STAPLE)
RELOAD ENDO STITCH (ENDOMECHANICALS) IMPLANT
RELOAD STAPLER BLUE 60MM (STAPLE) ×6 IMPLANT
RELOAD STAPLER GOLD 60MM (STAPLE) ×1 IMPLANT
RELOAD STAPLER GREEN 60MM (STAPLE) ×1 IMPLANT
SCISSORS LAP 5X45 EPIX DISP (ENDOMECHANICALS) ×3 IMPLANT
SET TUBE SMOKE EVAC HIGH FLOW (TUBING) ×3 IMPLANT
SHEARS HARMONIC ACE PLUS 45CM (MISCELLANEOUS) ×3 IMPLANT
SLEEVE ADV FIXATION 5X100MM (TROCAR) ×6 IMPLANT
SLEEVE GASTRECTOMY 40FR VISIGI (MISCELLANEOUS) ×3 IMPLANT
SOL ANTI FOG 6CC (MISCELLANEOUS) ×1 IMPLANT
SOLUTION ANTI FOG 6CC (MISCELLANEOUS) ×2
SPONGE T-LAP 18X18 ~~LOC~~+RFID (SPONGE) ×3 IMPLANT
STAPLE LINE REINFORCEMENT LAP (STAPLE) ×15 IMPLANT
STAPLER ECHELON LONG 60 440 (INSTRUMENTS) ×3 IMPLANT
STAPLER RELOAD BLUE 60MM (STAPLE) ×18
STAPLER RELOAD GOLD 60MM (STAPLE) ×3
STAPLER RELOAD GREEN 60MM (STAPLE) ×3
STRIP CLOSURE SKIN 1/2X4 (GAUZE/BANDAGES/DRESSINGS) ×2 IMPLANT
SUT MNCRL AB 4-0 PS2 18 (SUTURE) ×3 IMPLANT
SUT SURGIDAC NAB ES-9 0 48 120 (SUTURE) IMPLANT
SUT VICRYL 0 TIES 12 18 (SUTURE) ×3 IMPLANT
SYR 10ML ECCENTRIC (SYRINGE) ×3 IMPLANT
SYR 20ML LL LF (SYRINGE) ×3 IMPLANT
SYR 50ML LL SCALE MARK (SYRINGE) ×3 IMPLANT
TOWEL OR 17X26 10 PK STRL BLUE (TOWEL DISPOSABLE) ×3 IMPLANT
TOWEL OR NON WOVEN STRL DISP B (DISPOSABLE) ×3 IMPLANT
TROCAR ADV FIXATION 5X100MM (TROCAR) ×3 IMPLANT
TROCAR BLADELESS 15MM (ENDOMECHANICALS) ×3 IMPLANT
TROCAR BLADELESS OPT 5 100 (ENDOMECHANICALS) ×3 IMPLANT
TUBING CONNECTING 10 (TUBING) ×2 IMPLANT
TUBING CONNECTING 10' (TUBING) ×1
TUBING ENDO SMARTCAP (MISCELLANEOUS) ×3 IMPLANT

## 2020-12-08 NOTE — Anesthesia Preprocedure Evaluation (Addendum)
Anesthesia Evaluation  Patient identified by MRN, date of birth, ID band Patient awake    Reviewed: Allergy & Precautions, NPO status , Patient's Chart, lab work & pertinent test results  Airway Mallampati: II  TM Distance: >3 FB Neck ROM: Full    Dental no notable dental hx. (+) Teeth Intact, Dental Advisory Given   Pulmonary shortness of breath, sleep apnea and Continuous Positive Airway Pressure Ventilation ,    Pulmonary exam normal breath sounds clear to auscultation       Cardiovascular Normal cardiovascular exam Rhythm:Regular Rate:Normal  TTE 2022 1. Left ventricular ejection fraction, by estimation, is 60 to 65%. The  left ventricle has normal function. The left ventricle has no regional  wall motion abnormalities. Left ventricular diastolic parameters were  normal.  2. Right ventricular systolic function is normal. The right ventricular  size is normal.  3. The mitral valve is normal in structure. No evidence of mitral valve  regurgitation. No evidence of mitral stenosis.  4. The aortic valve is normal in structure. Aortic valve regurgitation is  not visualized. No aortic stenosis is present.  5. The inferior vena cava is normal in size with greater than 50%  respiratory variability, suggesting right atrial pressure of 3 mmHg.    Neuro/Psych PSYCHIATRIC DISORDERS Anxiety Depression negative neurological ROS     GI/Hepatic negative GI ROS, Neg liver ROS,   Endo/Other  diabetes, Type 2, Oral Hypoglycemic Agents  Renal/GU negative Renal ROS  negative genitourinary   Musculoskeletal negative musculoskeletal ROS (+)   Abdominal   Peds  Hematology negative hematology ROS (+) Sickle cell trait ,   Anesthesia Other Findings   Reproductive/Obstetrics                            Anesthesia Physical Anesthesia Plan  ASA: 3  Anesthesia Plan: General   Post-op Pain Management:     Induction: Intravenous  PONV Risk Score and Plan: 3 and Midazolam, Dexamethasone and Ondansetron  Airway Management Planned: Oral ETT  Additional Equipment:   Intra-op Plan:   Post-operative Plan: Extubation in OR  Informed Consent: I have reviewed the patients History and Physical, chart, labs and discussed the procedure including the risks, benefits and alternatives for the proposed anesthesia with the patient or authorized representative who has indicated his/her understanding and acceptance.     Dental advisory given  Plan Discussed with: CRNA  Anesthesia Plan Comments:         Anesthesia Quick Evaluation

## 2020-12-08 NOTE — Anesthesia Postprocedure Evaluation (Signed)
Anesthesia Post Note  Patient: Maria Johnson  Procedure(s) Performed: LAPAROSCOPIC GASTRIC SLEEVE RESECTION UPPER GI ENDOSCOPY     Patient location during evaluation: PACU Anesthesia Type: General Level of consciousness: awake and alert Pain management: pain level controlled Vital Signs Assessment: post-procedure vital signs reviewed and stable Respiratory status: spontaneous breathing, nonlabored ventilation, respiratory function stable and patient connected to nasal cannula oxygen Cardiovascular status: blood pressure returned to baseline and stable Postop Assessment: no apparent nausea or vomiting Anesthetic complications: no   No notable events documented.  Last Vitals:  Vitals:   12/08/20 1830 12/08/20 1850  BP: (!) 150/75 127/86  Pulse: (!) 101 98  Resp: 18 18  Temp: 36.9 C 36.6 C  SpO2: 97% 97%    Last Pain:  Vitals:   12/08/20 1850  TempSrc: Oral  PainSc: 10-Worst pain ever                 Elvert Cumpton L Khye Hochstetler

## 2020-12-08 NOTE — H&P (Signed)
Maria Johnson [id number]    Referring Provider:  Self     Subjective    Chief Complaint: morbid obesity      History of Present Illness: Returns for follow-up regarding surgical treatment of morbid obesity.  She denies any changes in her health since our initial meeting 2 months ago. She has completed the bariatric pathway without any barriers identified, and is currently scheduled for surgery on November 8.  She has several insightful questions to discuss today. CXR/UGI 09/28/2020: Negative, no hiatal hernia Labs done in May and August of this year reviewed. Dietitian-approved, 10/14/2020 Jon Gills [place])   Initial visit Very pleasant 09/11/20: 40yo woman with diabetes, Vit D deficiency, hyperlipidemia presents in consultation for surgical treatment of obesity. She has struggled with this since young adulthood and three pregnancies, the last of which she suffered with gestational diabetes. She has worked incredibly hard and at significant cost to try and lose the weight, with minimal result. She has tried diets, exercise programs, medications. Currently followed at the [facility] clinic and is on rybelsus, losing some weight with this. She suffers additionally with multiple orthopedic issues and has had 2 spine surgeries among others. She is interested in sleeve gastrectomy in order to improve her long term health, reduce her joint/back pain, and improve her diabetes.  She works in Programmer, applications.      Review of Systems: A complete review of systems was obtained from the patient.  I have reviewed this information and discussed as appropriate with the patient.  See HPI as well for other ROS.     Medical History:     Past Medical History:  Diagnosis Date   Anemia     Anxiety     Diabetes mellitus without complication (CMS-HCC)     Sleep apnea        There is no problem list on file for this patient.     History reviewed. No pertinent surgical history.    No Known Allergies          Current Outpatient Medications on File Prior to Visit  Medication Sig Dispense Refill   ALPRAZolam (XANAX) 0.5 MG tablet TAKE 1 TABLET(0.5 MG) BY MOUTH DAILY AS NEEDED FOR ANXIETY       atorvastatin (LIPITOR) 40 MG tablet Take 40 mg by mouth once daily       metFORMIN (GLUCOPHAGE-XR) 750 MG XR tablet Take by mouth       RYBELSUS 7 mg tablet Take 7 mg by mouth once daily       spironolactone (ALDACTONE) 50 MG tablet Take 50 mg by mouth 2 (two) times daily        No current facility-administered medications on file prior to visit.      History reviewed. No pertinent family history.    Social History       Tobacco Use  Smoking Status Never Smoker  Smokeless Tobacco Never Used      Social History        Socioeconomic History   Marital status: Married  Tobacco Use   Smoking status: Never Smoker   Smokeless tobacco: Never Used  Building services engineer Use: Never used  Substance and Sexual Activity   Alcohol use: Not Currently   Drug use: Defer   Sexual activity: Defer      Objective:         Vitals:    11/04/20 1419  BP: (!) 140/80  Pulse: (!) 114  Temp: 36.7 C (98  F)  SpO2: 100%  Weight: (!) 110 kg (242 lb 9.6 oz)  Height: 175.3 cm (5\' 9" )    Body mass index is 35.83 kg/m.   Alert, well appearing Unlabored respirations         Assessment and Plan:  There are no diagnoses linked to this encounter.    She remains an excellent candidate for sleeve gastrectomy. We have previously discussed the surgery including technical aspects, the risks of bleeding, infection, pain, scarring, injury to intra-abdominal structures, staple line leak or abscess, chronic abdominal pain or nausea, new onset or worsened GERD, DVT/PE, pneumonia, heart attack, stroke, death, failure to reach weight loss goals and weight regain, hernia.  Discussed the typical peri-, and postoperative course.  Discussed the importance of lifelong behavioral changes to combat the chronic and relapsing  disease which is obesity. Questions welcomed and answered to her satisfaction.  Plan to proceed as scheduled with sleeve gastrectomy on 12/08/2020       Jeaneane Adamec 13/08/2020, MD

## 2020-12-08 NOTE — Op Note (Addendum)
Maria Johnson [id number] 09-23-1980 12/08/2020  Preoperative diagnosis: sleeve gastrectomy in progress  Postoperative diagnosis: Same   Procedure: Upper endoscopy   Surgeon: Susy Frizzle B. Daphine Deutscher  M.D., FACS   Anesthesia: Gen.   Indications for procedure: This patient was undergoing a sleeve gastrectomy by Dr. Fredricka Bonine.    Description of procedure: The endoscopy was placed in the mouth and into the oropharynx and under endoscopic vision it was advanced to the esophagogastric junction.  The stomach was insufflated and it was cylindrical and the scope was passed into the antrum.  No bleeding or evidence of a leak was seen.   The scope was withdrawn without difficulty.     Matt B. Daphine Deutscher, MD, FACS General, Bariatric, & Minimally Invasive Surgery [facility], [place]

## 2020-12-08 NOTE — Op Note (Signed)
Operative Note  JANERA PEUGH  [id number]  [id number]  12/08/2020   Surgeon: Phylliss Blakes MD   Assistant: Wenda Low MD   Procedure performed: laparoscopic sleeve gastrectomy, upper endoscopy   Preop diagnosis: Morbid obesity Body mass index is 34.7 kg/m. Post-op diagnosis/intraop findings: same, enlarged steatotic liver   Specimens: fundus Retained items: none  EBL: minimal  Complications: none   Description of procedure: After obtaining informed consent and administration of chemical DVT prophylaxis in holding, the patient was taken to the operating room and placed supine on the operating room table where general endotracheal anesthesia was initiated, preoperative antibiotics were administered, SCDs applied, and a formal timeout was performed. The abdomen was prepped and draped in usual sterile fashion. Peritoneal access was gained using a Visiport technique in the left upper quadrant and insufflation to 15 mmHg ensued without issue. Gross inspection revealed no evidence of injury.  The liver is notably enlarged and consistent with hepatic steatosis.  Under direct visualization three more 5 mm trochars were placed in the right and left hemiabdomen and the 55mm trocar in the right paramedian upper abdomen. Bilateral laparoscopic assisted TAPS blocks were performed with Exparel diluted with 0.25 percent Marcaine with epinephrine. The patient was placed in steep Trendelenburg and the liver retractor was introduced through an incision in the upper midline and secured to the post externally to maintain the left lobe retracted anteriorly.  There is no hiatal hernia on direct inspection. Using the Harmonic scalpel, the greater curvature of the stomach was dissected away from the greater omentum and short gastric vessels were divided. This began 6 cm from the pylorus, and dissection proceeded until the left crus was clearly exposed. Esophageal fat pad was mobilized off the anterior stomach  slightly. The 62 [place] VisiGi was then introduced and directed down towards the pylorus. This was placed to suction against the lesser curve. Serial fires of the linear cutting stapler were then employed to create our sleeve. The first fire used a green load and ensured adequate room at the angularis incisura. One gold load and then several blue loads were then employed to create a narrow tubular stomach up to the angle of His. Seamguards used on all but the last 2 fires. The excised stomach was then removed through our 15 mm trocar site within an Endo Catch bag.  The visigi was taken off of suction and a few puffs of air were introduced, inflating the sleeve. No bubbles were observed in the irrigation fluid around the stomach and the shape was noted to be evenly tubular without any narrowing at the angularis. The visigi was then removed. Upper endoscopy was performed by the assistant surgeon and the sleeve was noted to be airtight, the staple line was hemostatic. Please see his separate note. The endoscope was removed. A small amount of oozing on the staple line, specifically the very most proximal aspect and the 2nd-3rd fire, was addressed with clips. The 15 mm trocar site fascia in the right upper abdomen was closed with a 0 Vicryl using the laparoscopic suture passer under direct visualization. The liver retractor was removed under direct visualization. The abdomen was then desufflated and all remaining trochars removed. The skin incisions were closed with subcuticular Monocryl; benzoin, Steri-Strips and Band-Aids were applied The patient was then awakened, extubated and taken to PACU in stable condition.     All counts were correct at the completion of the case.

## 2020-12-08 NOTE — Transfer of Care (Signed)
Immediate Anesthesia Transfer of Care Note  Patient: Maria Johnson  Procedure(s) Performed: LAPAROSCOPIC GASTRIC SLEEVE RESECTION UPPER GI ENDOSCOPY  Patient Location: PACU  Anesthesia Type:General  Level of Consciousness: drowsy  Airway & Oxygen Therapy: Patient Spontanous Breathing and Patient connected to face mask oxygen  Post-op Assessment: Report given to RN and Post -op Vital signs reviewed and stable  Post vital signs: Reviewed and stable  Last Vitals:  Vitals Value Taken Time  BP 145/88 12/08/20 1726  Temp    Pulse 101 12/08/20 1727  Resp 18 12/08/20 1727  SpO2 100 % 12/08/20 1727  Vitals shown include unvalidated device data.  Last Pain:  Vitals:   12/08/20 1253  TempSrc:   PainSc: 0-No pain         Complications: No notable events documented.

## 2020-12-08 NOTE — Anesthesia Procedure Notes (Signed)
Procedure Name: Intubation Date/Time: 12/08/2020 3:49 PM Performed by: Elyn Peers, CRNA Pre-anesthesia Checklist: Patient identified, Emergency Drugs available, Suction available, Patient being monitored and Timeout performed Patient Re-evaluated:Patient Re-evaluated prior to induction Oxygen Delivery Method: Circle system utilized Preoxygenation: Pre-oxygenation with 100% oxygen Induction Type: IV induction Ventilation: Mask ventilation without difficulty Laryngoscope Size: Miller and 3 Grade View: Grade I Tube type: Oral Tube size: 7.5 mm Number of attempts: 1 Airway Equipment and Method: Stylet Placement Confirmation: ETT inserted through vocal cords under direct vision, positive ETCO2 and breath sounds checked- equal and bilateral Secured at: 23 cm Tube secured with: Tape Dental Injury: Teeth and Oropharynx as per pre-operative assessment

## 2020-12-08 NOTE — Progress Notes (Signed)
PHARMACY CONSULT FOR:  Risk Assessment for Post-Discharge VTE Following Bariatric Surgery  Post-Discharge VTE Risk Assessment: This patient's probability of 30-day post-discharge VTE is increased due to the factors marked:   Female    Age >/=60 years    BMI >/=50 kg/m2    CHF    Dyspnea at Rest    Paraplegia  x  Non-gastric-band surgery    Operation Time >/=3 hr    Return to OR     Length of Stay >/= 3 d   Hx of VTE   Hypercoagulable condition   Significant venous stasis    Predicted probability of 30-day post-discharge VTE: 0.16%  Other patient-specific factors to consider:   Recommendation for Discharge: No pharmacologic prophylaxis post-discharge   Maria Johnson is a 40 y.o. female who underwent laparoscopic sleeve gastrectomy, upper endoscopy on 12/08/20   Case start: 1606 Case end: 1710   No Known Allergies  Patient Measurements: Height: 5\' 9"  (175.3 cm) Weight: 106.6 kg (235 lb) IBW/kg (Calculated) : 66.2 Body mass index is 34.7 kg/m.  No results for input(s): WBC, HGB, HCT, PLT, APTT, CREATININE, LABCREA, CREATININE, CREAT24HRUR, MG, PHOS, ALBUMIN, PROT, ALBUMIN, AST, ALT, ALKPHOS, BILITOT, BILIDIR, IBILI in the last 72 hours. Estimated Creatinine Clearance: 111.8 mL/min (by C-G formula based on SCr of 0.87 mg/dL).    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   12/07/2020   spironolactone (ALDACTONE) 50 MG tablet TAKE 1 TABLET(50 MG) BY MOUTH TWICE DAILY 60 tablet 2 Past Month   Vitamin D, Ergocalciferol, (DRISDOL) 1.25 MG (50000 UNIT) CAPS capsule 1 po q Wed and 1 po q Sun (Patient taking differently: Take 50,000 Units by mouth 2 (two) times a week. 1 po q Wed and 1 po q Sun) 8 capsule 0 Past Week   ALPRAZolam (XANAX) 0.5 MG tablet TAKE 1 TABLET(0.5 MG) BY MOUTH DAILY AS NEEDED FOR ANXIETY 30 tablet 2 More than a month     Sat, PharmD, BCPS Pharmacy: [phone] 12/08/2020,6:53 PM

## 2020-12-09 ENCOUNTER — Encounter (HOSPITAL_COMMUNITY): Payer: Self-pay | Admitting: Surgery

## 2020-12-09 LAB — CBC WITH DIFFERENTIAL/PLATELET
Abs Immature Granulocytes: 0.07 10*3/uL (ref 0.00–0.07)
Basophils Absolute: 0 10*3/uL (ref 0.0–0.1)
Basophils Relative: 0 %
Eosinophils Absolute: 0 10*3/uL (ref 0.0–0.5)
Eosinophils Relative: 0 %
HCT: 37.5 % (ref 36.0–46.0)
Hemoglobin: 12.1 g/dL (ref 12.0–15.0)
Immature Granulocytes: 1 %
Lymphocytes Relative: 7 %
Lymphs Abs: 1 10*3/uL (ref 0.7–4.0)
MCH: 27.1 pg (ref 26.0–34.0)
MCHC: 32.3 g/dL (ref 30.0–36.0)
MCV: 84.1 fL (ref 80.0–100.0)
Monocytes Absolute: 0.4 10*3/uL (ref 0.1–1.0)
Monocytes Relative: 3 %
Neutro Abs: 12.7 10*3/uL — ABNORMAL HIGH (ref 1.7–7.7)
Neutrophils Relative %: 89 %
Platelets: 374 10*3/uL (ref 150–400)
RBC: 4.46 MIL/uL (ref 3.87–5.11)
RDW: 14 % (ref 11.5–15.5)
WBC: 14.2 10*3/uL — ABNORMAL HIGH (ref 4.0–10.5)
nRBC: 0 % (ref 0.0–0.2)

## 2020-12-09 LAB — COMPREHENSIVE METABOLIC PANEL
ALT: 92 U/L — ABNORMAL HIGH (ref 0–44)
AST: 64 U/L — ABNORMAL HIGH (ref 15–41)
Albumin: 3.7 g/dL (ref 3.5–5.0)
Alkaline Phosphatase: 51 U/L (ref 38–126)
Anion gap: 7 (ref 5–15)
BUN: 9 mg/dL (ref 6–20)
CO2: 22 mmol/L (ref 22–32)
Calcium: 8.8 mg/dL — ABNORMAL LOW (ref 8.9–10.3)
Chloride: 105 mmol/L (ref 98–111)
Creatinine, Ser: 1.06 mg/dL — ABNORMAL HIGH (ref 0.44–1.00)
GFR, Estimated: >60 mL/min (ref >60)
Glucose, Bld: 208 mg/dL — ABNORMAL HIGH (ref 70–99)
Potassium: 4.5 mmol/L (ref 3.5–5.1)
Sodium: 134 mmol/L — ABNORMAL LOW (ref 135–145)
Total Bilirubin: 0.3 mg/dL (ref 0.3–1.2)
Total Protein: 7.1 g/dL (ref 6.5–8.1)

## 2020-12-09 LAB — MAGNESIUM: Magnesium: 1.7 mg/dL (ref 1.7–2.4)

## 2020-12-09 MED ORDER — MAGNESIUM SULFATE 50 % IJ SOLN
3.0000 g | Freq: Once | INTRAMUSCULAR | Status: AC
  Administered 2020-12-09: 3 g via INTRAVENOUS
  Filled 2020-12-09: qty 6

## 2020-12-09 MED ORDER — TRAMADOL HCL 50 MG PO TABS: 50.0000 mg | ORAL_TABLET | Freq: Four times a day (QID) | ORAL | 0 refills | Status: DC | PRN

## 2020-12-09 MED ORDER — GABAPENTIN 100 MG PO CAPS: 200.0000 mg | ORAL_CAPSULE | Freq: Two times a day (BID) | ORAL | 0 refills | Status: DC

## 2020-12-09 MED ORDER — PANTOPRAZOLE SODIUM 40 MG PO TBEC: 40.0000 mg | DELAYED_RELEASE_TABLET | Freq: Every day | ORAL | 0 refills | Status: DC

## 2020-12-09 MED ORDER — ONDANSETRON 4 MG PO TBDP: 4.0000 mg | ORAL_TABLET | Freq: Four times a day (QID) | ORAL | 0 refills | Status: DC | PRN

## 2020-12-09 MED ORDER — ACETAMINOPHEN 500 MG PO TABS: 1000.0000 mg | ORAL_TABLET | Freq: Three times a day (TID) | ORAL | 0 refills | Status: AC

## 2020-12-09 NOTE — Progress Notes (Signed)
Patient was given discharge instructions by Oliver Barre, and all questions were answered. Patient was stable for discharge and was taken to the main exit by wheelchair.

## 2020-12-09 NOTE — Progress Notes (Signed)
Patient alert and oriented, pain is controlled. Patient is tolerating fluids, advanced to protein shake today, patient is tolerating well.  Reviewed Gastric sleeve discharge instructions with patient and patient is able to articulate understanding.  Provided information on BELT program, Support Group and [facility] outpatient pharmacy. All questions answered, will continue to monitor.  

## 2020-12-09 NOTE — Progress Notes (Signed)
24hr fluid recall prior to discharge: 690mL.  Per dehydration protocol, will call pt to f/u within one week post op. °

## 2020-12-09 NOTE — Progress Notes (Signed)
S: No acute issues.  Pain has been well controlled, minimal nausea, tolerating liquids and has started protein.  Has walked several times.  Does complain of gurgling in her mid abdomen.  O: Vitals, labs, intake/output, and orders reviewed at this time.  Afebrile, heart rate 100, mildly hypertensive, sats 100% on room air.  P.o. 360, urine output 2000.  CMP with hyperglycemia, creatinine 1.06 (0.87 preop), magnesium 1.7; WBC 14.2 (9 preop), hemoglobin 12.1 (13.4), platelets 374 (414) Has received tramadol once overnight, Robaxin once overnight, no other prms  Gen: A&Ox3, no distress  H&N: EOMI, atraumatic, neck supple Chest: unlabored respirations, RRR Abd: soft, nontender, nondistended, incision(s) c/d/i without cellulitis or Ext: warm, no edema Neuro: grossly normal  Lines/tubes/drains: piv  A/P: Postop day 1 status post laparoscopic sleeve gastrectomy -Continue clear liquids, protein shakes -Continue SCDs, prophylactic Lovenox -Continue pulmonary toilet, ambulation -Replace magnesium IV  Plan discharge later this afternoon if continuing to do well.   Phylliss Blakes, MD [facility], [place]

## 2020-12-09 NOTE — Discharge Instructions (Signed)

## 2020-12-10 NOTE — Discharge Summary (Signed)
Physician Discharge Summary  Maria Johnson PPI:[id number] DOB: [birth year] DOA: 12/08/2020  PCP: Shelda Pal, DO  Admit date: 12/08/2020 Discharge date: 12/09/2020  Recommendations for Outpatient Follow-up:    Follow-up Information     Clovis Riley, MD. Go on 12/30/2020.   Specialty: General Surgery Why: at 2:20pm.  Please arrive 15 minutes prior to appointment time.  Thank you. Contact information: [address] [place] [address] [phone]         Carlena Hurl, PA-C. Go on 02/04/2021.   Specialty: General Surgery Why: at 9am for Dr. Kae Heller.  Please arrive 15 minutes prior to your appointment time.  Thank you. Contact information: [place]  [address] [phone]                Discharge Diagnoses:  Active Problems:   Morbid obesity ([place])   Surgical Procedure: Laparoscopic Sleeve Gastrectomy, upper endoscopy  Discharge Condition: Good Disposition: Home  Diet recommendation: Postoperative sleeve gastrectomy diet (liquids only)  Filed Weights   12/08/20 1235  Weight: 106.6 kg     Hospital Course:  The patient was admitted for a planned laparoscopic sleeve gastrectomy. Please see operative note. Preoperatively the patient was given 5000 units of subcutaneous heparin for DVT prophylaxis. Postoperative prophylactic Lovenox was held given significant oozing at her incisions at the end of the case. On the evening of postoperative day 0, the patient was started on water and ice chips. On postoperative day 1 the patient had no fever or tachycardia and was tolerating water in their diet was gradually advanced throughout the day. The patient was ambulating without difficulty. Their vital signs are stable without fever or tachycardia. The patient had received discharge instructions and counseling. They were deemed stable for discharge and had met discharge criteria   Discharge Instructions   Allergies  as of 12/09/2020   No Known Allergies      Medication List     STOP taking these medications    metFORMIN 750 MG 24 hr tablet Commonly known as: GLUCOPHAGE-XR   spironolactone 50 MG tablet Commonly known as: ALDACTONE       TAKE these medications    acetaminophen 500 MG tablet Commonly known as: TYLENOL Take 2 tablets (1,000 mg total) by mouth every 8 (eight) hours for 5 days.   ALPRAZolam 0.5 MG tablet Commonly known as: XANAX TAKE 1 TABLET(0.5 MG) BY MOUTH DAILY AS NEEDED FOR ANXIETY   atorvastatin 40 MG tablet Commonly known as: LIPITOR TAKE 1 TABLET(40 MG) BY MOUTH DAILY   gabapentin 100 MG capsule Commonly known as: NEURONTIN Take 2 capsules (200 mg total) by mouth every 12 (twelve) hours.   ondansetron 4 MG disintegrating tablet Commonly known as: ZOFRAN-ODT Take 1 tablet (4 mg total) by mouth every 6 (six) hours as needed for nausea or vomiting.   pantoprazole 40 MG tablet Commonly known as: PROTONIX Take 1 tablet (40 mg total) by mouth daily.   traMADol 50 MG tablet Commonly known as: ULTRAM Take 1 tablet (50 mg total) by mouth every 6 (six) hours as needed (pain).   Vitamin D (Ergocalciferol) 1.25 MG (50000 UNIT) Caps capsule Commonly known as: DRISDOL 1 po q Wed and 1 po q Sun What changed:  how much to take how to take this when to take this        Follow-up Information     Clovis Riley, MD. Go on 12/30/2020.   Specialty: General Surgery Why:  at 2:20pm.  Please arrive 15 minutes prior to appointment time.  Thank you. Contact information: [address] [place] [address] [phone]         Carlena Hurl, PA-C. Go on 02/04/2021.   Specialty: General Surgery Why: at 9am for Dr. Kae Heller.  Please arrive 15 minutes prior to your appointment time.  Thank you. Contact information: [address] [place] [address] [phone]                  The results of significant diagnostics from  this hospitalization (including imaging, microbiology, ancillary and laboratory) are listed below for reference.    Significant Diagnostic Studies: No results found.  Labs: Basic Metabolic Panel: Recent Labs  Lab 12/09/20 0419  NA 134*  K 4.5  CL 105  CO2 22  GLUCOSE 208*  BUN 9  CREATININE 1.06*  CALCIUM 8.8*  MG 1.7   Liver Function Tests: Recent Labs  Lab 12/09/20 0419  AST 64*  ALT 92*  ALKPHOS 51  BILITOT 0.3  PROT 7.1  ALBUMIN 3.7    CBC: Recent Labs  Lab 12/09/20 0419  WBC 14.2*  NEUTROABS 12.7*  HGB 12.1  HCT 37.5  MCV 84.1  PLT 374    CBG: Recent Labs  Lab 12/08/20 1238 12/08/20 1729  GLUCAP 111* 179*    Active Problems:   Morbid obesity ([place])    Signed:  [facility], [place] [phone] 12/10/2020, 8:38 AM

## 2020-12-15 ENCOUNTER — Telehealth (HOSPITAL_COMMUNITY): Payer: Self-pay | Admitting: *Deleted

## 2020-12-15 NOTE — Telephone Encounter (Signed)
1.  Tell me about your pain and pain management? Pt c/o 5-6/10 intermittent abdominal incisional pain with movement and exertion.  Pt states that she has been taking Tylenol, but has not needed any tramadol since Sunday. Discussed with patient to try and splint her abdomen with changing positions to assist with the discomfort.  Encouraged pt to try options and/or contact [facility] if still concerned.   2.  Let's talk about fluid intake.  How much total fluid are you taking in? Pt states that she is getting in at least 100oz of fluid including protein shakes, bottled water, chicken broth, jello and yogurt.  Pt states that she is experiencing extreme hunger pains at night.  Discussed with pt trying to have more full liquids closer to bed time and also trying the cream soups strained.  3.  How much protein have you taken in the last 2 days? Pt states she is meeting her goal of 60g of protein each day with the protein shakes.  4.  Have you had nausea?  Tell me about when have experienced nausea and what you did to help? Pt denies nausea.   5.  Has the frequency or color changed with your urine? Pt states that she is urinating "fine" with no changes in frequency or urgency.     6.  Tell me what your incisions look like? "Incisions look fine". Pt denies a fever, chills.  Pt states incisions are not swollen, open, or draining.  Pt encouraged to call [facility] if incisions change.   7.  Have you been passing gas? BM? Pt states that she is having BMs. Last BM 12/14/20.     8.  If a problem or question were to arise who would you call?  Do you know contact numbers for [facility], [facility], and [facility]? Pt denies dehydration symptoms.  Pt can describe s/sx of dehydration.  Pt knows to call [facility] for surgical, [facility] for nutrition, and [facility] for non-urgent questions or concerns.   9.  How has the walking going? Pt states she is walking around and able to be active without difficulty.   10. Are you still using your incentive  spirometer?  If so, how often? Pt states that is using her I.S. daily. Pt encouraged to use incentive spirometer, at least 10x every hour while awake until she sees the surgeon.  11.  How are your vitamins and calcium going?  How are you taking them? Pt states that she is taking her supplements and vitamins without difficulty.  Reminded patient that the first 30 days post-operatively are important for successful recovery.  Practice good hand hygiene, wearing a mask when appropriate (since optional in most places), and minimizing exposure to people who live outside of the home, especially if they are exhibiting any respiratory, GI, or illness-like symptoms.

## 2020-12-16 LAB — SURGICAL PATHOLOGY

## 2020-12-22 ENCOUNTER — Encounter: Payer: Managed Care, Other (non HMO) | Attending: Surgery | Admitting: Skilled Nursing Facility1

## 2020-12-22 ENCOUNTER — Other Ambulatory Visit: Payer: Self-pay

## 2020-12-22 DIAGNOSIS — E1169 Type 2 diabetes mellitus with other specified complication: Secondary | ICD-10-CM | POA: Diagnosis present

## 2020-12-22 NOTE — Progress Notes (Signed)
2 Week Post-Operative Nutrition Class   Patient was seen on 12/22/2020 for Post-Operative Nutrition education at the [facility].    Surgery date: 12/08/2020 Surgery type: sleeve gastrectomy Start weight at [facility]: 240.3 Weight today: 222.3 Bowel Habits: Every day to every other day no complaints   Body Composition Scale 12/22/2020  Current Body Weight 222.3  Total Body Fat % 38.4  Visceral Fat 10  Fat-Free Mass % 61.5   Total Body Water % 45.2  Muscle-Mass lbs 35.9  BMI 32.6  Body Fat Displacement          Torso  lbs 52.9         Left Leg  lbs 10.5         Right Leg  lbs 10.5         Left Arm  lbs 5.2         Right Arm   lbs 5.2      The following the learning objectives were met by the patient during this course: Identifies Phase 3 (Soft, High Proteins) Dietary Goals and will begin from 2 weeks post-operatively to 2 months post-operatively Identifies appropriate sources of fluids and proteins  Identifies appropriate fat sources and healthy verses unhealthy fat types   States protein recommendations and appropriate sources post-operatively Identifies the need for appropriate texture modifications, mastication, and bite sizes when consuming solids Identifies appropriate fat consumption and sources Identifies appropriate multivitamin and calcium sources post-operatively Describes the need for physical activity post-operatively and will follow MD recommendations States when to call healthcare provider regarding medication questions or post-operative complications   Handouts given during class include: Phase 3A: Soft, High Protein Diet Handout Phase 3 High Protein Meals Healthy Fats   Follow-Up Plan: Patient will follow-up at [facility] in 6 weeks for 2 month post-op nutrition visit for diet advancement per MD.

## 2020-12-28 ENCOUNTER — Telehealth: Payer: Self-pay | Admitting: Skilled Nursing Facility1

## 2020-12-28 NOTE — Telephone Encounter (Signed)
RD called pt to verify fluid intake once starting soft, solid proteins 2 week post-bariatric surgery.   Daily Fluid intake: 64 oz Daily Protein intake: 60 g with protein shakes stating she does not have an appetite  Bowel movements:   Concerns/issues:   Able tolerate without issue but does not want it: referring to solid proteins. Pt states she has no issue with chicken. Pt states she does like plant based proteins though.    Dietitian advised just to focus on plant based proteins instead of the animal based

## 2020-12-29 ENCOUNTER — Encounter: Payer: Self-pay | Admitting: Family Medicine

## 2020-12-29 ENCOUNTER — Other Ambulatory Visit: Payer: Self-pay

## 2020-12-29 ENCOUNTER — Ambulatory Visit (INDEPENDENT_AMBULATORY_CARE_PROVIDER_SITE_OTHER): Payer: Managed Care, Other (non HMO) | Admitting: Family Medicine

## 2020-12-29 VITALS — BP 120/68 | HR 90 | Temp 97.9°F | Ht 69.0 in | Wt 221.2 lb

## 2020-12-29 DIAGNOSIS — E1169 Type 2 diabetes mellitus with other specified complication: Secondary | ICD-10-CM

## 2020-12-29 DIAGNOSIS — E559 Vitamin D deficiency, unspecified: Secondary | ICD-10-CM

## 2020-12-29 DIAGNOSIS — Z23 Encounter for immunization: Secondary | ICD-10-CM | POA: Diagnosis not present

## 2020-12-29 DIAGNOSIS — E785 Hyperlipidemia, unspecified: Secondary | ICD-10-CM

## 2020-12-29 LAB — COMPREHENSIVE METABOLIC PANEL
ALT: 27 U/L (ref 0–35)
AST: 19 U/L (ref 0–37)
Albumin: 4.5 g/dL (ref 3.5–5.2)
Alkaline Phosphatase: 51 U/L (ref 39–117)
BUN: 17 mg/dL (ref 6–23)
CO2: 26 mEq/L (ref 19–32)
Calcium: 10.1 mg/dL (ref 8.4–10.5)
Chloride: 104 mEq/L (ref 96–112)
Creatinine, Ser: 1.09 mg/dL (ref 0.40–1.20)
GFR: 63.49 mL/min (ref >60.00)
Glucose, Bld: 84 mg/dL (ref 70–99)
Potassium: 4.4 mEq/L (ref 3.5–5.1)
Sodium: 138 mEq/L (ref 135–145)
Total Bilirubin: 0.3 mg/dL (ref 0.2–1.2)
Total Protein: 7.2 g/dL (ref 6.0–8.3)

## 2020-12-29 LAB — LIPID PANEL
Cholesterol: 166 mg/dL (ref 0–200)
HDL: 40.5 mg/dL (ref >39.00)
LDL Cholesterol: 103 mg/dL — ABNORMAL HIGH (ref 0–99)
NonHDL: 125.98
Total CHOL/HDL Ratio: 4
Triglycerides: 117 mg/dL (ref 0.0–149.0)
VLDL: 23.4 mg/dL (ref 0.0–40.0)

## 2020-12-29 LAB — VITAMIN D 25 HYDROXY (VIT D DEFICIENCY, FRACTURES): VITD: 48.15 ng/mL (ref 30.00–100.00)

## 2020-12-29 LAB — MAGNESIUM: Magnesium: 2.2 mg/dL (ref 1.5–2.5)

## 2020-12-29 LAB — HEMOGLOBIN A1C: Hgb A1c MFr Bld: 6.7 % — ABNORMAL HIGH (ref 4.6–6.5)

## 2020-12-29 NOTE — Progress Notes (Signed)
Subjective:   Chief Complaint  Patient presents with   Follow-up    Maria Johnson is a 40 y.o. female here for follow-up of diabetes.   [name]'s self monitored glucose range is 90-low 100's.  Patient denies hypoglycemic reactions. She checks her glucose levels 1-2 time(s) per day. Patient does not require insulin.   Medications include: Diet controlled Diet is healthier, liquid solid proteins  Exercise: walking  Hyperlipidemia Patient presents for dyslipidemia follow up. Currently being treated with Lipitor 40 mg/d and compliance with treatment thus far has been good. She denies myalgias. Diet/exercise as above. No Cp or SOB.  The patient is not known to have coexisting coronary artery disease.  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   Pes planus 10/22/2015   Pneumonia    Sickle cell trait (HCC)    Sleep apnea    has cpap   SOBOE (shortness of breath on exertion)    Sprain of medial collateral ligament of left knee 10/30/2018   Type 2 diabetes mellitus with hyperglycemia (HCC) 08/12/2013   Vitamin D deficiency 08/24/2017   Well adult exam 09/25/2017     Related testing: Retinal exam: Done Pneumovax: Due for PCV20  Objective:  BP 120/68   Pulse 90   Temp 97.9 F (36.6 C) (Oral)   Ht 5\' 9"  (1.753 m)   Wt 221 lb 4 oz (100.4 kg)   LMP 08/09/2007   SpO2 96%   BMI 32.67 kg/m  General:  Well developed, well  nourished, in no apparent distress Eyes:  Pupils equal and round, sclera anicteric without injection  Lungs:  CTAB, no access msc use Cardio:  RRR, no bruits, no LE edema Psych: Age appropriate judgment and insight  Assessment:   Hyperlipidemia associated with type 2 diabetes mellitus (HCC) - Plan: Lipid panel, Hemoglobin A1c, Comprehensive metabolic panel  Hypomagnesemia - Plan: Magnesium  Vitamin D deficiency - Plan: VITAMIN D 25 Hydroxy (Vit-D Deficiency, Fractures)   Plan:   Chronic, stable. Ck labs. Cont diet control. Counseled on diet and exercise. Flu shot and PCV20 today.  Chronic, stable. Cont Lipitor 40 mg/d.  F/u in 6 mo. The patient voiced understanding and agreement to the plan.  10/10/2007 [place], DO 12/29/20 12:50 PM

## 2020-12-29 NOTE — Patient Instructions (Addendum)
Give us 2-3 business days to get the results of your labs back.   Keep the diet clean and stay active.  I recommend getting the updated bivalent covid vaccination booster at your convenience.   Let us know if you need anything. 

## 2020-12-29 NOTE — Addendum Note (Signed)
Addended by: Scharlene Gloss B on: 12/29/2020 01:48 PM   Modules accepted: Orders

## 2021-01-06 ENCOUNTER — Encounter: Payer: Self-pay | Admitting: General Practice

## 2021-01-06 ENCOUNTER — Other Ambulatory Visit: Payer: Self-pay

## 2021-01-06 ENCOUNTER — Encounter: Payer: Self-pay | Admitting: Obstetrics and Gynecology

## 2021-01-06 ENCOUNTER — Ambulatory Visit (INDEPENDENT_AMBULATORY_CARE_PROVIDER_SITE_OTHER): Payer: Managed Care, Other (non HMO) | Admitting: Obstetrics and Gynecology

## 2021-01-06 VITALS — BP 127/75 | HR 98 | Wt 217.0 lb

## 2021-01-06 DIAGNOSIS — N907 Vulvar cyst: Secondary | ICD-10-CM

## 2021-01-06 NOTE — Progress Notes (Signed)
VULVAR I&D NOTE The indications for vulvar incision and drainage were reviewed.   She had a wax on October 21 and issues since and once of which is a left labial cyst. It is nontender but has been present since and bothersome to her.   Risks of the I&D including pain, bleeding, infection, and need for additional procedures  were discussed. The patient stated understanding and agreed to undergo procedure today. Consent was signed, time out performed.   The patient's left labia was prepped with Betadine. 1% lidocaine was injected into left labia. A 3-mm incision was done, and the cyst wall was removed progressively with a small kelly and the raytec sponge. The contents were thick white and hard. The cyst wall was removed in its entirety. Minimal bleeding and hemostasis was achieved using silver nitrate sticks.  The patient tolerated the procedure well.   Post-procedure instructions (pelvic rest for one week) were given to the patient. The patient is to call with heavy bleeding, fever greater than 100.4, foul smelling vaginal discharge or other concerns.

## 2021-02-02 ENCOUNTER — Other Ambulatory Visit: Payer: Self-pay

## 2021-02-02 ENCOUNTER — Encounter: Payer: Managed Care, Other (non HMO) | Attending: Surgery | Admitting: Skilled Nursing Facility1

## 2021-02-02 ENCOUNTER — Encounter: Payer: Self-pay | Admitting: Skilled Nursing Facility1

## 2021-02-02 DIAGNOSIS — E669 Obesity, unspecified: Secondary | ICD-10-CM | POA: Insufficient documentation

## 2021-02-02 DIAGNOSIS — E1169 Type 2 diabetes mellitus with other specified complication: Secondary | ICD-10-CM | POA: Diagnosis present

## 2021-02-02 NOTE — Progress Notes (Signed)
Bariatric Nutrition Follow-Up Visit Medical Nutrition Therapy   NUTRITION ASSESSMENT    Surgery date: 12/08/2020 Surgery type: sleeve gastrectomy Start weight at [facility]: 240.3 Weight today: 202.5 Bowel Habits: Every day to every other day no complaints   Body Composition Scale 12/22/2020 02/02/2021  Current Body Weight 222.3 202.5  Total Body Fat % 38.4 35.3  Visceral Fat 10 8  Fat-Free Mass % 61.5 64.6   Total Body Water % 45.2 46.8  Muscle-Mass lbs 35.9 35.7  BMI 32.6 29.6  Body Fat Displacement           Torso  lbs 52.9 44.2         Left Leg  lbs 10.5 8.8         Right Leg  lbs 10.5 8.8         Left Arm  lbs 5.2 4.4         Right Arm   lbs 5.2 4.4   Clinical  Medical hx: DM, hyperlipidemia  Medications: Rybelsus, metofrmin atorvastatin, spirolactone, xanax, vitamin D  Labs: A1C 6.7 Notable signs/symptoms: migraines Any previous deficiencies? Yes, vitamin D   Lifestyle & Dietary Hx   Pt states she has had headaches in the week.  Pt state she was worried she is not having issues. Pt state she starts BELT this Friday.   Pt states since surgery she has been really cold.  Pt states she limits her drinks to 20 calories with no sugar. Pt states she cannot drink tea because it does not go down.   Pt is doing very well and making great choices.   Estimated daily fluid intake: 80 oz Estimated daily protein intake: 60+ g Supplements: multi and calcium  Current average weekly physical activity: 30-45 minutes  5 days a week taking the stairs everywhere she goes; weights as well  24-Hr Dietary Recall: doe snot snack First Meal: protein shake or eggs + bacon Snack:   Second Meal: wings + beans Snack:   Third Meal: protein yogurt and sugar free jello or half [place] burger + cheese wrap Snack:  Beverages: flavored water, gingerale, water, plain water  Post-Op Goals/ Signs/ Symptoms Using straws: no Drinking while eating: no Chewing/swallowing difficulties: no Changes in  vision: no Changes to mood/headaches: no Hair loss/changes to skin/nails: no Difficulty focusing/concentrating: no Sweating: no Limb weakness: no Dizziness/lightheadedness: no Palpitations: no Carbonated/caffeinated beverages: no N/V/D/C/Gas: bowel movement every other day Abdominal pain: no Dumping syndrome: no    NUTRITION DIAGNOSIS  Overweight/obesity ([place]-3.3) related to past poor dietary habits and physical inactivity as evidenced by completed bariatric surgery and following dietary guidelines for continued weight loss and healthy nutrition status.     NUTRITION INTERVENTION Nutrition counseling (C-1) and education (E-2) to facilitate bariatric surgery goals, including: Diet advancement to the next phase (phase 4) now including non starchy vegetables  The importance of consuming adequate calories as well as certain nutrients daily due to the body's need for essential vitamins, minerals, and fats The importance of daily physical activity and to reach a goal of at least 150 minutes of moderate to vigorous physical activity weekly (or as directed by their physician) due to benefits such as increased musculature and improved lab values The importance of intuitive eating specifically learning hunger-satiety cues and understanding the importance of learning a new body: The importance of mindful eating to avoid grazing behaviors   Goals: -Continue to aim for a minimum of 64 fluid ounces 7 days a week with at least 30 ounces  being plain water  -Eat non-starchy vegetables 2 times a day 7 days a week  -Start out with soft cooked vegetables today and tomorrow; if tolerated begin to eat raw vegetables or cooked including salads  -Eat your 3 ounces of protein first then start in on your non-starchy vegetables; once you understand how much of your meal leads to satisfaction and not full while still eating 3 ounces of protein and non-starchy vegetables you can eat them in any order   -Continue  to aim for 30 minutes of activity at least 5 times a week  -Do NOT cook with/add to your food: alfredo sauce, cheese sauce, barbeque sauce, ketchup, fat back, butter, bacon grease, grease, Crisco, OR SUGAR   Handouts Provided Include  Phase 4  Learning Style & Readiness for Change Teaching method utilized: Visual & Auditory  Demonstrated degree of understanding via: Teach Back  Readiness Level: Action Barriers to learning/adherence to lifestyle change: none stated  RD's Notes for Next Visit Assess adherence to pt chosen goals    MONITORING & EVALUATION Dietary intake, weekly physical activity, body weight  Next Steps Patient is to follow-up in March

## 2021-03-16 ENCOUNTER — Other Ambulatory Visit: Payer: Self-pay

## 2021-03-16 ENCOUNTER — Encounter: Payer: Managed Care, Other (non HMO) | Attending: Surgery | Admitting: Skilled Nursing Facility1

## 2021-03-16 DIAGNOSIS — E1169 Type 2 diabetes mellitus with other specified complication: Secondary | ICD-10-CM | POA: Insufficient documentation

## 2021-03-17 NOTE — Progress Notes (Signed)
Follow-up visit:  Post-Operative sleeve Surgery  Medical Nutrition Therapy:  Appt start time: 6:00pm end time:  7:00pm  Primary concerns today: Post-operative Bariatric Surgery Nutrition Management 6 Month Post-Op Class  Surgery date: 12/08/2020 Surgery type: sleeve gastrectomy Start weight at [facility]: 240.3 Weight today: 196 pounds Bowel Habits: Every day to every other day no complaints   Body Composition Scale 12/22/2020 02/02/2021 03/16/2021  Current Body Weight 222.3 202.5 196  Total Body Fat % 38.4 35.3 34.2  Visceral Fat 10 8 8   Fat-Free Mass % 61.5 64.6 65.7   Total Body Water % 45.2 46.8 47.3  Muscle-Mass lbs 35.9 35.7 35.6  BMI 32.6 29.6 28.7  Body Fat Displacement            Torso  lbs 52.9 44.2 41.5         Left Leg  lbs 10.5 8.8 8.3         Right Leg  lbs 10.5 8.8 8.3         Left Arm  lbs 5.2 4.4 4.1         Right Arm   lbs 5.2 4.4 4.1   Clinical  Medical hx: DM, hyperlipidemia  Medications: Rybelsus, metofrmin atorvastatin, spirolactone, xanax, vitamin D  Labs: A1C 6.7 Notable signs/symptoms: migraines Any previous deficiencies? Yes, vitamin D    Information Reviewed/ Discussed During Appointment: -Review of composition scale numbers -Fluid requirements (64-100 ounces) -Protein requirements (60-80g) -Strategies for tolerating diet -Advancement of diet to include Starchy vegetables -Barriers to inclusion of new foods -Inclusion of appropriate multivitamin and calcium supplements  -Exercise recommendations   Fluid intake: adequate   Medications: See List Supplementation: appropriate    Using straws: no Drinking while eating: no Having you been chewing well: yes Chewing/swallowing difficulties: no Changes in vision: no Changes to mood/headaches: no Hair loss/C[place]nges to skin/Changes to nails: no Any difficulty focusing or concentrating: no Sweating: no Dizziness/Lightheaded: no Palpitations: no  Carbonated beverages: no N/V/D/C/GAS:  no Abdominal Pain: no Dumping syndrome: no  Recent physical activity:  ADL's  Progress Towards Goal(s):  In Progress Teaching method utilized: Visual & Auditory  Demonstrated degree of understanding via: Teach Back  Readiness Level: Action Barriers to learning/adherence to lifestyle change: none identified  Handouts given during visit include: Phase V diet Progression  Goals Sheet The Benefits of Exercise are endless..... Support Group Topics   Teaching Method Utilized:  Visual Auditory Hands on  Demonstrated degree of understanding via:  Teach Back   Monitoring/Evaluation:  Dietary intake, exercise, and body weight. Follow up in 3 months for 9 month post-op visit.

## 2021-03-29 ENCOUNTER — Encounter: Payer: Self-pay | Admitting: Family Medicine

## 2021-03-29 ENCOUNTER — Ambulatory Visit (INDEPENDENT_AMBULATORY_CARE_PROVIDER_SITE_OTHER): Payer: Managed Care, Other (non HMO) | Admitting: Family Medicine

## 2021-03-29 ENCOUNTER — Ambulatory Visit: Payer: Self-pay

## 2021-03-29 VITALS — BP 107/70 | Ht 69.0 in | Wt 196.0 lb

## 2021-03-29 DIAGNOSIS — M25511 Pain in right shoulder: Secondary | ICD-10-CM

## 2021-03-29 DIAGNOSIS — M7551 Bursitis of right shoulder: Secondary | ICD-10-CM

## 2021-03-29 MED ORDER — PREDNISONE 5 MG PO TABS: ORAL_TABLET | ORAL | 0 refills | Status: DC

## 2021-03-29 NOTE — Assessment & Plan Note (Signed)
Acutely occurring.  Has changes that the bursa as well as near the insertion of the biceps tendon. -Counseled on home exercise therapy and supportive care. -Prednisone. -Could consider physical therapy or injection or further imaging.

## 2021-03-29 NOTE — Patient Instructions (Signed)
Good to see you Please try heat before exercise and ice after  Please try massager on the area  Please try the exercises   Please send me a message in MyChart with any questions or updates.  Please see me back in 4 weeks.   --Dr. Jordan Likes

## 2021-03-29 NOTE — Progress Notes (Signed)
Maria Johnson - 41 y.o. female MRN [id number]  Date of birth: 01-09-1981  SUBJECTIVE:  Including CC & ROS.  No chief complaint on file.   Maria Johnson is a 41 y.o. female that is presenting with acute right shoulder pain.  Has had pain with intermittent movements.  Has started working out more.  Received a steroid injection in the same shoulder years ago.   Review of Systems See HPI   HISTORY: Past Medical, Surgical, Social, and Family History Reviewed & Updated per EMR.   Pertinent Historical Findings include:  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   Pes planus 10/22/2015   Pneumonia    Sickle cell trait (HCC)    Sleep apnea    has cpap   SOBOE (shortness of breath on exertion)    Sprain of medial collateral ligament of left knee 10/30/2018   Type 2 diabetes mellitus with hyperglycemia (HCC) 08/12/2013   Vitamin D deficiency 08/24/2017   Well adult exam 09/25/2017    Past Surgical History:  Procedure Laterality Date   ANKLE SURGERY  05/2003   torn cartilage  repair; left   ANTERIOR LUMBAR FUSION  01/06/2011   Procedure: ANTERIOR LUMBAR FUSION 1 LEVEL;  Surgeon: Dorian Heckle, MD;  Location: [facility];  Service: Neurosurgery;  Laterality: N/A;  Lumbar five-Sacral one  Anterior Lumbar Interbody Fusion with Instrumentation    BACK SURGERY     Laminectomy Sept 14, 2011,   DIAGNOSTIC LAPAROSCOPY  01/2002   removed scar  tissue   LAMINECTOMY AND MICRODISCECTOMY LUMBAR SPINE  10/2009   L4, L5, S1   LAPAROSCOPIC GASTRIC SLEEVE RESECTION N/A 12/08/2020   Procedure: LAPAROSCOPIC GASTRIC SLEEVE RESECTION;  Surgeon: Berna Bue, MD;  Location: [facility];  Service: General;  Laterality: N/A;   LUMBAR FUSION  01/06/2011   L5-S1   TOE SURGERY  07/05/2020   TONSILLECTOMY  03/2005   UPPER GI ENDOSCOPY N/A 12/08/2020   Procedure: UPPER GI ENDOSCOPY;  Surgeon: Berna Bue, MD;  Location: [facility];  Service: General;  Laterality: N/A;   VAGINAL HYSTERECTOMY  08/2007   partial; AUB     PHYSICAL EXAM:  VS: BP 107/70 (BP Location: Left Arm, Patient Position: Sitting)    Ht 5\' 9"  (1.753 m)    Wt 196 lb (88.9 kg)    LMP 08/09/2007    BMI 28.94 kg/m  Physical Exam Gen: NAD, alert, cooperative with exam, well-appearing MSK:  Neurovascularly intact    Limited ultrasound: Right shoulder:  Thickness at the ligaments as the biceps tendon courses over the humeral head. Mild thickness overlying the subscapularis. Mild supraspinatus bursitis  Summary: Supraspinatus bursitis  Ultrasound and interpretation by 10/10/2007, MD    ASSESSMENT & PLAN:   Subacromial bursitis of right shoulder joint Acutely occurring.  Has changes that the bursa as well as near the insertion of the biceps tendon. -Counseled on home exercise therapy and supportive care. -Prednisone. -Could consider physical therapy or injection or further imaging.

## 2021-04-05 ENCOUNTER — Other Ambulatory Visit: Payer: Self-pay | Admitting: Family Medicine

## 2021-04-05 DIAGNOSIS — E1169 Type 2 diabetes mellitus with other specified complication: Secondary | ICD-10-CM

## 2021-04-21 ENCOUNTER — Other Ambulatory Visit (HOSPITAL_BASED_OUTPATIENT_CLINIC_OR_DEPARTMENT_OTHER): Payer: Self-pay | Admitting: Obstetrics and Gynecology

## 2021-04-21 DIAGNOSIS — Z1231 Encounter for screening mammogram for malignant neoplasm of breast: Secondary | ICD-10-CM

## 2021-04-26 ENCOUNTER — Encounter: Payer: Self-pay | Admitting: Family Medicine

## 2021-04-26 ENCOUNTER — Ambulatory Visit (INDEPENDENT_AMBULATORY_CARE_PROVIDER_SITE_OTHER): Payer: Managed Care, Other (non HMO) | Admitting: Family Medicine

## 2021-04-26 ENCOUNTER — Ambulatory Visit: Payer: Self-pay

## 2021-04-26 VITALS — BP 110/88 | Ht 69.0 in | Wt 196.0 lb

## 2021-04-26 DIAGNOSIS — M7551 Bursitis of right shoulder: Secondary | ICD-10-CM

## 2021-04-26 MED ORDER — METHYLPREDNISOLONE ACETATE 40 MG/ML IJ SUSP
40.0000 mg | Freq: Once | INTRAMUSCULAR | Status: AC
  Administered 2021-04-26: 40 mg via INTRA_ARTICULAR

## 2021-04-26 NOTE — Patient Instructions (Signed)
Good to see you Please try ice as needed   Please send me a message in MyChart with any questions or updates.  Please see me back in 4 weeks or as needed if better.   --Dr. Lilianah Buffin  

## 2021-04-26 NOTE — Progress Notes (Signed)
?Maria Johnson - 41 y.o. female MRN [id number]  Date of birth: Sep 25, 1980 ? ?SUBJECTIVE:  Including CC & ROS.  ?No chief complaint on file. ? ? ?Maria Johnson is a 41 y.o. female that is presenting with worsening of her shoulder pain.  The pain has been ongoing for 4 weeks.  Pain is worse with overhead activities and with abduction. ? ? ?Review of Systems ?See HPI  ? ?HISTORY: Past Medical, Surgical, Social, and Family History Reviewed & Updated per EMR.   ?Pertinent Historical Findings include: ? ?Past Medical History:  ?Diagnosis Date  ? Acne   ? takes doxocycline daily for acne  ? Anemia   ? Anginal pain (HCC)   ? Anxiety   ? Atopic dermatitis 04/04/2018  ? Back pain   ? Bilateral carpal tunnel syndrome 04/04/2018  ? Bronchitis   ? history of  ? Carpal tunnel syndrome on both sides   ? Chicken pox   ? Depression   ? Depression   ? Diabetes mellitus   ? Generalized anxiety disorder 01/08/2014  ? Heart murmur   ? Hirsutism 08/07/2019  ? Hyperlipidemia   ? Insomnia 08/24/2017  ? Mixed hyperlipidemia 08/23/2019  ? OSA on CPAP 09/25/2017  ? 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 Pes planus 10/22/2015  ? Pneumonia   ? Sickle cell trait (HCC)   ? Sleep apnea   ? has cpap  ? SOBOE (shortness of breath on exertion)   ? Sprain of medial collateral ligament of left knee 10/30/2018  ? Type 2 diabetes mellitus with hyperglycemia (HCC) 08/12/2013  ? Vitamin D deficiency 08/24/2017  ? Well adult exam 09/25/2017  ? ? ?Past Surgical History:  ?Procedure Laterality Date  ? ANKLE SURGERY  05/2003  ? torn cartilage  repair; left  ? ANTERIOR LUMBAR FUSION  01/06/2011  ? Procedure: ANTERIOR LUMBAR FUSION 1 LEVEL;  Surgeon: Dorian Heckle, MD;  Location: [facility];  Service: Neurosurgery;  Laterality: N/A;  Lumbar five-Sacral one  Anterior Lumbar Interbody Fusion with Instrumentation   ? BACK SURGERY    ? Laminectomy Sept 14, 2011,  ? DIAGNOSTIC LAPAROSCOPY  01/2002  ? removed scar tissue  ? LAMINECTOMY AND  MICRODISCECTOMY LUMBAR SPINE  10/2009  ? L4, L5, S1  ? LAPAROSCOPIC GASTRIC SLEEVE RESECTION N/A 12/08/2020  ? Procedure: LAPAROSCOPIC GASTRIC SLEEVE RESECTION;  Surgeon: Berna Bue, MD;  Location: [facility];  Service: General;  Laterality: N/A;  ? LUMBAR FUSION  01/06/2011  ? L5-S1  ? TOE SURGERY  07/05/2020  ? TONSILLECTOMY  03/2005  ? UPPER GI ENDOSCOPY N/A 12/08/2020  ? Procedure: UPPER GI ENDOSCOPY;  Surgeon: Berna Bue, MD;  Location: [facility];  Service: General;  Laterality: N/A;  ? VAGINAL HYSTERECTOMY  08/2007  ? partial; AUB  ? ? ? ?PHYSICAL EXAM:  ?VS: BP 110/88 (BP Location: Left Arm, Patient Position: Sitting)   Ht 5\' 9"  (1.753 m)   Wt 196 lb (88.9 kg)   LMP 08/09/2007   BMI 28.94 kg/m?  ?Physical Exam ?Gen: NAD, alert, cooperative with exam, well-appearing ?MSK:  ?Neurovascularly intact   ? ? ?Aspiration/Injection Procedure Note ?[name] 10/10/2007 ?1980-09-22 ? ?Procedure: Injection ?Indications: Right shoulder pain ? ?Procedure Details ?Consent: Risks of procedure as well as the alternatives and risks of each were explained to the (patient/caregiver).  Consent for procedure obtained. ?Time Out: Verified patient identification, verified procedure, site/side was marked, verified correct patient position, special equipment/implants available, medications/allergies/relevent history reviewed, required  imaging and test results available.  Performed.  The area was cleaned with iodine and alcohol swabs.   ? ?The right subacromial space was injected using 1 cc's of 40 mg Depo-Medrol and 4 cc's of 0.25% bupivacaine with a 22 1 1/2" needle.  Ultrasound was used. Images were obtained in long views showing the injection.   ? ? ?A sterile dressing was applied. ? ?Patient did tolerate procedure well. ? ? ? ? ?ASSESSMENT & PLAN:  ? ?Subacromial bursitis of right shoulder joint ?Acutely worsening.  Having weakness and pain with abduction. ?-Counseled on home exercise therapy and supportive care. ?-Injection  today. ?-Could consider physical therapy or shockwave therapy or further imaging. ? ? ? ? ?

## 2021-04-26 NOTE — Assessment & Plan Note (Signed)
Acutely worsening.  Having weakness and pain with abduction. ?-Counseled on home exercise therapy and supportive care. ?-Injection today. ?-Could consider physical therapy or shockwave therapy or further imaging. ?

## 2021-05-17 ENCOUNTER — Encounter (HOSPITAL_BASED_OUTPATIENT_CLINIC_OR_DEPARTMENT_OTHER): Payer: Self-pay

## 2021-05-17 ENCOUNTER — Ambulatory Visit (HOSPITAL_BASED_OUTPATIENT_CLINIC_OR_DEPARTMENT_OTHER)
Admission: RE | Admit: 2021-05-17 | Discharge: 2021-05-17 | Disposition: A | Discharge status: Home / Self Care | Payer: Managed Care, Other (non HMO) | Source: Ambulatory Visit | Attending: Obstetrics and Gynecology | Admitting: Obstetrics and Gynecology

## 2021-05-17 DIAGNOSIS — Z1231 Encounter for screening mammogram for malignant neoplasm of breast: Secondary | ICD-10-CM | POA: Diagnosis present

## 2021-06-14 ENCOUNTER — Encounter: Payer: Managed Care, Other (non HMO) | Attending: Surgery | Admitting: Skilled Nursing Facility1

## 2021-06-14 ENCOUNTER — Encounter: Payer: Self-pay | Admitting: Skilled Nursing Facility1

## 2021-06-14 DIAGNOSIS — E669 Obesity, unspecified: Secondary | ICD-10-CM | POA: Diagnosis present

## 2021-06-14 DIAGNOSIS — E1165 Type 2 diabetes mellitus with hyperglycemia: Secondary | ICD-10-CM | POA: Diagnosis present

## 2021-06-14 NOTE — Progress Notes (Signed)
Follow-up visit:  Post-Operative sleeve Surgery ? ?Medical Nutrition Therapy:  Appt start time: 4:45pm end time:  5:20 pm ? ? ?Surgery date: 12/08/2020 ?Surgery type: sleeve gastrectomy ?Start weight at [facility]: 240.3 ?Weight today: 201.8 pounds ?Bowel Habits: Every day to every other day no complaints ?  ?Body Composition Scale 12/22/2020 02/02/2021 03/16/2021 06/14/2021  ?Current Body Weight 222.3 202.5 196 201.8  ?Total Body Fat % 38.4 35.3 34.2 35.3  ?Visceral Fat 10 8 8 8   ?Fat-Free Mass % 61.5 64.6 65.7 64.6  ? Total Body Water % 45.2 46.8 47.3 46.8  ?Muscle-Mass lbs 35.9 35.7 35.6 35.6  ?BMI 32.6 29.6 28.7 29.5  ?Body Fat Displacement      ?       Torso  lbs 52.9 44.2 41.5 44.0  ?       Left Leg  lbs 10.5 8.8 8.3 8.8  ?       Right Leg  lbs 10.5 8.8 8.3 8.8  ?       Left Arm  lbs 5.2 4.4 4.1 4.4  ?       Right Arm   lbs 5.2 4.4 4.1 4.4  ? ?Clinical  ?Medical hx: DM, hyperlipidemia  ?Medications: Rybelsus, metofrmin atorvastatin, spirolactone, xanax, vitamin D  ?Labs: A1C 6.7 ?Notable signs/symptoms: migraines ?Any previous deficiencies? Yes, vitamin D ? ? ?Pt states she has enjoyed the BELT program.  Pt states her last day is Wednesday. ?Pt states she obtained some work out equipment used at Friday and has incorporated the fitness at home. ?Pt states she stopped tracking food because it stressed her out. ?Pt states she has reflux about every other day.  Once at night. ?Pt states she was weaned off her antiacid medication. ?Pt states she is having some chest pressure. ?Pt states she has and appointment with surgeon this week. ?Pt states she has added a fiber supplement, stating it is a Metamucil capsule. ?Pt states she has broccoli everyday, cooked or raw.  Also states she eats greens. ?Pt states she will eat bananas, grapes, peaches ?Pt states she likes Great Grains cereal. ?Pt states she does not like the heat, and gets nauseous.  States the summer will be difficult to stay hydrated. ?Pt states she blood sugars  have been good, stating range is in the 90s. ? ?24-Hr Dietary Recall: ?First Meal: 2 eggs + 2 slices of bacon, broccoli  ?Snack:  100 calorie packs with almonds, walnuts ?Second Meal: usually fish ?Snack:   ?Third Meal: salad or wrap or cauliflower crust pizza ?Snack:  ?Beverages:  plain water, zero sugar lemonade ? ? ? ?Information Reviewed/ Discussed During Appointment: ?-Fluid requirements (64-100 ounces) ?-Protein requirements (60-80g) ?-Advancement of diet to include all complex carbohydrates ?-Inclusion of appropriate multivitamin and calcium supplements  ?-Exercise recommendations  ?-Alkaline water to help reduce reflux. ?-Moist foods for the summer to help with hydration. ? ?Fluid intake: adequate  ? ?Medications: See List ?Supplementation: appropriate  ? ? ?Using straws: no ?Drinking while eating: no ?Having you been chewing well: yes ?Chewing/swallowing difficulties: no ?Changes in vision: no ?Changes to mood/headaches: no ?Hair loss/Cahnges to skin/Changes to nails: no ?Any difficulty focusing or concentrating: no ?Sweating: no ?Dizziness/Lightheaded: no ?Palpitations: no  ?Carbonated beverages: no ?N/V/D/C/GAS: no ?Abdominal Pain: no ?Dumping syndrome: no ? ?Recent physical activity:  ADL's, BELT and exercises at home. ? ?Progress Towards Goal(s):  In Progress ?Teaching method utilized: Visual & Auditory  ?Demonstrated degree of understanding via: Teach Back  ?Readiness Level: Action ?Barriers  to learning/adherence to lifestyle change: none identified ? ?Handouts given during visit include: ?Fruit serving sizes ?Bariatric MyPlate ? ? ?Teaching Method Utilized:  ?Visual ?Auditory ?Hands on ? ?Demonstrated degree of understanding via:  Teach Back  ? ?Monitoring/Evaluation:  Dietary intake, exercise, and body weight. Follow up in October. ?

## 2021-06-21 ENCOUNTER — Ambulatory Visit (INDEPENDENT_AMBULATORY_CARE_PROVIDER_SITE_OTHER): Payer: Managed Care, Other (non HMO) | Admitting: Obstetrics & Gynecology

## 2021-06-21 ENCOUNTER — Encounter: Payer: Self-pay | Admitting: Obstetrics & Gynecology

## 2021-06-21 ENCOUNTER — Other Ambulatory Visit (HOSPITAL_COMMUNITY)
Admission: RE | Admit: 2021-06-21 | Discharge: 2021-06-21 | Disposition: A | Discharge status: Home / Self Care | Payer: Managed Care, Other (non HMO) | Source: Ambulatory Visit | Attending: Obstetrics & Gynecology | Admitting: Obstetrics & Gynecology

## 2021-06-21 VITALS — BP 119/75 | HR 78 | Ht 69.0 in | Wt 199.0 lb

## 2021-06-21 DIAGNOSIS — Z113 Encounter for screening for infections with a predominantly sexual mode of transmission: Secondary | ICD-10-CM

## 2021-06-21 DIAGNOSIS — Z01419 Encounter for gynecological examination (general) (routine) without abnormal findings: Secondary | ICD-10-CM

## 2021-06-21 NOTE — Progress Notes (Signed)
GYNECOLOGY ANNUAL PREVENTATIVE CARE ENCOUNTER NOTE  History:     Maria Johnson is a 41 y.o. [id number] female s/p TVH in 2009 for AUB here for a routine annual gynecologic exam.  Current complaints: none.  Desires annual STI screen. Denies abnormal vaginal bleeding, discharge, pelvic pain, problems with intercourse or other gynecologic concerns.    Gynecologic History Patient's last menstrual period was 08/09/2007. Contraception: status post hysterectomy Last Mammogram: 05/17/2021.  Result was normal  Obstetric History OB History  Gravida Para Term Preterm AB Living  3 3 3         SAB IAB Ectopic Multiple Live Births          3    # Outcome Date GA Lbr Len/2nd Weight Sex Delivery Anes PTL Lv  3 Term      Vag-Spont     2 Term      Vag-Spont     1 Term      Vag-Spont       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   Pes planus 10/22/2015   Pneumonia    Sickle cell trait (HCC)    Sleep apnea    has cpap   SOBOE (shortness of breath on exertion)    Sprain of medial collateral ligament of left knee 10/30/2018   Type 2 diabetes mellitus with hyperglycemia (HCC) 08/12/2013   Vitamin D deficiency 08/24/2017   Well adult exam 09/25/2017    Past Surgical History:  Procedure Laterality Date   ANKLE SURGERY  05/2003   torn cartilage  repair; left   ANTERIOR LUMBAR FUSION  01/06/2011   Procedure: ANTERIOR LUMBAR FUSION 1 LEVEL;   Surgeon: Dorian HeckleJoseph D Stern, MD;  Location: [facility];  Service: Neurosurgery;  Laterality: N/A;  Lumbar five-Sacral one  Anterior Lumbar Interbody Fusion with Instrumentation    BACK SURGERY     Laminectomy Sept 14, 2011,   DIAGNOSTIC LAPAROSCOPY  01/2002   removed scar tissue   LAMINECTOMY AND MICRODISCECTOMY LUMBAR SPINE  10/2009   L4, L5, S1   LAPAROSCOPIC GASTRIC SLEEVE RESECTION N/A 12/08/2020   Procedure: LAPAROSCOPIC GASTRIC SLEEVE RESECTION;  Surgeon: Berna Bueonnor, Chelsea A, MD;  Location: [facility];  Service: General;  Laterality: N/A;   LUMBAR FUSION  01/06/2011   L5-S1   TOE SURGERY  07/05/2020   TONSILLECTOMY  03/2005   UPPER GI ENDOSCOPY N/A 12/08/2020   Procedure: UPPER GI ENDOSCOPY;  Surgeon: Berna Bueonnor, Chelsea A, MD;  Location: [facility];  Service: General;  Laterality: N/A;   VAGINAL HYSTERECTOMY  08/2007   partial; AUB    Current Outpatient Medications on File Prior to Visit  Medication Sig Dispense Refill   atorvastatin (LIPITOR) 40 MG tablet TAKE 1 TABLET BY MOUTH EVERY DAY 90 tablet 1   Multiple Vitamins-Minerals (MULTIVITAMIN WITH  MINERALS) tablet Take 1 tablet by mouth daily.     ALPRAZolam (XANAX) 0.5 MG tablet TAKE 1 TABLET(0.5 MG) BY MOUTH DAILY AS NEEDED FOR ANXIETY (Patient not taking: Reported on 06/21/2021) 30 tablet 2   predniSONE (DELTASONE) 5 MG tablet Take 6 pills for first day, 5 pills second day, 4 pills third day, 3 pills fourth day, 2 pills the fifth day, and 1 pill sixth day. (Patient not taking: Reported on 06/21/2021) 21 tablet 0   No current facility-administered medications on file prior to visit.    No Known Allergies  Social History:  reports that she has never smoked. She has never used smokeless tobacco. She reports that she does not currently use alcohol. She reports that she does not use drugs.  Family History  Problem Relation Age of Onset   Miscarriages / Stillbirths Mother    Hypertension Mother    Alcohol abuse Father    Depression Father     Drug abuse Father    Hyperlipidemia Father    Stroke Father    Anxiety disorder Father    Sleep apnea Father    Alcoholism Father    Depression Daughter    Diabetes Daughter    Hypertension Daughter    Miscarriages / [place] Daughter    Birth defects Son    Diabetes Maternal Grandmother    Heart attack Maternal Grandmother    Hypertension Maternal Grandmother    Stroke Maternal Grandmother    Diabetes Maternal Grandfather    Hypertension Maternal Grandfather    Diabetes Paternal Grandmother    Lupus Paternal Grandmother     The following portions of the patient's history were reviewed and updated as appropriate: allergies, current medications, past family history, past medical history, past social history, past surgical history and problem list.  Review of Systems Pertinent items noted in HPI and remainder of comprehensive ROS otherwise negative.  Physical Exam:  BP 119/75   Pulse 78   Ht 5\' 9"  (1.753 m)   Wt 199 lb (90.3 kg)   LMP 08/09/2007   BMI 29.39 kg/m  CONSTITUTIONAL: Well-developed, well-nourished female in no acute distress.  HENT:  Normocephalic, atraumatic, External right and left ear normal.  EYES: Conjunctivae and EOM are normal. Pupils are equal, round, and reactive to light. No scleral icterus.  NECK: Normal range of motion, supple, no masses.  Normal thyroid.  SKIN: Skin is warm and dry. No rash noted. Not diaphoretic. No erythema. No pallor. MUSCULOSKELETAL: Normal range of motion. No tenderness.  No cyanosis, clubbing, or edema. NEUROLOGIC: Alert and oriented to person, place, and time. Normal reflexes, muscle tone coordination.  PSYCHIATRIC: Normal mood and affect. Normal behavior. Normal judgment and thought content. CARDIOVASCULAR: Normal heart rate noted, regular rhythm RESPIRATORY: Clear to auscultation bilaterally. Effort and breath sounds normal, no problems with respiration noted. BREASTS: Symmetric in size. No masses, tenderness, skin  changes, nipple drainage, or lymphadenopathy bilaterally. Performed in the presence of a chaperone. ABDOMEN: Soft, no distention noted.  No tenderness, rebound or guarding.  PELVIC: Normal appearing external genitalia and urethral meatus; normal appearing vaginal mucosa and cuff.  Scant white vaginal discharge noted, testing sample obtained.  Surgically absent uterus, no other palpable masses, no adnexal tenderness.  Performed in the presence of a chaperone.   Assessment and Plan:     Routine screening for STI (sexually transmitted infection) STI screen done, will follow up results and manage accordingly. - RPR+HBsAg+HCVAb+HIV - Cervicovaginal ancillary only  2. Well woman exam with routine  gynecological exam Normal exam, no concerns.  She is s/p TVH for benign reasons, no need for pap smears Mammogram is up to date. Routine preventative health maintenance measures emphasized. Please refer to After Visit Summary for other counseling recommendations.      Jaynie Collins, MD, FACOG Obstetrician & Gynecologist, [facility] for [employer], [facility]

## 2021-06-22 LAB — RPR+HBSAG+HCVAB+...
HIV Screen 4th Generation wRfx: NONREACTIVE
Hep C Virus Ab: NONREACTIVE
Hepatitis B Surface Ag: NEGATIVE
RPR Ser Ql: NONREACTIVE

## 2021-06-22 LAB — CERVICOVAGINAL ANCILLARY ONLY
Chlamydia: NEGATIVE
Comment: NEGATIVE
Comment: NEGATIVE
Comment: NORMAL
Neisseria Gonorrhea: NEGATIVE
Trichomonas: NEGATIVE

## 2021-06-29 ENCOUNTER — Ambulatory Visit (INDEPENDENT_AMBULATORY_CARE_PROVIDER_SITE_OTHER): Payer: Managed Care, Other (non HMO) | Admitting: Family Medicine

## 2021-06-29 ENCOUNTER — Encounter: Payer: Self-pay | Admitting: Family Medicine

## 2021-06-29 VITALS — BP 110/80 | HR 83 | Temp 98.0°F | Ht 69.0 in | Wt 203.1 lb

## 2021-06-29 DIAGNOSIS — Z0000 Encounter for general adult medical examination without abnormal findings: Secondary | ICD-10-CM

## 2021-06-29 DIAGNOSIS — E1169 Type 2 diabetes mellitus with other specified complication: Secondary | ICD-10-CM | POA: Diagnosis not present

## 2021-06-29 DIAGNOSIS — E785 Hyperlipidemia, unspecified: Secondary | ICD-10-CM | POA: Diagnosis not present

## 2021-06-29 DIAGNOSIS — D573 Sickle-cell trait: Secondary | ICD-10-CM

## 2021-06-29 DIAGNOSIS — E669 Obesity, unspecified: Secondary | ICD-10-CM

## 2021-06-29 MED ORDER — ATORVASTATIN CALCIUM 40 MG PO TABS: 40.0000 mg | ORAL_TABLET | Freq: Every day | ORAL | 3 refills | Status: DC

## 2021-06-29 MED ORDER — CLOBETASOL PROPIONATE 0.05 % EX CREA: 1.0000 "application " | TOPICAL_CREAM | Freq: Two times a day (BID) | CUTANEOUS | 0 refills | Status: DC

## 2021-06-29 NOTE — Progress Notes (Signed)
Chief Complaint  Patient presents with   Follow-up    6 month      Well Woman Maria Johnson is here for a complete physical.   Her last physical was >1 year ago.  Current diet: in general, a "healthy" diet. Current exercise: lifting weights, cardio. Weight is intentionally losing after bariatric surgery and she denies fatigue out of ordinary. Seatbelt? Yes Advanced directive? No  Health Maintenance Pap/HPV- Yes Mammogram- Yes Tetanus- Yes Hep C screening- Yes HIV screening- Yes  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   Pes planus 10/22/2015   Pneumonia    Sickle cell trait (HCC)    Sleep apnea    has cpap   SOBOE (shortness of breath on exertion)    Sprain of medial collateral ligament of left knee 10/30/2018   Type 2 diabetes mellitus with hyperglycemia (HCC) 08/12/2013   Vitamin D deficiency 08/24/2017   Well adult exam 09/25/2017     Past Surgical History:  Procedure Laterality Date   ANKLE SURGERY  05/2003   torn cartilage  repair; left   ANTERIOR LUMBAR FUSION  01/06/2011   Procedure: ANTERIOR LUMBAR FUSION 1 LEVEL;  Surgeon: Dorian Heckle, MD;  Location: [facility];  Service: Neurosurgery;  Laterality: N/A;  Lumbar five-Sacral one  Anterior Lumbar Interbody Fusion with Instrumentation    BACK SURGERY     Laminectomy Sept 14, 2011,   DIAGNOSTIC LAPAROSCOPY  01/2002   removed scar tissue   LAMINECTOMY AND  MICRODISCECTOMY LUMBAR SPINE  10/2009   L4, L5, S1   LAPAROSCOPIC GASTRIC SLEEVE RESECTION N/A 12/08/2020   Procedure: LAPAROSCOPIC GASTRIC SLEEVE RESECTION;  Surgeon: Berna Bue, MD;  Location: [facility];  Service: General;  Laterality: N/A;   LUMBAR FUSION  01/06/2011   L5-S1   TOE SURGERY  07/05/2020   TONSILLECTOMY  03/2005   UPPER GI ENDOSCOPY N/A 12/08/2020   Procedure: UPPER GI ENDOSCOPY;  Surgeon: Berna Bue, MD;  Location: [facility];  Service: General;  Laterality: N/A;   VAGINAL HYSTERECTOMY  08/2007   partial; AUB    Medications  Current Outpatient Medications on File Prior to Visit  Medication Sig Dispense Refill   atorvastatin (LIPITOR) 40 MG tablet TAKE 1 TABLET BY MOUTH EVERY DAY 90 tablet 1   Multiple Vitamins-Minerals (MULTIVITAMIN WITH MINERALS) tablet Take 1 tablet by mouth daily.     Allergies No Known Allergies  Review of Systems: Constitutional:  no unexpected weight changes Eye:  no recent significant change in vision Ear/Nose/Mouth/Throat:  Ears:  no recent change in hearing Nose/Mouth/Throat:  no complaints of nasal congestion, no sore throat Cardiovascular: no chest pain Respiratory:  no shortness of breath Gastrointestinal:  no abdominal pain, no change in bowel habits GU:  Female: negative for dysuria or pelvic pain Musculoskeletal/Extremities:  no pain of the joints Integumentary (Skin/Breast):  no abnormal skin lesions reported Neurologic:  no headaches Endocrine:  denies fatigue Hematologic/Lymphatic:  No areas of easy bleeding  Exam BP 110/80   Pulse 83   Temp 98 F (36.7 C) (Oral)   Ht 5\' 9"  (1.753 m)   Wt 203 lb 2 oz (92.1 kg)   LMP 08/09/2007   SpO2 98%   BMI 30.00 kg/m  General:  well developed, well nourished, in no apparent distress Skin:  no significant moles, warts, or growths Head:  no masses, lesions, or tenderness Eyes:  pupils equal and round, sclera anicteric without injection Ears:  canals without lesions, TMs  shiny without retraction, no obvious effusion, no erythema Nose:  nares patent, septum midline, mucosa normal, and no drainage or sinus tenderness Throat/Pharynx:  lips and gingiva without lesion; tongue and uvula midline; non-inflamed pharynx; no exudates or postnasal drainage Neck: neck supple without adenopathy, thyromegaly, or masses Lungs:  clear to auscultation, breath sounds equal bilaterally, no respiratory distress Cardio:  regular rate and rhythm, no LE edema Abdomen:  abdomen soft, nontender; bowel sounds normal; no masses or organomegaly Genital: Defer to GYN Musculoskeletal:  symmetrical muscle groups noted without atrophy or deformity Extremities:  no clubbing, cyanosis, or edema, no deformities, no skin discoloration Neuro:  gait normal; deep tendon reflexes normal and symmetric Psych: well oriented with normal range of affect and appropriate judgment/insight  Assessment and Plan  Well adult exam - Plan: CBC, Comprehensive metabolic panel, Lipid panel  Hyperlipidemia associated with type 2 diabetes mellitus (HCC) - Plan: Hemoglobin A1c, Microalbumin / creatinine urine ratio  Diabetes mellitus type 2 in obese (HCC) - Plan: atorvastatin (LIPITOR) 40 MG tablet  Sickle cell trait (HCC), Chronic   Well 41 y.o. female. Counseled on diet and exercise. Other orders as above. Advanced directive form provided today.  Needs to sched eye exam.  May not need to go back on metformin, will see what A1c is.  Follow up in 6 mo or prn. The patient voiced understanding and agreement to the plan.  [address] [place], DO 06/29/21 4:18 PM

## 2021-06-29 NOTE — Patient Instructions (Addendum)
Give [place] 2-3 business days to get the results of your labs back.   Keep the diet clean and stay active.  Please get me a copy of your advanced directive form at your convenience.   Please schedule your eye exam at your convenience.   Let us know if you need anything.

## 2021-06-30 LAB — CBC
HCT: 38.4 % (ref 36.0–46.0)
Hemoglobin: 12.6 g/dL (ref 12.0–15.0)
MCHC: 32.9 g/dL (ref 30.0–36.0)
MCV: 83.5 fl (ref 78.0–100.0)
Platelets: 318 10*3/uL (ref 150.0–400.0)
RBC: 4.6 Mil/uL (ref 3.87–5.11)
RDW: 14.3 % (ref 11.5–15.5)
WBC: 6.2 10*3/uL (ref 4.0–10.5)

## 2021-06-30 LAB — COMPREHENSIVE METABOLIC PANEL
ALT: 22 U/L (ref 0–35)
AST: 24 U/L (ref 0–37)
Albumin: 4.3 g/dL (ref 3.5–5.2)
Alkaline Phosphatase: 54 U/L (ref 39–117)
BUN: 13 mg/dL (ref 6–23)
CO2: 27 mEq/L (ref 19–32)
Calcium: 9.8 mg/dL (ref 8.4–10.5)
Chloride: 102 mEq/L (ref 96–112)
Creatinine, Ser: 1.05 mg/dL (ref 0.40–1.20)
GFR: 66.17 mL/min (ref >60.00)
Glucose, Bld: 84 mg/dL (ref 70–99)
Potassium: 3.9 mEq/L (ref 3.5–5.1)
Sodium: 137 mEq/L (ref 135–145)
Total Bilirubin: 0.3 mg/dL (ref 0.2–1.2)
Total Protein: 7.2 g/dL (ref 6.0–8.3)

## 2021-06-30 LAB — LIPID PANEL
Cholesterol: 142 mg/dL (ref 0–200)
HDL: 54.4 mg/dL (ref >39.00)
LDL Cholesterol: 73 mg/dL (ref 0–99)
NonHDL: 87.15
Total CHOL/HDL Ratio: 3
Triglycerides: 73 mg/dL (ref 0.0–149.0)
VLDL: 14.6 mg/dL (ref 0.0–40.0)

## 2021-06-30 LAB — HEMOGLOBIN A1C: Hgb A1c MFr Bld: 6.4 % (ref 4.6–6.5)

## 2021-07-06 ENCOUNTER — Other Ambulatory Visit (INDEPENDENT_AMBULATORY_CARE_PROVIDER_SITE_OTHER): Payer: Managed Care, Other (non HMO)

## 2021-07-06 DIAGNOSIS — E785 Hyperlipidemia, unspecified: Secondary | ICD-10-CM

## 2021-07-06 DIAGNOSIS — E1169 Type 2 diabetes mellitus with other specified complication: Secondary | ICD-10-CM | POA: Diagnosis not present

## 2021-07-06 LAB — MICROALBUMIN / CREATININE URINE RATIO
Creatinine,U: 104.3 mg/dL
Microalb Creat Ratio: 0.7 mg/g (ref 0.0–30.0)
Microalb, Ur: <0.7 mg/dL (ref 0.0–1.9)

## 2021-07-06 NOTE — Addendum Note (Signed)
Addended by: Kelle Darting A on: 07/06/2021 07:48 AM   Modules accepted: Orders

## 2021-08-12 ENCOUNTER — Other Ambulatory Visit: Payer: Self-pay | Admitting: Family Medicine

## 2021-08-17 ENCOUNTER — Encounter: Payer: Self-pay | Admitting: Family Medicine

## 2021-08-17 ENCOUNTER — Telehealth: Payer: Self-pay | Admitting: Family Medicine

## 2021-08-17 ENCOUNTER — Other Ambulatory Visit: Payer: Self-pay | Admitting: Family Medicine

## 2021-08-17 ENCOUNTER — Telehealth (INDEPENDENT_AMBULATORY_CARE_PROVIDER_SITE_OTHER): Payer: Managed Care, Other (non HMO) | Admitting: Family Medicine

## 2021-08-17 DIAGNOSIS — G43109 Migraine with aura, not intractable, without status migrainosus: Secondary | ICD-10-CM

## 2021-08-17 DIAGNOSIS — G43909 Migraine, unspecified, not intractable, without status migrainosus: Secondary | ICD-10-CM | POA: Insufficient documentation

## 2021-08-17 MED ORDER — KETOROLAC TROMETHAMINE 60 MG/2ML IM SOLN
60.0000 mg | Freq: Once | INTRAMUSCULAR | Status: AC
  Administered 2021-08-17: 60 mg via INTRAMUSCULAR

## 2021-08-17 MED ORDER — SUMATRIPTAN SUCCINATE 100 MG PO TABS: 100.0000 mg | ORAL_TABLET | ORAL | 2 refills | Status: DC | PRN

## 2021-08-17 MED ORDER — SUMATRIPTAN SUCCINATE 100 MG PO TABS: ORAL_TABLET | ORAL | 2 refills | Status: DC

## 2021-08-17 MED ORDER — ONDANSETRON 4 MG PO TBDP: 4.0000 mg | ORAL_TABLET | Freq: Three times a day (TID) | ORAL | 0 refills | Status: DC | PRN

## 2021-08-17 NOTE — Telephone Encounter (Signed)
[facility] triage nurse stated that patient had migraine and needed to be seen in 4 hours.  He transferred pt over and we do not have any openings today.  She has been to urgent care and has been having a migraine for 3 days now.  She did schedule for tomorrow at 9am.  Is there any way we can squeeze her in today or maybe call in some meds for her?

## 2021-08-17 NOTE — Progress Notes (Signed)
Chief Complaint  Patient presents with   Migraine     Maria Johnson is a 41 y.o. female here for evaluation of an acute headache. Due to COVID-19 pandemic, we are interacting via web portal for an electronic face-to-face visit. I verified patient's ID using 2 identifiers. Patient agreed to proceed with visit via this method. Patient is at home, I am at office. Patient and I are present for visit.   Duration: 3 days Woke up with this headache.  Used to have aura, a strange taste/smell. Sometimes can see a funny light around her eyes.  Laterality: bilateral front Quality: aching Severity: "15"/10 Associated symptoms: nausea, vomiting, light sensitivity Therapies tried: Tylenol, Advil, Excedrin Hx of migraines: Yes.   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 If you have any questions, ask your nurse or doctor.          atorvastatin 40 MG tablet Commonly known as: LIPITOR Take 1 tablet  (40 mg total) by mouth daily.   clobetasol cream 0.05 % Commonly known as: TEMOVATE Apply 1 application. topically 2 (two) times daily.   multivitamin with minerals tablet Take 1 tablet by mouth daily.   ondansetron 4 MG disintegrating tablet Commonly known as: ZOFRAN-ODT Take 1 tablet (4 mg total) by mouth every 8 (eight) hours as needed for nausea or vomiting. Started by: Sharlene Dory, DO   SUMAtriptan 100 MG tablet Commonly known as: Imitrex Take 1 tablet (100 mg total) by mouth every 2 (two) hours as needed for migraine. May repeat in 2 hours if headache persists or recurs. Started by: Sharlene Dory, DO       Objective No conversational dyspnea Age appropriate judgment and insight Nml affect and mood  Migraine with aura and without status migrainosus, not intractable - Plan: ondansetron (ZOFRAN-ODT) 4 MG disintegrating tablet, SUMAtriptan (IMITREX) 100 MG tablet  Exacerbation of chronic issue. Will send in triptan for future loose, she will come here for Toradol injection.  Zofran as needed for nausea.  If headaches become more frequent, we will consider prophylactic. F/u prn. The pt voiced understanding and agreement to the plan.  Jilda Roche [place], DO 08/17/21 11:54 AM

## 2021-08-17 NOTE — Telephone Encounter (Signed)
Nurse Assessment Nurse: Burman Blacksmith, RN, Kathlene November Date/Time (Eastern Time): 08/17/2021 10:30:26 AM Confirm and document reason for call. If symptomatic, describe symptoms. ---Caller states she has a L side optical migraine X 3 days. No Rx for them. nausea vomited X 1 yesterday. no other s/s Does the patient have any new or worsening symptoms? ---Yes Will a triage be completed? ---Yes Related visit to physician within the last 2 weeks? ---No Does the PT have any chronic conditions? (i.e. diabetes, asthma, this includes High risk factors for pregnancy, etc.) ---Yes List chronic conditions. ---Migraines, Dm, Is the patient pregnant or possibly pregnant? (Ask all females between the ages of 61-55) ---No Is this a behavioral health or substance abuse call? ---No Guidelines Guideline Title Affirmed Question Affirmed Notes Nurse Date/Time (Eastern Time) Headache [1] SEVERE headache (e.g., excruciating) AND [2] not improved after 2 hours of pain medicine Emch, RN, Kathlene November 08/17/2021 10:33:20 AM PLEASE NOTE: All timestamps contained within this report are represented as [place] Standard Time. CONFIDENTIALTY NOTICE: This fax transmission is intended only for the addressee. It contains information that is legally privileged, confidential or otherwise protected from use or disclosure. If you are not the intended recipient, you are strictly prohibited from reviewing, disclosing, copying using or disseminating any of this information or taking any action in reliance on or regarding this information. If you have received this fax in error, please notify us immediately by telephone so that we can arrange for its return to [place]. Phone: [phone], Toll-Free: [phone], Fax: [phone] Page: 2 of 2 Call Id: [id number] Disp. Time Lamount Cohen Time) Disposition Final User 08/17/2021 10:38:53 AM See HCP within 4 Hours (or PCP triage) Yes Emch, RN, Kathlene November Final Disposition 08/17/2021 10:38:53 AM See HCP within 4  Hours (or PCP triage) Yes Emch, RN, Rosalita Levan Disagree/Comply Comply Caller Understands Yes PreDisposition Did not know what to do Care Advice Given Per Guideline SEE HCP (OR PCP TRIAGE) WITHIN 4 HOURS: * IF OFFICE WILL BE OPEN: You need to be seen within the next 3 or 4 hours. Call your doctor (or NP/PA) now or as soon as the office opens. * ACETAMINOPHEN - REGULAR STRENGTH TYLENOL: Take 650 mg (two 325 mg pills) by mouth every 4 to 6 hours as needed. Each Regular Strength Tylenol pill has 325 mg of acetaminophen. The most you should take is 10 pills a day (3,250 mg total). Note: In [place], the maximum is 12 pills a day (3,900 mg total). * IBUPROFEN (E.G., MOTRIN, ADVIL): Take 400 mg (two 200 mg pills) by mouth every 6 hours. The most you should take is 6 pills a day (1,200 mg total). ANOTHER ADULT SHOULD DRIVE: * It is better and safer if another adult drives instead of you. CALL BACK IF: * You become worse Comments User: Lenon Ahmadi, RN Date/Time Lamount Cohen Time): 08/17/2021 10:42:06 AM Warm transfer to office for appt Referrals REFERRED TO PCP OFFICE

## 2021-08-17 NOTE — Telephone Encounter (Signed)
Pt was called to go over symptoms dictated in appt for 7.19.23. Pt stated she was experiencing the following symptoms:  -Migrane level headaches and nausea starting when wakes up and lasting till she goes to sleep  -Tylenol, Excedrin, and Advil are ineffective in treating headache  -Anti-nauesa meds ineffective in treating nausea  -Vomited a few times with no abnormality in vomit  Pt was transferred to triage nurse for further eval.

## 2021-08-17 NOTE — Telephone Encounter (Signed)
Spoke to PCP and was ok'd to put on our schedule today at 11:30 for a Video Visit. Canceled tomorrow's appt.

## 2021-08-17 NOTE — Addendum Note (Signed)
Addended by: Scharlene Gloss B on: 08/17/2021 12:43 PM   Modules accepted: Orders

## 2021-08-18 ENCOUNTER — Telehealth: Payer: Managed Care, Other (non HMO) | Admitting: Family Medicine

## 2021-09-08 ENCOUNTER — Encounter (INDEPENDENT_AMBULATORY_CARE_PROVIDER_SITE_OTHER): Payer: Self-pay

## 2021-09-13 ENCOUNTER — Encounter: Payer: Self-pay | Admitting: Family Medicine

## 2021-09-13 ENCOUNTER — Other Ambulatory Visit: Payer: Self-pay | Admitting: Family Medicine

## 2021-09-13 DIAGNOSIS — Z9889 Other specified postprocedural states: Secondary | ICD-10-CM

## 2021-10-18 ENCOUNTER — Other Ambulatory Visit (HOSPITAL_COMMUNITY)
Admission: RE | Admit: 2021-10-18 | Discharge: 2021-10-18 | Disposition: A | Discharge status: Home / Self Care | Payer: Managed Care, Other (non HMO) | Source: Ambulatory Visit | Attending: Obstetrics and Gynecology | Admitting: Obstetrics and Gynecology

## 2021-10-18 ENCOUNTER — Ambulatory Visit: Payer: Managed Care, Other (non HMO)

## 2021-10-18 VITALS — Wt 211.0 lb

## 2021-10-18 DIAGNOSIS — N898 Other specified noninflammatory disorders of vagina: Secondary | ICD-10-CM

## 2021-10-18 NOTE — Progress Notes (Signed)
SUBJECTIVE:  41 y.o. female complains of white vaginal discharge for 5 day(s). Denies abnormal vaginal bleeding or significant pelvic pain or fever. Patient ok with STI screening.No UTI symptoms. Denies history of known exposure to STD.  Patient's last menstrual period was 08/09/2007.  OBJECTIVE:  She appears well, afebrile. Urine dipstick: not done.  ASSESSMENT:  Vaginal Discharge     PLAN:  GC, chlamydia, trichomonas, BVAG, CVAG probe sent to lab. Treatment: To be determined once lab results are received ROV prn if symptoms persist or worsen.   Maria Johnson l Dorsey Authement, CMA

## 2021-10-19 ENCOUNTER — Ambulatory Visit (INDEPENDENT_AMBULATORY_CARE_PROVIDER_SITE_OTHER): Payer: Managed Care, Other (non HMO) | Admitting: Plastic Surgery

## 2021-10-19 ENCOUNTER — Encounter: Payer: Self-pay | Admitting: Plastic Surgery

## 2021-10-19 VITALS — BP 124/81 | HR 78 | Ht 65.5 in | Wt 212.2 lb

## 2021-10-19 DIAGNOSIS — M793 Panniculitis, unspecified: Secondary | ICD-10-CM | POA: Diagnosis not present

## 2021-10-19 DIAGNOSIS — R21 Rash and other nonspecific skin eruption: Secondary | ICD-10-CM | POA: Diagnosis not present

## 2021-10-19 DIAGNOSIS — G4730 Sleep apnea, unspecified: Secondary | ICD-10-CM

## 2021-10-19 DIAGNOSIS — E1169 Type 2 diabetes mellitus with other specified complication: Secondary | ICD-10-CM

## 2021-10-19 DIAGNOSIS — F411 Generalized anxiety disorder: Secondary | ICD-10-CM

## 2021-10-19 DIAGNOSIS — Z9884 Bariatric surgery status: Secondary | ICD-10-CM | POA: Diagnosis not present

## 2021-10-19 NOTE — Progress Notes (Signed)
Patient ID: Maria Johnson, female    DOB: 07-16-1980, 41 y.o.   MRN: [id number]   Chief Complaint  Patient presents with   Advice Only   Skin Problem    The patient is a 41 year old evaluation of her abdomen on.  She is 5 feet 5 inches tall and weighs 212 pounds.  She weighs 250 pounds a year ago.  She had bariatric surgery by Dr. Windle Guard with sleeve in November 2022.  She has lost 40 pounds since.  Her goal is to lose 20 to 30 pounds more.  She has been really good about watching her diet and exercise.  She was a diabetic prior to the surgery and has since cleared.  She has a history of hyperlipidemia, depression, sickle cell trait, and sleep apnea.  Her last hemoglobin A1c was in May and was 6.4.  She has strong abdominal muscles and no sign of a hernia.  She complains of rashes and skin breakdown in her folds.    Review of Systems  Constitutional:  Positive for activity change. Negative for appetite change.  Eyes: Negative.   Respiratory: Negative.  Negative for chest tightness and wheezing.   Cardiovascular: Negative.   Gastrointestinal: Negative.   Endocrine: Negative.   Genitourinary: Negative.   Musculoskeletal: Negative.   Skin:  Positive for rash.  Hematological: Negative.     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   Pes planus 10/22/2015   Pneumonia    Sickle cell trait (HCC)    Sleep apnea    has cpap   SOBOE  (shortness of breath on exertion)    Sprain of medial collateral ligament of left knee 10/30/2018   Type 2 diabetes mellitus with hyperglycemia ([place]) 08/12/2013   Vitamin D deficiency 08/24/2017   Well adult exam 09/25/2017    Past Surgical History:  Procedure Laterality Date   ANKLE SURGERY  05/2003   torn cartilage  repair; left   ANTERIOR LUMBAR FUSION  01/06/2011   Procedure: ANTERIOR LUMBAR FUSION 1 LEVEL;  Surgeon: Peggyann Shoals, MD;  Location: [place] [facility];  Service: Neurosurgery;  Laterality: N/A;  Lumbar five-Sacral one  Anterior Lumbar Interbody Fusion with Instrumentation    BACK SURGERY     Laminectomy Sept 14, 2011,   DIAGNOSTIC LAPAROSCOPY  01/2002   removed scar tissue   LAMINECTOMY AND MICRODISCECTOMY LUMBAR SPINE  10/2009   L4, L5, S1   LAPAROSCOPIC GASTRIC SLEEVE RESECTION N/A 12/08/2020   Procedure: LAPAROSCOPIC GASTRIC SLEEVE RESECTION;  Surgeon: Clovis Riley, MD;  Location: [facility];  Service: General;  Laterality: N/A;   LUMBAR FUSION  01/06/2011   L5-S1   TOE SURGERY  07/05/2020   TONSILLECTOMY  03/2005   UPPER GI ENDOSCOPY N/A 12/08/2020   Procedure: UPPER GI ENDOSCOPY;  Surgeon: Berna Bue, MD;  Location: [facility];  Service: General;  Laterality: N/A;   VAGINAL HYSTERECTOMY  08/2007   partial; AUB      Current Outpatient Medications:    atorvastatin (LIPITOR) 40 MG tablet, Take 1 tablet (40 mg total) by mouth daily., Disp: 90 tablet, Rfl: 3   clobetasol cream (TEMOVATE) 0.05 %, Apply 1 application. topically 2 (two) times daily., Disp: 30 g, Rfl: 0   Multiple Vitamins-Minerals (MULTIVITAMIN WITH MINERALS) tablet, Take 1 tablet by mouth daily., Disp: , Rfl:    ondansetron (ZOFRAN-ODT) 4 MG disintegrating tablet, Take 1 tablet (4 mg total) by mouth every 8 (eight) hours as needed for nausea or vomiting., Disp: 20 tablet, Rfl: 0   SUMAtriptan (IMITREX) 100 MG tablet, Take 1 tab daily as needed for migraines. May repeat in 2 hours if headache persists  or recurs., Disp: 10 tablet, Rfl: 2   Objective:   Vitals:   10/19/21 0931  BP: 124/81  Pulse: 78  SpO2: 99%    Physical Exam Vitals reviewed.  Constitutional:      Appearance: Normal appearance.  HENT:     Head: Normocephalic and atraumatic.  Cardiovascular:     Rate and Rhythm: Normal rate.     Pulses: Normal pulses.  Pulmonary:     Effort: Pulmonary effort is normal.  Abdominal:     General: There is no distension.     Palpations: Abdomen is soft. There is no mass.     Tenderness: There is no abdominal tenderness.     Hernia: No hernia is present.  Musculoskeletal:        General: No swelling or deformity.  Skin:    General: Skin is warm.     Capillary Refill: Capillary refill takes less than 2 seconds.     Coloration: Skin is not jaundiced.  Neurological:     General: No focal deficit present.     Mental Status: She is alert.  Psychiatric:        Mood and Affect: Mood normal.        Behavior: Behavior normal.        Thought Content: Thought content normal.        Judgment: Judgment normal.     Assessment & Plan:  Type 2 diabetes mellitus with other specified complication, without long-term current use of insulin (HCC)  Sleep apnea, unspecified type  Generalized anxiety disorder  Panniculitis  The patient is a good candidate for a panniculectomy.  However I strongly recommend that she achieve her goal weight prior to surgery to have the best result.  Patient thinks that she would likely be able to do this by December or January.  We will plan to see her back in January.  Pictures were obtained of the patient and placed in the chart with the patient's or guardian's permission.   Alena Bills Jadalyn Oliveri, DO

## 2021-10-20 LAB — CERVICOVAGINAL ANCILLARY ONLY
Bacterial Vaginitis (gardnerella): NEGATIVE
Candida Glabrata: NEGATIVE
Candida Vaginitis: NEGATIVE
Chlamydia: NEGATIVE
Comment: NEGATIVE
Comment: NEGATIVE
Comment: NEGATIVE
Comment: NEGATIVE
Comment: NEGATIVE
Comment: NORMAL
Neisseria Gonorrhea: NEGATIVE
Trichomonas: NEGATIVE

## 2021-11-05 ENCOUNTER — Other Ambulatory Visit: Payer: Self-pay | Admitting: Family Medicine

## 2021-11-05 ENCOUNTER — Encounter: Payer: Self-pay | Admitting: Family Medicine

## 2021-11-05 MED ORDER — ALPRAZOLAM 0.5 MG PO TABS: 0.5000 mg | ORAL_TABLET | Freq: Every day | ORAL | 2 refills | Status: DC | PRN

## 2021-11-15 ENCOUNTER — Encounter: Payer: Managed Care, Other (non HMO) | Attending: Family Medicine | Admitting: Dietician

## 2021-11-15 ENCOUNTER — Encounter: Payer: Self-pay | Admitting: Dietician

## 2021-11-15 DIAGNOSIS — E669 Obesity, unspecified: Secondary | ICD-10-CM | POA: Diagnosis not present

## 2021-11-15 DIAGNOSIS — E1169 Type 2 diabetes mellitus with other specified complication: Secondary | ICD-10-CM | POA: Insufficient documentation

## 2021-11-15 NOTE — Progress Notes (Signed)
Follow-up visit:  Post-Operative sleeve Surgery  Medical Nutrition Therapy:  Appt start time: 4:45pm end time:  5:25 pm   Surgery date: 12/08/2020 Surgery type: sleeve gastrectomy Start weight at [facility]: 240.3 Weight today: 214.6 pounds Bowel Habits: Every day to every other day no complaints   Body Composition Scale 12/22/2020 02/02/2021 03/16/2021 06/14/2021 11/15/2021  Current Body Weight 222.3 202.5 196 201.8 214.6  Total Body Fat % 38.4 35.3 34.2 35.3 37.0  Visceral Fat 10 8 8 8 9   Fat-Free Mass % 61.5 64.6 65.7 64.6 62.9   Total Body Water % 45.2 46.8 47.3 46.8 45.9  Muscle-Mass lbs 35.9 35.7 35.6 35.6 36.0  BMI 32.6 29.6 28.7 29.5 31.1  Body Fat Displacement              Torso  lbs 52.9 44.2 41.5 44.0 49.3         Left Leg  lbs 10.5 8.8 8.3 8.8 9.8         Right Leg  lbs 10.5 8.8 8.3 8.8 9.8         Left Arm  lbs 5.2 4.4 4.1 4.4 4.9         Right Arm   lbs 5.2 4.4 4.1 4.4 4.9   Clinical  Medical hx: DM, hyperlipidemia  Medications: Rybelsus, metofrmin atorvastatin, spirolactone, xanax, vitamin D  Labs: A1C 6.4 Notable signs/symptoms: migraines Any previous deficiencies? Yes, vitamin D Supplements:  Bariatric Multivitamin and calcium Fluids: 64+ ounces  Pt states her job has been busy for the past two months, stating things are getting back to normal and she can re-engage at the gym, and her routine. Pt states beef doesn't agree with her. Pt state she has had really bad heart burn, stating she is using Tums for relief. Pt states water/fluid intake has been good, stating when she is traveling she could use a little more water/fluid intake. Pt states she is tolerating food well, except for the heartburn.  Pt states [facility] taught her a lot of exercises she can do at home, stating she purchased some equipment for the home that she used in the [facility] program. Pt states she sometimes gets full from her complex carbohydrate choice, stating then she is not hungry for much else.   Dietitian recommended that she eat her protein first, non-starchy vegetables second and complex carbohydrate third.  24-Hr Dietary Recall: First Meal: 2 eggs + 2 slices of bacon, fresh pineapple Snack:  berries Second Meal: usually a sandwich ([place]) sara lee delightful bread or wrap Snack:   Third Meal: salad or smoked [place] or chicken Snack:  Beverages:  plain water, zero sugar lemonade  Information Reviewed/ Discussed During Appointment:  Why you need complex carbohydrates: Whole grains and other complex carbohydrates are required to have a healthy diet. Whole grains provide fiber which can help with blood glucose levels and help keep you satiated. Fruits and starchy vegetables provide essential vitamins and minerals required for immune function, eyesight support, brain support, bone density, wound healing and many other functions within the body. According to the current evidenced based 2020-2025 Dietary Guidelines for Americans, complex carbohydrates are part of a healthy eating pattern which is associated with a decreased risk for type 2 diabetes, cancers, and cardiovascular disease.   -bread choices made of whole grains -Fluid requirements (64-100 ounces) -talk to surgeon next month at appointment about reflux and taking extra calcium in the form of Tums -Inclusion of appropriate multivitamin and calcium supplements -Eat protein from plate first,  then non-starchy vegetables, then complex carbohydrate.  Fluid intake: adequate   Medications: See List Supplementation: multivitamin and calcium, plus Tums for reflux  Using straws: no Drinking while eating: no Having you been chewing well: yes Chewing/swallowing difficulties: no Changes in vision: no Changes to mood/headaches: no Hair loss/Cahnges to skin/Changes to nails: no Any difficulty focusing or concentrating: no Sweating: no Dizziness/Lightheaded: no Palpitations: no  Carbonated beverages: no N/V/D/C/GAS:  no Abdominal Pain: no Dumping syndrome: no  Recent physical activity:  ADL's, gym and exercises at home.  Progress Towards Goal(s):  In Progress Teaching method utilized: Environmental health practitioner & Auditory  Demonstrated degree of understanding via: Teach Back  Readiness Level: Action Barriers to learning/adherence to lifestyle change: none identified  Handouts given during visit include: Bariatric MyPlate Body Composition printout   Teaching Method Utilized:  Visual Auditory Hands on  Demonstrated degree of understanding via:  Teach Back   Monitoring/Evaluation:  Dietary intake, exercise, and body weight. Follow up in January.

## 2022-02-15 ENCOUNTER — Ambulatory Visit: Payer: Managed Care, Other (non HMO) | Admitting: Dietician

## 2022-02-18 ENCOUNTER — Ambulatory Visit: Payer: Managed Care, Other (non HMO) | Admitting: Student

## 2022-02-18 ENCOUNTER — Ambulatory Visit: Payer: Managed Care, Other (non HMO) | Admitting: Plastic Surgery

## 2022-03-10 ENCOUNTER — Ambulatory Visit: Payer: Managed Care, Other (non HMO) | Admitting: Student

## 2022-03-11 ENCOUNTER — Other Ambulatory Visit: Payer: Self-pay | Admitting: Family Medicine

## 2022-03-11 DIAGNOSIS — G43109 Migraine with aura, not intractable, without status migrainosus: Secondary | ICD-10-CM

## 2022-03-22 ENCOUNTER — Encounter: Payer: Self-pay | Admitting: Dietician

## 2022-03-22 ENCOUNTER — Encounter: Payer: Managed Care, Other (non HMO) | Attending: Surgery | Admitting: Dietician

## 2022-03-22 DIAGNOSIS — E669 Obesity, unspecified: Secondary | ICD-10-CM | POA: Diagnosis present

## 2022-03-22 DIAGNOSIS — E119 Type 2 diabetes mellitus without complications: Secondary | ICD-10-CM | POA: Diagnosis present

## 2022-03-22 NOTE — Progress Notes (Signed)
Follow-up visit:  Post-Operative sleeve Surgery  Medical Nutrition Therapy:  Appt start time: 4:38 pm end time:  5:12 pm   Surgery date: 12/08/2020 Surgery type: sleeve gastrectomy Start weight at [facility]: 240.3 Weight today: Pt declined weight today Bowel Habits: Every day to every other day no complaints   Body Composition Scale 12/22/2020 02/02/2021 03/16/2021 06/14/2021 11/15/2021  Current Body Weight 222.3 202.5 196 201.8 214.6  Total Body Fat % 38.4 35.3 34.2 35.3 37.0  Visceral Fat 10 8 8 8 9  $ Fat-Free Mass % 61.5 64.6 65.7 64.6 62.9   Total Body Water % 45.2 46.8 47.3 46.8 45.9  Muscle-Mass lbs 35.9 35.7 35.6 35.6 36.0  BMI 32.6 29.6 28.7 29.5 31.1  Body Fat Displacement              Torso  lbs 52.9 44.2 41.5 44.0 49.3         Left Leg  lbs 10.5 8.8 8.3 8.8 9.8         Right Leg  lbs 10.5 8.8 8.3 8.8 9.8         Left Arm  lbs 5.2 4.4 4.1 4.4 4.9         Right Arm   lbs 5.2 4.4 4.1 4.4 4.9   Clinical  Medical hx: DM, hyperlipidemia  Medications: Rybelsus, metofrmin atorvastatin, spirolactone, xanax, vitamin D  Labs: A1C 6.4 Notable signs/symptoms: migraines Any previous deficiencies? Yes, vitamin D Supplements:  Bariatric Multivitamin and calcium Fluids: 64+ ounces  Pt states she is feeling good, stating she doesn't need to weight. Pt states she only worries about the pain in her shoulder and back. Pt states she has a slipped disk and pain in her shoulder, stating she is working with neurosurgeon for treatment. Pt states the surgeon put her back on heartburn medication.  Pt states she will be getting skin removal surgery.  Pt states she is getting protein at every meal, stating she is getting about 45-50 grams of protein a day. Pt states she takes a fiber gummy when she eats fruit. Pt states she is making her own strawberry water with [place] sugar free syrup. Pt doing great.  24-Hr Dietary Recall: First Meal: 2 eggs + 2 slices of bacon, fresh pineapple or parfait  with greek yogurt Snack:  berries Second Meal: usually a sandwich ([place]) sara lee delightful bread or wrap or Cesar salad with salmon Snack:   Third Meal: salad or smoked [place] or chicken or shrimp or peanut butter sandwich (1/2) with light and fit bread. Snack:  Beverages:  plain water, sugar free strawberry water  Information Reviewed/ Discussed During Appointment:  A balanced diet typically includes a variety of foods from different food groups, providing essential nutrients such as carbohydrates, proteins, fats, vitamins, and minerals.  Tips for maintaining a balanced eating plan:  Focus on protein: Prioritize lean protein sources to support muscle preservation.  Incorporate lean proteins: Include sources of lean protein in your diet, such as poultry, fish, beans, legumes, tofu, and low-fat dairy products.  Choose whole grains: Opt for whole grains such as Teddie Curd rice, quinoa, and whole wheat, as they contain more nutrients and fiber compared to refined grains.  Include healthy fats: Choose sources of healthy fats, such as avocados, nuts, seeds, and olive oil. Limit saturated and trans fats found in processed and fried foods.  Monitor portion sizes: Be mindful of portion sizes to avoid over or under eating. Eating in moderation is important for maintaining your health goals  Stay hydrated: Drink at least 64 ounces of water throughout the day. Water is essential for various bodily functions and can also help control hunger. Stay well-hydrated by sipping water throughout the day.  Plan meals and snacks: Planning meals in advance can help ensure that you have a balanced mix of nutrients throughout the day.  Be mindful when eating: Small, frequent meals: Consume small portions and eat slowly to avoid discomfort. Be present and engaged with your meal. Avoid distractions like television or electronic devices. Focus on the act of eating and the sensory experience of the food. This will  help you with any emotional eating such as eating out of boredom  By practicing mindful eating and listening to your body's cues, you can foster a healthier relationship with food, promote better digestion, and support overall well-being.  -bread choices made of whole grains -Inclusion of appropriate multivitamin and calcium supplements -Eat protein from plate first, then non-starchy vegetables, then complex carbohydrate.  Fluid intake: adequate   Medications: See List Supplementation: multivitamin and calcium, plus Tums for reflux  Using straws: no Drinking while eating: no Having you been chewing well: yes Chewing/swallowing difficulties: no Changes in vision: no Changes to mood/headaches: no Hair loss/Cahnges to skin/Changes to nails: no Any difficulty focusing or concentrating: no Sweating: no Dizziness/Lightheaded: no Palpitations: no  Carbonated beverages: no N/V/D/C/GAS: no Abdominal Pain: no Dumping syndrome: no  Recent physical activity:  ADL's, gym and exercises at home.  Progress Towards Goal(s):  In Progress Teaching method utilized: Environmental health practitioner & Auditory  Demonstrated degree of understanding via: Teach Back  Readiness Level: Action Barriers to learning/adherence to lifestyle change: none identified  Handouts given during visit include:  Teaching Method Utilized:  Visual Auditory Hands on  Demonstrated degree of understanding via:  Teach Back   Monitoring/Evaluation:  Dietary intake, exercise, and body weight. Follow up in 4 months.

## 2022-04-08 ENCOUNTER — Encounter: Payer: Self-pay | Admitting: Student

## 2022-04-08 ENCOUNTER — Ambulatory Visit: Payer: Managed Care, Other (non HMO) | Admitting: Student

## 2022-04-08 ENCOUNTER — Telehealth: Payer: Self-pay | Admitting: Student

## 2022-04-08 VITALS — BP 126/80 | HR 79 | Wt 220.2 lb

## 2022-04-08 DIAGNOSIS — M793 Panniculitis, unspecified: Secondary | ICD-10-CM

## 2022-04-08 DIAGNOSIS — Z9884 Bariatric surgery status: Secondary | ICD-10-CM | POA: Diagnosis not present

## 2022-04-08 NOTE — Progress Notes (Signed)
Referring Provider Shelda Pal, DO [address] [place],  [place] [address]   CC:  Chief Complaint  Patient presents with   Follow-up      Maria Johnson is an 42 y.o. female.  HPI: Patient is a 42 y.o. year old female here for follow up after   She was seen for initial consult by Dr. Marla Roe.  At that time, patient reported she had bariatric surgery with sleeve in November 2022.  Patient reported that she had lost 40 pounds since then.  She stated her goal is to lose 20 or 30 more pounds.  Patient stated that she was diabetic prior to surgery and has since been cleared.  Her A1c in May 2023 was 6.4.  Patient also reported she was experiencing rashes and skin breakdown in her folds.  Patient was found to be a good candidate for panniculectomy, however it was recommended to the patient to achieve her goal weight prior to surgery to have the best result.  Plan was to see patient back for reevaluation.  Today, patient reports she is doing well.  She states that she has been working on diet and exercise.  She states that she has been working with a Physiological scientist and a nutritionist.  She reports that she has been told she has been gaining muscle mass and that different areas of her body such as her legs have been decreasing in circumference based off of measurements taken by her personal trainer and nutritionist.  Patient also reports that she feels her clothes are fitting a little bit better.  Patient's weight today is 220 pounds.  She states that she is overall happy with where she is at physically.  Patient does still express the desire to pursue surgical intervention for panniculectomy at this time.                     Patient states that she has not gotten her A1c checked in about a year or so.    Patient denies any changes in her health.  Review of Systems General: Denies fevers or chills   Physical Exam    04/08/2022   11:31 AM 11/15/2021    4:44 PM  10/19/2021    9:31 AM  Vitals with BMI  Height  '5\' 9"'$  5' 5.5"  Weight 220 lbs 3 oz 214 lbs 10 oz 212 lbs 3 oz  BMI  99991111 [id number]  Systolic [id number]  [id number]  Diastolic 80  81  Pulse 79  78    General:  No acute distress,  Alert and oriented, Non-Toxic, Normal speech and affect Psych: Normal behavior and mood Respiratory: No increased WOB MSK: Ambulatory  Assessment/Plan  Panniculitis   I discussed with the patient that I will discuss with Dr. Marla Roe about moving forward with surgery.  I discussed with the patient that she should continue to work on her weight reduction.  Patient expressed understanding.  I discussed with the patient that she will most likely need a repeat A1c prior to surgery.  I discussed with the patient that she should follow-up with her PCP on this as she has not been checked in about a year or so.  Patient expressed understanding.  I did discuss case with Dr. Marla Roe.  It would be more beneficial for the patient if she can continue to work on her weight loss as this would help with a better surgical outcome.  Patient should try to  lose another 10 to 20 pounds.  I discussed with this with patient.  Patient expressed understanding.  Patient seem motivated to lose the weight.  Patient to follow back up in 4 months.  I discussed with the patient that if she loses the weight sooner, she can call and let us know.  Patient expressed understanding.  I instructed the patient to call back she has any questions or concerns.   Clance Boll 04/08/2022, 12:52 PM

## 2022-04-08 NOTE — Telephone Encounter (Signed)
Called pt to schedule four month weight f/u appt and she will call back for a July appt when new schedules are ready.

## 2022-04-11 ENCOUNTER — Encounter: Payer: Self-pay | Admitting: Family Medicine

## 2022-04-11 ENCOUNTER — Other Ambulatory Visit (HOSPITAL_BASED_OUTPATIENT_CLINIC_OR_DEPARTMENT_OTHER): Payer: Self-pay | Admitting: Obstetrics and Gynecology

## 2022-04-11 DIAGNOSIS — Z1231 Encounter for screening mammogram for malignant neoplasm of breast: Secondary | ICD-10-CM

## 2022-04-12 ENCOUNTER — Telehealth (INDEPENDENT_AMBULATORY_CARE_PROVIDER_SITE_OTHER): Payer: Managed Care, Other (non HMO) | Admitting: Family Medicine

## 2022-04-12 ENCOUNTER — Encounter: Payer: Self-pay | Admitting: General Practice

## 2022-04-12 ENCOUNTER — Encounter: Payer: Self-pay | Admitting: Family Medicine

## 2022-04-12 DIAGNOSIS — L989 Disorder of the skin and subcutaneous tissue, unspecified: Secondary | ICD-10-CM

## 2022-04-12 MED ORDER — TRAMADOL HCL 50 MG PO TABS: 50.0000 mg | ORAL_TABLET | Freq: Three times a day (TID) | ORAL | 0 refills | Status: AC | PRN

## 2022-04-12 MED ORDER — DOXYCYCLINE HYCLATE 100 MG PO TABS: 100.0000 mg | ORAL_TABLET | Freq: Two times a day (BID) | ORAL | 0 refills | Status: AC

## 2022-04-12 NOTE — Progress Notes (Signed)
Chief Complaint  Patient presents with   Insect Bite    Maria Johnson is a 42 y.o. female here for a skin complaint.  Duration: 2 days Location: R shoulder Pruritic? No Painful? Yes Drainage? Yes- clear Bit by a spider? Other associated symptoms: has not let her sleep; no fevers Therapies tried thus far: TAO, Advil, Tylenol  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   Pes planus 10/22/2015   Pneumonia    Sickle cell trait (HCC)    Sleep apnea    has cpap   SOBOE (shortness of breath on exertion)    Sprain of medial collateral ligament of left knee 10/30/2018   Type 2 diabetes mellitus with hyperglycemia ([place]) 08/12/2013   Vitamin D deficiency 08/24/2017   Well adult exam 09/25/2017   Objective No conversational dyspnea Age appropriate judgment and insight Nml affect and mood R shoulder with central excoriation with surrounding erythema on screen  Skin lesion - Plan: doxycycline (VIBRA-TABS) 100 MG tablet, traMADol (ULTRAM) 50 MG tablet  7 d of doxy to tx for skin infection. Tramadol prn. Warnings about drowsiness verbalized, she will take at night. Ice, Tylenol, NSAIDs.  F/u prn. The patient voiced understanding and agreement to the plan.  [place], DO 04/12/22 2:17 PM

## 2022-05-01 ENCOUNTER — Ambulatory Visit
Admission: RE | Admit: 2022-05-01 | Discharge: 2022-05-01 | Disposition: A | Discharge status: Home / Self Care | Payer: Managed Care, Other (non HMO) | Source: Ambulatory Visit

## 2022-05-01 ENCOUNTER — Ambulatory Visit: Payer: Managed Care, Other (non HMO)

## 2022-05-01 VITALS — BP 140/85 | HR 85 | Temp 98.2°F | Resp 16

## 2022-05-01 DIAGNOSIS — S60051A Contusion of right little finger without damage to nail, initial encounter: Secondary | ICD-10-CM | POA: Diagnosis not present

## 2022-05-01 DIAGNOSIS — M79644 Pain in right finger(s): Secondary | ICD-10-CM

## 2022-05-01 DIAGNOSIS — S6000XA Contusion of unspecified finger without damage to nail, initial encounter: Secondary | ICD-10-CM

## 2022-05-01 MED ORDER — FAMOTIDINE 20 MG PO TABS: 20.0000 mg | ORAL_TABLET | Freq: Two times a day (BID) | ORAL | 0 refills | Status: DC

## 2022-05-01 MED ORDER — NAPROXEN 500 MG PO TABS: 500.0000 mg | ORAL_TABLET | Freq: Two times a day (BID) | ORAL | 0 refills | Status: DC

## 2022-05-01 MED ORDER — OMEPRAZOLE 20 MG PO CPDR: 20.0000 mg | DELAYED_RELEASE_CAPSULE | Freq: Every day | ORAL | 0 refills | Status: DC

## 2022-05-01 NOTE — Discharge Instructions (Signed)
Wear the splint during the day.  He can use it over this next week depending on your pain level.  Use naproxen for the pain and inflammation.

## 2022-05-01 NOTE — ED Triage Notes (Signed)
Pt reports pain, swelling in right pinky finger x 3 days, pain radiates to right elbow.Reports she slammed the finger with a door. Pt reports she had some green drainage in the right pinky nail when she removed the acrylic.   Tylenol with ibuprofen and warm water gives no relief.

## 2022-05-01 NOTE — ED Provider Notes (Addendum)
[facility]  Note:  This document was prepared using Systems analyst and may include unintentional dictation errors.  MRN: [id number] DOB: 07/02/1980  Subjective:   Maria Johnson is a 42 y.o. female presenting for 3 day history of acute onset persistent moderate to severe right little finger pain.  Symptoms started from having her pinky slammed into a door.  She had an acrylic nail that ended up getting damaged as well.  She noticed some clear fluid that came out.  She has since removed the nail and no more drainage or bleeding has happened.  Has used Tylenol and ibuprofen with minimal relief.  No current facility-administered medications for this encounter.  Current Outpatient Medications:    acetaminophen (TYLENOL) 500 MG tablet, Take 500 mg by mouth every 6 (six) hours as needed., Disp: , Rfl:    ibuprofen (ADVIL) 200 MG tablet, Take 200 mg by mouth every 6 (six) hours as needed., Disp: , Rfl:    ALPRAZolam (XANAX) 0.5 MG tablet, Take 1 tablet (0.5 mg total) by mouth daily as needed for anxiety., Disp: 30 tablet, Rfl: 2   atorvastatin (LIPITOR) 40 MG tablet, Take 1 tablet (40 mg total) by mouth daily., Disp: 90 tablet, Rfl: 3   Multiple Vitamins-Minerals (MULTIVITAMIN WITH MINERALS) tablet, Take 1 tablet by mouth daily., Disp: , Rfl:    ondansetron (ZOFRAN-ODT) 4 MG disintegrating tablet, DISSOLVE 1 TABLET(4 MG) ON THE TONGUE EVERY 8 HOURS AS NEEDED FOR NAUSEA OR VOMITING., Disp: 20 tablet, Rfl: 0   SUMAtriptan (IMITREX) 100 MG tablet, Take 1 tab daily as needed for migraines. May repeat in 2 hours if headache persists or recurs., Disp: 10 tablet, Rfl: 2   Allergies  Allergen Reactions   Molds & Smuts Other (See Comments)    Sneezing, runny eyes and nose    Past Medical History:  Diagnosis Date   Acne    takes doxocycline daily for acne   Anemia    Anginal pain (HCC)    Anxiety    Atopic dermatitis 04/04/2018   Back pain     Bilateral carpal tunnel syndrome 04/04/2018   Bronchitis    history of   Carpal tunnel syndrome on both sides    Chicken pox    Depression    Depression    Diabetes mellitus    Generalized anxiety disorder 01/08/2014   Heart murmur    Hirsutism 08/07/2019   Hyperlipidemia    Insomnia 08/24/2017   Mixed hyperlipidemia 08/23/2019   OSA on CPAP 09/25/2017   Osteochondritis dissecans of ankle, 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   Pes planus 10/22/2015   Pneumonia    Sickle cell trait (HCC)    Sleep apnea    has cpap   SOBOE (shortness of breath on exertion)    Sprain of medial collateral ligament of left knee 10/30/2018   Type 2 diabetes mellitus with hyperglycemia ([place]) 08/12/2013   Vitamin D deficiency 08/24/2017   Well adult exam 09/25/2017     Past Surgical History:  Procedure Laterality Date   ANKLE SURGERY  05/2003   torn cartilage  repair; left   ANTERIOR LUMBAR FUSION  01/06/2011   Procedure: ANTERIOR LUMBAR FUSION 1 LEVEL;  Surgeon: Peggyann Shoals, MD;  Location: [facility];  Service: Neurosurgery;  Laterality: N/A;  Lumbar five-Sacral one  Anterior Lumbar Interbody Fusion with Instrumentation    BACK SURGERY     Laminectomy Sept 14, 2011,   DIAGNOSTIC LAPAROSCOPY  01/2002  removed scar tissue   LAMINECTOMY AND MICRODISCECTOMY LUMBAR SPINE  10/2009   L4, L5, S1   LAPAROSCOPIC GASTRIC SLEEVE RESECTION N/A 12/08/2020   Procedure: LAPAROSCOPIC GASTRIC SLEEVE RESECTION;  Surgeon: Clovis Riley, MD;  Location: [facility];  Service: General;  Laterality: N/A;   LUMBAR FUSION  01/06/2011   L5-S1   TOE SURGERY  07/05/2020   TONSILLECTOMY  03/2005   UPPER GI ENDOSCOPY N/A 12/08/2020   Procedure: UPPER GI ENDOSCOPY;  Surgeon: Clovis Riley, MD;  Location: [facility];  Service: General;  Laterality: N/A;   VAGINAL HYSTERECTOMY  08/2007   partial; AUB    Family History  Problem Relation Age of Onset   22 / Stillbirths Mother    Hypertension Mother    Alcohol abuse Father     Depression Father    Drug abuse Father    Hyperlipidemia Father    Stroke Father    Anxiety disorder Father    Sleep apnea Father    Alcoholism Father    Depression Daughter    Diabetes Daughter    Hypertension Daughter    Miscarriages / [place] Daughter    Birth defects Son    Diabetes Maternal Grandmother    Heart attack Maternal Grandmother    Hypertension Maternal Grandmother    Stroke Maternal Grandmother    Diabetes Maternal Grandfather    Hypertension Maternal Grandfather    Diabetes Paternal Grandmother    Lupus Paternal Grandmother     Social History   Tobacco Use   Smoking status: Never   Smokeless tobacco: Never  Vaping Use   Vaping Use: Never used  Substance Use Topics   Alcohol use: Not Currently    Comment: occassional   Drug use: No    ROS   Objective:   Vitals: BP (!) 140/85 (BP Location: Left Arm)   Pulse 85   Temp 98.2 F (36.8 C) (Oral)   Resp 16   LMP 08/09/2007   SpO2 98%   Physical Exam Constitutional:      General: She is not in acute distress.    Appearance: Normal appearance. She is well-developed. She is not ill-appearing, toxic-appearing or diaphoretic.  HENT:     Head: Normocephalic and atraumatic.     Nose: Nose normal.     Mouth/Throat:     Mouth: Mucous membranes are moist.  Eyes:     General: No scleral icterus.       Right eye: No discharge.        Left eye: No discharge.     Extraocular Movements: Extraocular movements intact.  Cardiovascular:     Rate and Rhythm: Normal rate.  Pulmonary:     Effort: Pulmonary effort is normal.  Musculoskeletal:       Hands:  Skin:    General: Skin is warm and dry.  Neurological:     General: No focal deficit present.     Mental Status: She is alert and oriented to person, place, and time.  Psychiatric:        Mood and Affect: Mood normal.        Behavior: Behavior normal.     DG Finger Little Right  Result Date: 05/01/2022 CLINICAL DATA:  right finger pain EXAM:  RIGHT LITTLE FINGER 2+V COMPARISON:  None Available. FINDINGS: There is no evidence of fracture or dislocation. There is no evidence of arthropathy or other focal bone abnormality. Soft tissues are unremarkable. No radiodense foreign body or subcutaneous gas. IMPRESSION: Negative. Electronically  Signed   By: Corlis Leak M.D.   On: 05/01/2022 09:01    Patient placed into a static finger splint at the level of the DIP for the right fifth finger.  Assessment and Plan :   PDMP not reviewed this encounter.  1. Contusion of right little finger without damage to nail, initial encounter   2. Finger pain, right     Patient splinted as above. Recommended conservative management for right fifth finger contusion.  Use RICE method, naproxen for pain and inflammation.  Use famotidine and omeprazole given the history of the gastric sleeve.  Counseled patient on potential for adverse effects with medications prescribed/recommended today, ER and return-to-clinic precautions discussed, patient verbalized understanding.     Wallis Bamberg, PA-C 05/01/22 [id number]    Wallis Bamberg, PA-C 05/10/22 [id number]

## 2022-05-16 ENCOUNTER — Encounter: Payer: Self-pay | Admitting: *Deleted

## 2022-05-23 ENCOUNTER — Ambulatory Visit (HOSPITAL_BASED_OUTPATIENT_CLINIC_OR_DEPARTMENT_OTHER)
Admission: RE | Admit: 2022-05-23 | Discharge: 2022-05-23 | Disposition: A | Discharge status: Home / Self Care | Payer: Managed Care, Other (non HMO) | Source: Ambulatory Visit | Attending: Obstetrics and Gynecology | Admitting: Obstetrics and Gynecology

## 2022-05-23 ENCOUNTER — Encounter (HOSPITAL_BASED_OUTPATIENT_CLINIC_OR_DEPARTMENT_OTHER): Payer: Self-pay

## 2022-05-23 DIAGNOSIS — Z1231 Encounter for screening mammogram for malignant neoplasm of breast: Secondary | ICD-10-CM | POA: Diagnosis present

## 2022-05-26 ENCOUNTER — Other Ambulatory Visit: Payer: Self-pay | Admitting: Family Medicine

## 2022-05-26 ENCOUNTER — Encounter: Payer: Self-pay | Admitting: Family Medicine

## 2022-05-26 DIAGNOSIS — G43109 Migraine with aura, not intractable, without status migrainosus: Secondary | ICD-10-CM

## 2022-05-26 MED ORDER — ALPRAZOLAM 0.5 MG PO TABS: 0.5000 mg | ORAL_TABLET | Freq: Every day | ORAL | 5 refills | Status: DC | PRN

## 2022-05-26 NOTE — Telephone Encounter (Signed)
Requesting: alprazolam 0.5mg   Contract: None UDS: None Last Visit: 04/12/22 Next Visit: 07/05/22 Last Refill: 11/05/21 #30 and 2RF   Please Advise

## 2022-06-06 IMAGING — MG MM DIGITAL SCREENING BILAT W/ TOMO AND CAD
8 series · 8 of 24 positions shown · non-contrast
Comparison: Previous exam(s).

CLINICAL DATA: Screening.

EXAM:
DIGITAL SCREENING BILATERAL MAMMOGRAM WITH TOMOSYNTHESIS AND CAD
TECHNIQUE: Bilateral screening digital craniocaudal and mediolateral oblique
mammograms were obtained. Bilateral screening digital breast
tomosynthesis was performed. The images were evaluated with
computer-aided detection.

[L CC synth-2D]
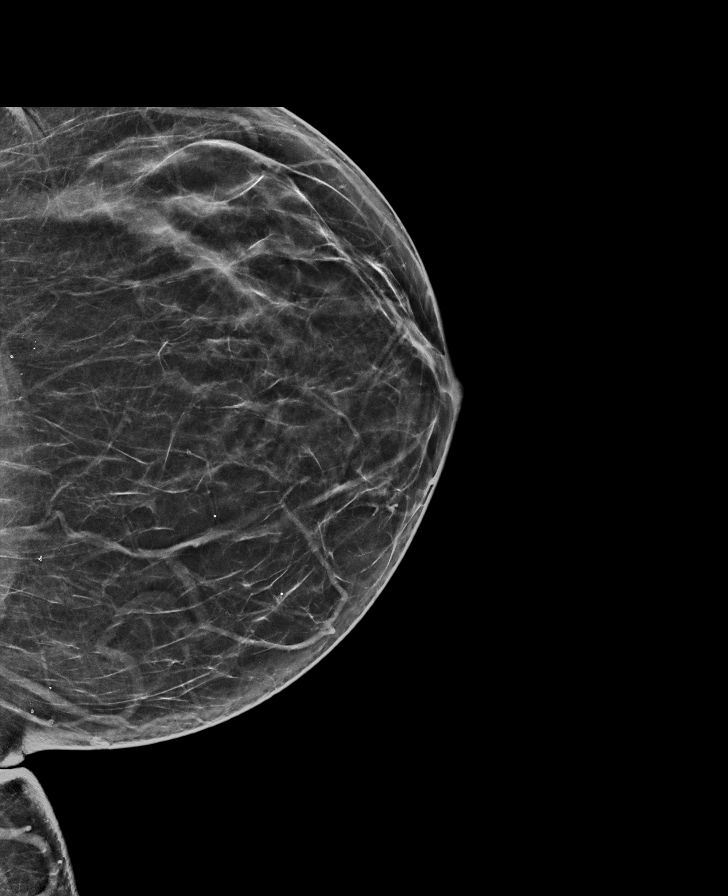

[R CC synth-2D]
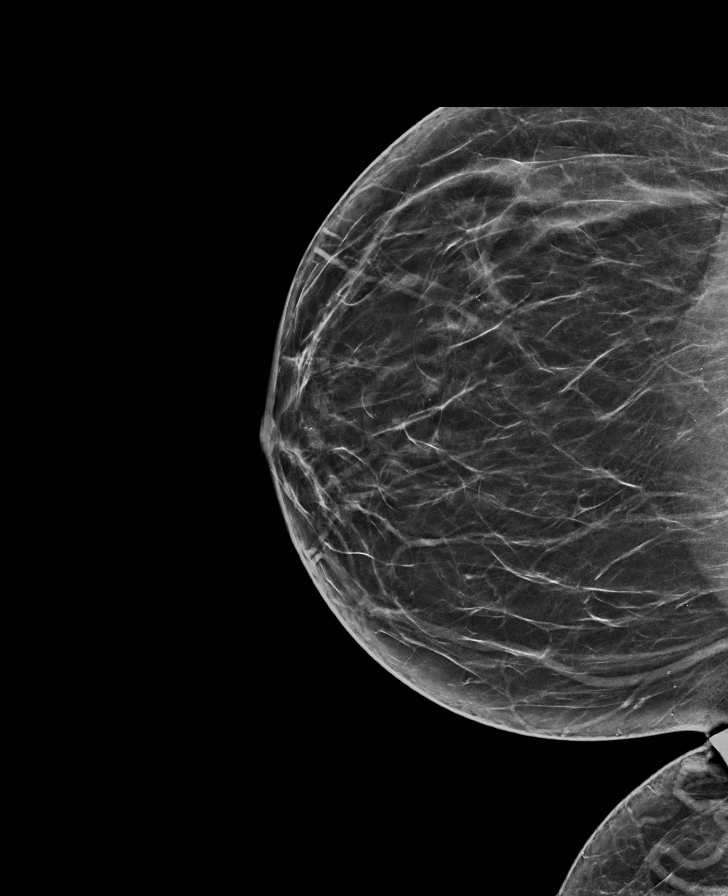

[R MLO synth-2D]
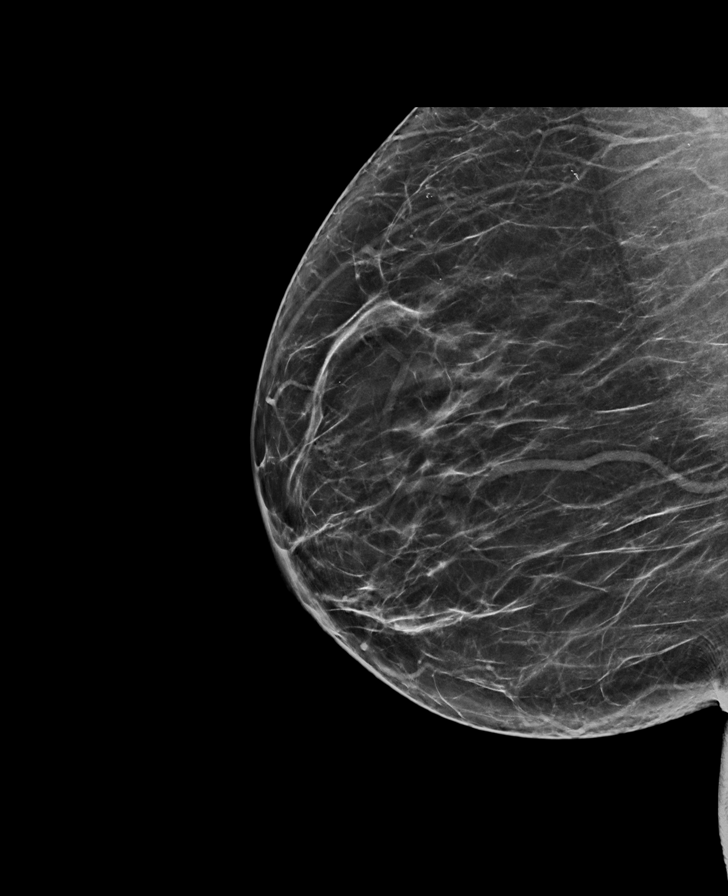

[L MLO synth-2D]
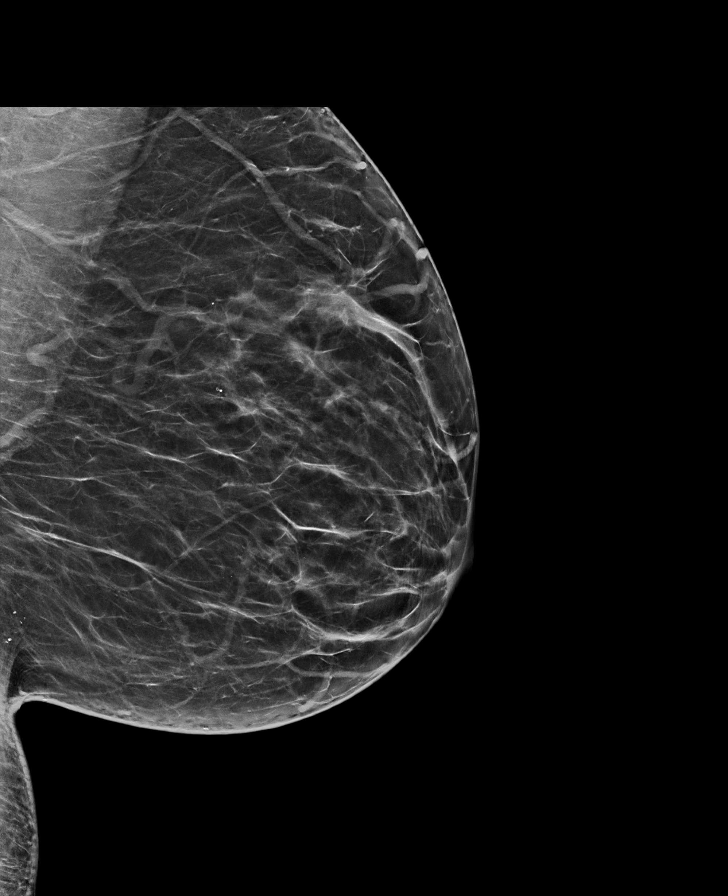

[L MLO tomo · tomo slice 34/67.0]
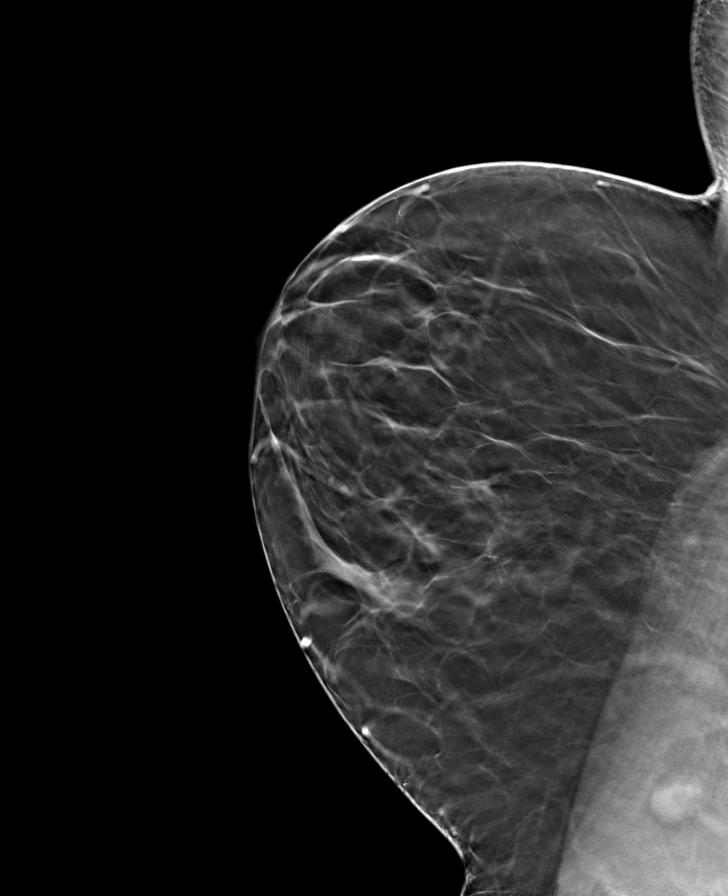

[R MLO tomo · tomo slice 35/68.0]
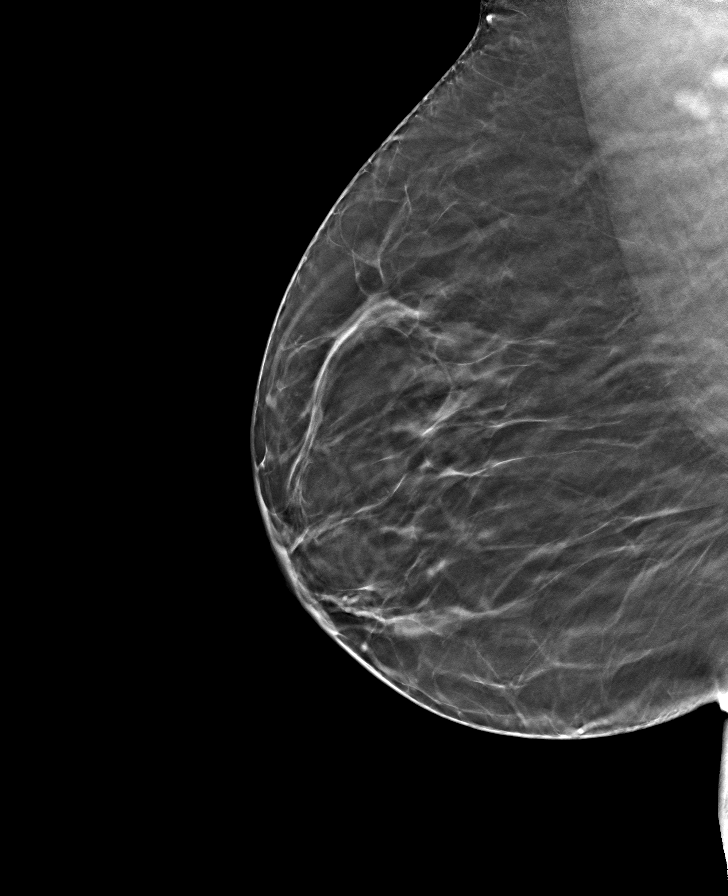

[L CC tomo · tomo slice 33/65.0]
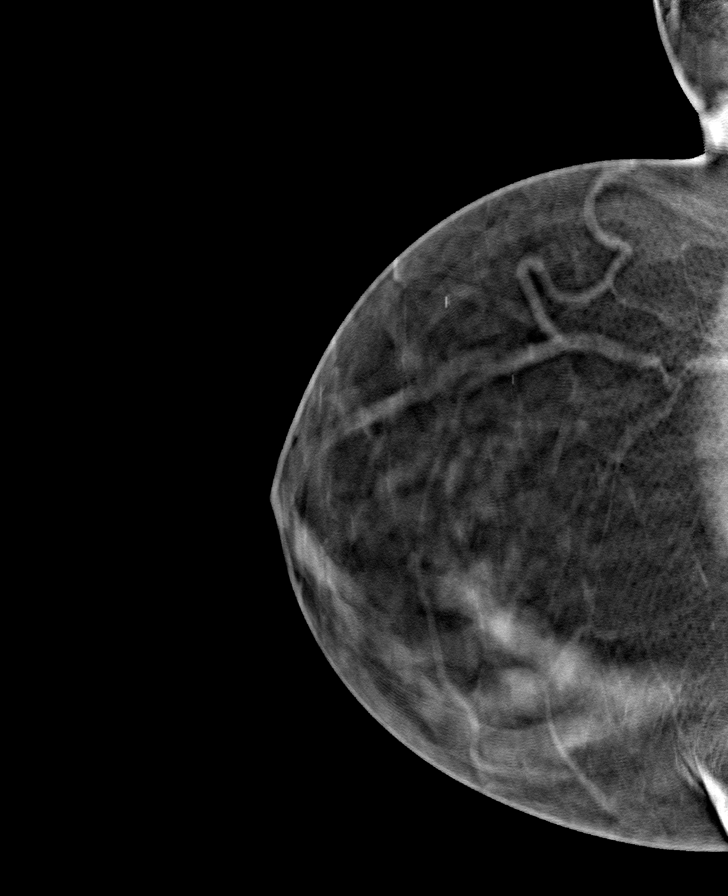

[R CC tomo · tomo slice 33/64.0]
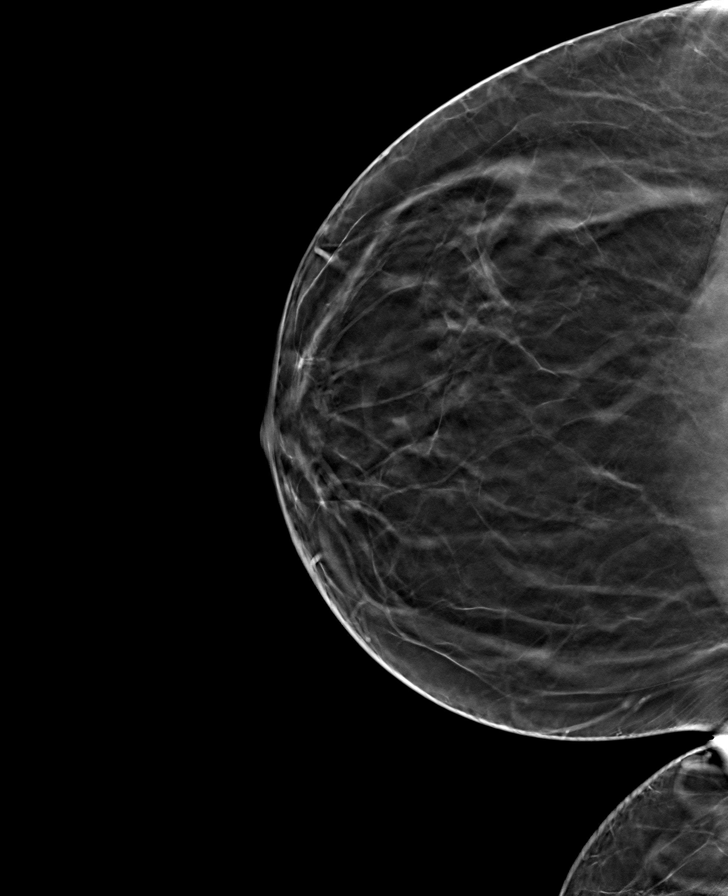

[8 of 24 positions shown; findings below may reference images not displayed]

ACR Breast Density Category b: There are scattered areas of
fibroglandular density.
FINDINGS: There are no findings suspicious for malignancy.
IMPRESSION: No mammographic evidence of malignancy. A result letter of this
screening mammogram will be mailed directly to the patient.

RECOMMENDATION:
Screening mammogram in one year. (Code:51-O-LD2)

BI-RADS CATEGORY  1: Negative.

## 2022-06-19 ENCOUNTER — Other Ambulatory Visit: Payer: Self-pay | Admitting: Family Medicine

## 2022-06-19 DIAGNOSIS — E1169 Type 2 diabetes mellitus with other specified complication: Secondary | ICD-10-CM

## 2022-06-21 ENCOUNTER — Encounter: Payer: Self-pay | Admitting: Family Medicine

## 2022-06-21 ENCOUNTER — Other Ambulatory Visit: Payer: Self-pay

## 2022-06-21 ENCOUNTER — Telehealth (INDEPENDENT_AMBULATORY_CARE_PROVIDER_SITE_OTHER): Payer: Managed Care, Other (non HMO) | Admitting: Family Medicine

## 2022-06-21 DIAGNOSIS — T7840XA Allergy, unspecified, initial encounter: Secondary | ICD-10-CM | POA: Diagnosis not present

## 2022-06-21 MED ORDER — EPINEPHRINE 0.3 MG/0.3ML IJ SOAJ: 0.3000 mg | INTRAMUSCULAR | 0 refills | Status: DC | PRN

## 2022-06-21 MED ORDER — FAMOTIDINE 40 MG PO TABS: 40.0000 mg | ORAL_TABLET | Freq: Every day | ORAL | 0 refills | Status: DC

## 2022-06-21 MED ORDER — PREDNISONE 20 MG PO TABS: 40.0000 mg | ORAL_TABLET | Freq: Every day | ORAL | 0 refills | Status: AC

## 2022-06-21 MED ORDER — AUVI-Q 0.3 MG/0.3ML IJ SOAJ: 0.3000 mg | INTRAMUSCULAR | 0 refills | Status: DC | PRN

## 2022-06-21 NOTE — Progress Notes (Signed)
Chief Complaint  Patient presents with   Allergic Reaction    Shajuan SHARLETTA BOLAN is a 42 y.o. female here for an allergic reaction. We are interacting via web portal for an electronic face-to-face visit. I verified patient's ID using 2 identifiers. Patient agreed to proceed with visit via this method. Patient is at home, I am at office. Patient and I are present for visit.   Duration: 1  d Any new medications, lotions, soaps, topicals or detergents? No ACEi/ARB/Estrogen? No Hx of allergic rxn/angioedema/anaphylaxis? No Did recently have neck surgery on Flexeril, Tylenol, hydrocodone.  +swelling of lips.  Starting to get itchy bumps on her hands.  She specifically denies shortness of breath, swelling inside mouth, or swelling in the throat.   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   Pes planus 10/22/2015   Pneumonia    Sickle cell trait (HCC)    Sleep apnea    has cpap   SOBOE (shortness of breath on exertion)    Sprain of medial collateral ligament of left knee 10/30/2018   Type 2 diabetes mellitus with hyperglycemia (HCC) 08/12/2013   Vitamin D deficiency 08/24/2017   Well adult exam 09/25/2017    Family History  Problem Relation Age of Onset   Miscarriages / Stillbirths Mother    Hypertension Mother    Alcohol abuse Father    Depression Father    Drug abuse Father    Hyperlipidemia Father    Stroke Father    Anxiety disorder  Father    Sleep apnea Father    Alcoholism Father    Depression Daughter    Diabetes Daughter    Hypertension Daughter    Miscarriages / [place] Daughter    Birth defects Son    Diabetes Maternal Grandmother    Heart attack Maternal Grandmother    Hypertension Maternal Grandmother    Stroke Maternal Grandmother    Diabetes Maternal Grandfather    Hypertension Maternal Grandfather    Diabetes Paternal Grandmother    Lupus Paternal Grandmother    Objective No conversational dyspnea +edema of lips noted more on the L than the R Age appropriate judgment and insight Nml affect and mood  Allergic reaction, initial encounter - Plan: predniSONE (DELTASONE) 20 MG tablet, famotidine (PEPCID) 40 MG tablet, EPINEPHrine (EPIPEN 2-PAK) 0.3 mg/0.3 mL IJ SOAJ injection  Pepcid, Zyrtec (has at home), Benadryl prn (has at home, 7 d pred burst 40 mg/d, EpiPen prn. No obvious trigger. Pt informed to seek emergent care if starting to experience SOB, swelling with tongue or airway/neck.  F/u prn. The patient voiced understanding and agreement to the plan.  Jilda Roche [place], DO 06/21/22 2:55 PM

## 2022-06-22 ENCOUNTER — Encounter: Payer: Self-pay | Admitting: Family Medicine

## 2022-06-22 ENCOUNTER — Other Ambulatory Visit: Payer: Self-pay | Admitting: Family Medicine

## 2022-06-22 ENCOUNTER — Other Ambulatory Visit (HOSPITAL_COMMUNITY): Payer: Self-pay

## 2022-06-22 ENCOUNTER — Other Ambulatory Visit (HOSPITAL_BASED_OUTPATIENT_CLINIC_OR_DEPARTMENT_OTHER): Payer: Self-pay

## 2022-06-22 DIAGNOSIS — T783XXD Angioneurotic edema, subsequent encounter: Secondary | ICD-10-CM

## 2022-06-22 DIAGNOSIS — E1169 Type 2 diabetes mellitus with other specified complication: Secondary | ICD-10-CM

## 2022-06-22 MED ORDER — EPINEPHRINE 0.3 MG/0.3ML IJ SOAJ
0.3000 mg | INTRAMUSCULAR | 3 refills | Status: DC | PRN
  Filled 2022-06-22: qty 2, 2d supply, fill #0
  Filled 2022-06-25: qty 2, 30d supply, fill #0
  Filled 2023-01-04: qty 2, 30d supply, fill #1
  Filled 2023-04-12: qty 2, 30d supply, fill #2
  Filled 2023-06-13: qty 2, 30d supply, fill #3

## 2022-06-22 MED ORDER — HYDROXYZINE PAMOATE 25 MG PO CAPS: 25.0000 mg | ORAL_CAPSULE | Freq: Three times a day (TID) | ORAL | 0 refills | Status: DC | PRN

## 2022-06-22 NOTE — Telephone Encounter (Signed)
See other mychart messages

## 2022-06-23 ENCOUNTER — Other Ambulatory Visit (HOSPITAL_BASED_OUTPATIENT_CLINIC_OR_DEPARTMENT_OTHER): Payer: Self-pay

## 2022-06-25 ENCOUNTER — Other Ambulatory Visit (HOSPITAL_COMMUNITY): Payer: Self-pay

## 2022-06-27 ENCOUNTER — Telehealth: Payer: Self-pay

## 2022-06-27 ENCOUNTER — Encounter: Payer: Self-pay | Admitting: Family Medicine

## 2022-06-27 NOTE — Transitions of Care (Post Inpatient/ED Visit) (Unsigned)
   06/27/2022  Name: Maria Johnson MRN: [id number] DOB: [birth year]  Today's TOC FU Call Status: Today's TOC FU Call Status:: Unsuccessul Call (1st Attempt) Unsuccessful Call (1st Attempt) Date: 06/27/22  Attempted to reach the patient regarding the most recent Inpatient/ED visit.  Follow Up Plan: Additional outreach attempts will be made to reach the patient to complete the Transitions of Care (Post Inpatient/ED visit) call.   Signature  Karena Addison, LPN [facility] Nurse Health Advisor Direct Dial [phone]

## 2022-06-28 NOTE — Transitions of Care (Post Inpatient/ED Visit) (Unsigned)
   06/28/2022  Name: Maria Johnson MRN: [id number] DOB: 1980/07/13  Today's TOC FU Call Status: Today's TOC FU Call Status:: Unsuccessful Call (2nd Attempt) Unsuccessful Call (1st Attempt) Date: 06/27/22 Unsuccessful Call (2nd Attempt) Date: 06/28/22  Attempted to reach the patient regarding the most recent Inpatient/ED visit.  Follow Up Plan: Additional outreach attempts will be made to reach the patient to complete the Transitions of Care (Post Inpatient/ED visit) call.   Signature Karena Addison, LPN [facility] Nurse Health Advisor Direct Dial [phone]

## 2022-06-28 NOTE — Telephone Encounter (Signed)
Pt called office to see if she can wait to see Dr. Carmelia Roller on 07/05/22 for her hospital follow up. Please advise.

## 2022-06-29 NOTE — Transitions of Care (Post Inpatient/ED Visit) (Signed)
   06/29/2022  Name: Maria Johnson MRN: [id number] DOB: [birth year]  Today's TOC FU Call Status: Today's TOC FU Call Status:: Unsuccessful Call (3rd Attempt) Unsuccessful Call (1st Attempt) Date: 06/27/22 Unsuccessful Call (2nd Attempt) Date: 06/28/22 Unsuccessful Call (3rd Attempt) Date: 06/29/22  Attempted to reach the patient regarding the most recent Inpatient/ED visit.  Follow Up Plan: No further outreach attempts will be made at this time. We have been unable to contact the patient.  Signature Karena Addison, LPN [facility] Nurse Health Advisor Direct Dial [phone]

## 2022-07-04 ENCOUNTER — Ambulatory Visit: Payer: Managed Care, Other (non HMO) | Admitting: Dietician

## 2022-07-05 ENCOUNTER — Encounter: Payer: Self-pay | Admitting: Family Medicine

## 2022-07-05 ENCOUNTER — Ambulatory Visit (INDEPENDENT_AMBULATORY_CARE_PROVIDER_SITE_OTHER): Payer: Managed Care, Other (non HMO) | Admitting: Family Medicine

## 2022-07-05 ENCOUNTER — Other Ambulatory Visit (HOSPITAL_COMMUNITY)
Admission: RE | Admit: 2022-07-05 | Discharge: 2022-07-05 | Disposition: A | Discharge status: Home / Self Care | Payer: Managed Care, Other (non HMO) | Source: Ambulatory Visit | Attending: Medical | Admitting: Medical

## 2022-07-05 ENCOUNTER — Ambulatory Visit: Payer: Managed Care, Other (non HMO) | Admitting: Medical

## 2022-07-05 ENCOUNTER — Encounter: Payer: Self-pay | Admitting: Medical

## 2022-07-05 VITALS — BP 124/79 | HR 86 | Ht 69.0 in | Wt 212.0 lb

## 2022-07-05 VITALS — BP 118/78 | HR 95 | Temp 98.9°F | Ht 69.0 in | Wt 213.1 lb

## 2022-07-05 DIAGNOSIS — Z1339 Encounter for screening examination for other mental health and behavioral disorders: Secondary | ICD-10-CM

## 2022-07-05 DIAGNOSIS — Z113 Encounter for screening for infections with a predominantly sexual mode of transmission: Secondary | ICD-10-CM | POA: Insufficient documentation

## 2022-07-05 DIAGNOSIS — Z79899 Other long term (current) drug therapy: Secondary | ICD-10-CM | POA: Diagnosis not present

## 2022-07-05 DIAGNOSIS — Z01419 Encounter for gynecological examination (general) (routine) without abnormal findings: Secondary | ICD-10-CM | POA: Diagnosis not present

## 2022-07-05 DIAGNOSIS — Z23 Encounter for immunization: Secondary | ICD-10-CM | POA: Diagnosis not present

## 2022-07-05 DIAGNOSIS — E119 Type 2 diabetes mellitus without complications: Secondary | ICD-10-CM | POA: Diagnosis not present

## 2022-07-05 DIAGNOSIS — Z0000 Encounter for general adult medical examination without abnormal findings: Secondary | ICD-10-CM | POA: Diagnosis not present

## 2022-07-05 LAB — MICROALBUMIN / CREATININE URINE RATIO
Creatinine,U: 88.6 mg/dL
Microalb Creat Ratio: 0.9 mg/g (ref 0.0–30.0)
Microalb, Ur: 0.8 mg/dL (ref 0.0–1.9)

## 2022-07-05 LAB — HEMOGLOBIN A1C: Hgb A1c MFr Bld: 6.7 % — ABNORMAL HIGH (ref 4.6–6.5)

## 2022-07-05 LAB — CBC WITH DIFFERENTIAL/PLATELET
Basophils Absolute: 0 10*3/uL (ref 0.0–0.1)
Basophils Relative: 0.5 % (ref 0.0–3.0)
Eosinophils Absolute: 0.1 10*3/uL (ref 0.0–0.7)
Eosinophils Relative: 1.4 % (ref 0.0–5.0)
HCT: 39.7 % (ref 36.0–46.0)
Hemoglobin: 12.5 g/dL (ref 12.0–15.0)
Lymphocytes Relative: 27 % (ref 12.0–46.0)
Lymphs Abs: 1.8 10*3/uL (ref 0.7–4.0)
MCHC: 31.6 g/dL (ref 30.0–36.0)
MCV: 82.5 fl (ref 78.0–100.0)
Monocytes Absolute: 0.4 10*3/uL (ref 0.1–1.0)
Monocytes Relative: 6 % (ref 3.0–12.0)
Neutro Abs: 4.4 10*3/uL (ref 1.4–7.7)
Neutrophils Relative %: 65.1 % (ref 43.0–77.0)
Platelets: 383 10*3/uL (ref 150.0–400.0)
RBC: 4.81 Mil/uL (ref 3.87–5.11)
RDW: 13.7 % (ref 11.5–15.5)
WBC: 6.8 10*3/uL (ref 4.0–10.5)

## 2022-07-05 LAB — LIPID PANEL
Cholesterol: 187 mg/dL (ref 0–200)
HDL: 46.8 mg/dL (ref >39.00)
LDL Cholesterol: 108 mg/dL — ABNORMAL HIGH (ref 0–99)
NonHDL: 139.9
Total CHOL/HDL Ratio: 4
Triglycerides: 158 mg/dL — ABNORMAL HIGH (ref 0.0–149.0)
VLDL: 31.6 mg/dL (ref 0.0–40.0)

## 2022-07-05 NOTE — Addendum Note (Signed)
Addended by: Scharlene Gloss B on: 07/05/2022 08:54 AM   Modules accepted: Orders

## 2022-07-05 NOTE — Progress Notes (Signed)
Chief Complaint  Patient presents with   Annual Exam     Well Woman Maria Johnson is here for a complete physical.   Her last physical was >1 year ago.  Current diet: in general, diet is OK. Current exercise: walking. Weight is stable and she denies fatigue out of ordinary. Patient's last menstrual period was 08/09/2007.Marland Kitchen  Seatbelt? Yes Advanced directive? No  Health Maintenance Pap/HPV- Yes Mammogram- Yes Tetanus- due Hep C screening- Yes HIV screening- Yes  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   Pes planus 10/22/2015   Pneumonia    Sickle cell trait (HCC)    Sleep apnea    has cpap   SOBOE (shortness of breath on exertion)    Sprain of medial collateral ligament of left knee 10/30/2018   Type 2 diabetes mellitus with hyperglycemia (HCC) 08/12/2013   Vitamin D deficiency 08/24/2017   Well adult exam 09/25/2017     Past Surgical History:  Procedure Laterality Date   ANKLE SURGERY  05/2003   torn cartilage  repair; left   ANTERIOR LUMBAR FUSION  01/06/2011   Procedure: ANTERIOR LUMBAR FUSION 1 LEVEL;  Surgeon: Dorian Heckle, MD;  Location: [facility];  Service: Neurosurgery;  Laterality: N/A;  Lumbar five-Sacral one  Anterior Lumbar Interbody Fusion with Instrumentation    BACK SURGERY     Laminectomy Sept 14, 2011,   DIAGNOSTIC LAPAROSCOPY  01/2002   removed scar tissue   LAMINECTOMY AND MICRODISCECTOMY LUMBAR  SPINE  10/2009   L4, L5, S1   LAPAROSCOPIC GASTRIC SLEEVE RESECTION N/A 12/08/2020   Procedure: LAPAROSCOPIC GASTRIC SLEEVE RESECTION;  Surgeon: Berna Bue, MD;  Location: [facility];  Service: General;  Laterality: N/A;   LUMBAR FUSION  01/06/2011   L5-S1   TOE SURGERY  07/05/2020   TONSILLECTOMY  03/2005   UPPER GI ENDOSCOPY N/A 12/08/2020   Procedure: UPPER GI ENDOSCOPY;  Surgeon: Berna Bue, MD;  Location: [facility];  Service: General;  Laterality: N/A;   VAGINAL HYSTERECTOMY  08/2007   partial; AUB    Medications  Current Outpatient Medications on File Prior to Visit  Medication Sig Dispense Refill   acetaminophen (TYLENOL) 500 MG tablet Take 500 mg by mouth every 6 (six) hours as needed.     ALPRAZolam (XANAX) 0.5 MG tablet Take 1 tablet (0.5 mg total) by mouth daily as needed for anxiety. 30 tablet 5   atorvastatin (LIPITOR) 40 MG tablet TAKE 1 TABLET(40 MG) BY MOUTH DAILY 90 tablet 3   EPINEPHrine 0.3 mg/0.3 mL IJ SOAJ injection Inject 0.3 mg into the muscle as needed for anaphylaxis. 2 each 3   famotidine (PEPCID) 40 MG tablet Take 1 tablet (40 mg total) by mouth daily for 21 days. 21 tablet 0   hydrOXYzine (VISTARIL) 25 MG  capsule Take 1 capsule (25 mg total) by mouth every 8 (eight) hours as needed for itching. 30 capsule 0   ibuprofen (ADVIL) 200 MG tablet Take 200 mg by mouth every 6 (six) hours as needed.     Multiple Vitamins-Minerals (MULTIVITAMIN WITH MINERALS) tablet Take 1 tablet by mouth daily.     naproxen (NAPROSYN) 500 MG tablet Take 1 tablet (500 mg total) by mouth 2 (two) times daily with a meal. 30 tablet 0   omeprazole (PRILOSEC) 20 MG capsule Take 1 capsule (20 mg total) by mouth daily. 30 capsule 0   ondansetron (ZOFRAN-ODT) 4 MG disintegrating tablet DISSOLVE 1 TABLET(4 MG) ON THE TONGUE EVERY 8 HOURS AS NEEDED FOR NAUSEA OR VOMITING 20 tablet 0   SUMAtriptan (IMITREX) 100 MG tablet Take 1 tab daily as needed for migraines. May repeat in 2 hours if  headache persists or recurs. 10 tablet 2    Allergies Allergies  Allergen Reactions   Molds & Smuts Other (See Comments)    Sneezing, runny eyes and nose    Review of Systems: Constitutional:  no unexpected weight changes Eye:  no recent significant change in vision Ear/Nose/Mouth/Throat:  Ears:  no recent change in hearing Nose/Mouth/Throat:  no complaints of nasal congestion, no sore throat Cardiovascular: no chest pain Respiratory:  no shortness of breath Gastrointestinal:  no abdominal pain, no change in bowel habits GU:  Female: negative for dysuria or pelvic pain Musculoskeletal/Extremities:  no pain of the joints Integumentary (Skin/Breast):  no abnormal skin lesions reported Neurologic:  no headaches Endocrine:  denies fatigue Hematologic/Lymphatic:  No areas of easy bleeding  Exam BP 118/78 (BP Location: Left Arm, Patient Position: Sitting, Cuff Size: Large)   Pulse 95   Temp 98.9 F (37.2 C) (Oral)   Ht 5\' 9"  (1.753 m)   Wt 213 lb 2 oz (96.7 kg)   LMP 08/09/2007   SpO2 99%   BMI 31.47 kg/m  General:  well developed, well nourished, in no apparent distress Skin:  no significant moles, warts, or growths Head:  no masses, lesions, or tenderness Eyes:  pupils equal and round, sclera anicteric without injection Ears:  canals without lesions, TMs shiny without retraction, no obvious effusion, no erythema Nose:  nares patent, mucosa normal, and no drainage Throat/Pharynx:  lips and gingiva without lesion; tongue and uvula midline; non-inflamed pharynx; no exudates or postnasal drainage Neck: neck supple without adenopathy, thyromegaly, or masses Lungs:  clear to auscultation, breath sounds equal bilaterally, no respiratory distress Cardio:  regular rate and rhythm, no LE edema Abdomen:  abdomen soft, nontender; bowel sounds normal; no masses or organomegaly Genital: Defer to GYN Musculoskeletal:  symmetrical muscle groups noted without atrophy or  deformity Extremities:  no clubbing, cyanosis, or edema, no deformities, no skin discoloration Neuro:  sensation intact to pinprick b/l feet; gait normal; deep tendon reflexes normal and symmetric Psych: well oriented with normal range of affect and appropriate judgment/insight  Assessment and Plan  Well adult exam - Plan: Lipid panel, CBC w/Diff  Encounter for long-term (current) use of high-risk medication - Plan: Drug Monitoring Panel [phone] , Urine  Type 2 diabetes mellitus without complication, without long-term current use of insulin (HCC) - Plan: Hemoglobin A1c, Microalbumin / creatinine urine ratio   Well 42 y.o. female. Counseled on diet and exercise. Has eye exam this Friday.  Tdap today.  Other orders as above. Follow up in 6 mo for DM visit. The patient voiced understanding and agreement to the plan.  Jilda Roche [place], DO 07/05/22 8:35 AM

## 2022-07-05 NOTE — Progress Notes (Signed)
History:  [name] is a 42 y.o. G3P3000 who presents to clinic today for annual exam. Last pap smear was 2015 and normal. Pap smears are no longer indicated due to hysterectomy. Patient is sexually active with one long term female partner. She desires STD testing today. Mammogram from 05/2022 was normal. She denies vaginal bleeding, discharge, UTI symptoms, GI issues or breast concerns today. She has no GYN concerns.   The following portions of the patient's history were reviewed and updated as appropriate: allergies, current medications, family history, past medical history, social history, past surgical history and problem list.  Review of Systems:  Review of Systems  Constitutional:  Negative for fever and malaise/fatigue.  Gastrointestinal:  Negative for abdominal pain, constipation, diarrhea, nausea and vomiting.  Genitourinary:  Negative for dysuria, frequency and urgency.       Neg - vaginal bleeding, discharge, pelvic pain      Objective:  Physical Exam BP 124/79   Pulse 86   Ht 5\' 9"  (1.753 m)   Wt 212 lb (96.2 kg)   LMP 08/09/2007   BMI 31.31 kg/m  Physical Exam Vitals and nursing note reviewed. Exam conducted with a chaperone present.  Constitutional:      General: She is not in acute distress.    Appearance: Normal appearance. She is well-developed and normal weight.  HENT:     Head: Normocephalic and atraumatic.  Cardiovascular:     Rate and Rhythm: Normal rate and regular rhythm.     Heart sounds: No murmur heard. Pulmonary:     Effort: Pulmonary effort is normal. No respiratory distress.     Breath sounds: Normal breath sounds. No wheezing.  Chest:  Breasts:    Right: No swelling, bleeding, inverted nipple, mass, nipple discharge, skin change or tenderness.     Left: No swelling, bleeding, inverted nipple, mass, nipple discharge, skin change or tenderness.  Abdominal:     General: Abdomen is flat. Bowel sounds are normal. There is no distension.      Palpations: Abdomen is soft. There is no mass.     Tenderness: There is no abdominal tenderness. There is no guarding or rebound.  Genitourinary:    General: Normal vulva.     Vagina: No vaginal discharge, erythema, tenderness or bleeding.     Adnexa:        Right: No mass or tenderness.         Left: No mass or tenderness.       Comments: Uterus is surgically absent  Vaginal cuff appears normal Small white discharge noted  Skin:    General: Skin is warm and dry.     Findings: No erythema.  Neurological:     Mental Status: She is alert and oriented to person, place, and time.  Psychiatric:        Mood and Affect: Mood normal.      Health Maintenance Due  Topic Date Due   OPHTHALMOLOGY EXAM  04/24/2021   HEMOGLOBIN A1C  12/30/2021   Diabetic kidney evaluation - Urine ACR  07/07/2022    Labs, imaging and previous visits in Epic and Care Everywhere reviewed  Assessment & Plan:  1. Encounter for annual routine gynecological examination - Pap smear no longer indicated due to hysterectomy for AUB   2. Screening for STDs (sexually transmitted diseases) - Cervicovaginal ancillary only - Hepatitis B surface antigen - Hepatitis C antibody - HIV Antibody (routine testing w rflx) - RPR - Results will be  shared via MyChart   Return in about 1 year (around 07/05/2023) for Annual exam.  Marny Lowenstein, PA-C 07/05/2022 11:30 AM

## 2022-07-05 NOTE — Patient Instructions (Signed)
Give us 2-3 business days to get the results of your labs back.   Keep the diet clean and stay active.  Let us know if you need anything. 

## 2022-07-06 LAB — CERVICOVAGINAL ANCILLARY ONLY
Chlamydia: NEGATIVE
Comment: NEGATIVE
Comment: NEGATIVE
Comment: NORMAL
Neisseria Gonorrhea: NEGATIVE
Trichomonas: NEGATIVE

## 2022-07-07 LAB — DRUG MONITORING PANEL 376104, URINE
Amphetamines: NEGATIVE ng/mL (ref <500)
Barbiturates: NEGATIVE ng/mL (ref <300)
Benzodiazepines: NEGATIVE ng/mL (ref <100)
Cocaine Metabolite: NEGATIVE ng/mL (ref <150)
Desmethyltramadol: NEGATIVE ng/mL (ref <100)
Opiates: NEGATIVE ng/mL (ref <100)
Oxycodone: NEGATIVE ng/mL (ref <100)
Tramadol: NEGATIVE ng/mL (ref <100)

## 2022-07-07 LAB — DM TEMPLATE

## 2022-07-08 LAB — HM DIABETES EYE EXAM

## 2022-07-12 ENCOUNTER — Encounter: Payer: Self-pay | Admitting: Family Medicine

## 2022-07-14 LAB — HIV ANTIBODY (ROUTINE TESTING W REFLEX): HIV Screen 4th Generation wRfx: NONREACTIVE

## 2022-07-14 LAB — RPR: RPR Ser Ql: NONREACTIVE

## 2022-07-14 LAB — HEPATITIS B SURFACE ANTIGEN: Hepatitis B Surface Ag: NEGATIVE

## 2022-07-14 LAB — HEPATITIS C ANTIBODY: Hep C Virus Ab: NONREACTIVE

## 2022-07-15 ENCOUNTER — Encounter: Payer: Self-pay | Admitting: Skilled Nursing Facility1

## 2022-07-15 ENCOUNTER — Encounter: Payer: Managed Care, Other (non HMO) | Attending: Surgery | Admitting: Skilled Nursing Facility1

## 2022-07-15 VITALS — Wt 211.0 lb

## 2022-07-15 DIAGNOSIS — E669 Obesity, unspecified: Secondary | ICD-10-CM | POA: Diagnosis present

## 2022-07-15 DIAGNOSIS — E119 Type 2 diabetes mellitus without complications: Secondary | ICD-10-CM | POA: Insufficient documentation

## 2022-07-15 NOTE — Progress Notes (Signed)
Follow-up visit:  Post-Operative sleeve Surgery  Surgery date: 12/08/2020 Surgery type: sleeve gastrectomy Start weight at [facility]: 240.3 Weight today: 211 pounds    Body Composition Scale 02/02/2021 03/16/2021 06/14/2021 11/15/2021 07/15/2022  Current Body Weight 202.5 196 201.8 214.6 211  Total Body Fat % 35.3 34.2 35.3 37.0 36.9  Visceral Fat 8 8 8 9 9   Fat-Free Mass % 64.6 65.7 64.6 62.9 63   Total Body Water % 46.8 47.3 46.8 45.9 46  Muscle-Mass lbs 35.7 35.6 35.6 36.0 35.5  BMI 29.6 28.7 29.5 31.1 30.9  Body Fat Displacement              Torso  lbs 44.2 41.5 44.0 49.3 48.2         Left Leg  lbs 8.8 8.3 8.8 9.8 9.6         Right Leg  lbs 8.8 8.3 8.8 9.8 9.6         Left Arm  lbs 4.4 4.1 4.4 4.9 4.8         Right Arm   lbs 4.4 4.1 4.4 4.9 4.8   Clinical  Medical hx: DM, hyperlipidemia  Medications: atorvastatin, spirolactone, xanax, vitamin D  Labs: A1C 6.7, triglycerides 158 Notable signs/symptoms: migraines Any previous deficiencies? Yes, vitamin D Supplements:  Bariatric Multivitamin and calcium Fluids: 64+ ounces  Pt states she recently got a spinal surgery and is in less pain which she is excited about.  Pt states she she checks her blood sugars after every meal: 109-140. Pt states she is afraid to change her diet until she gets allergy tested due to a reaction.  Pt stet she hopes to get to 200 pounds so she can get a paniculectomy.   24-Hr Dietary Recall: First Meal: cheese omelet + 3-4 strips bacon Snack:  berries Second Meal: salmon potatoes broccoli Snack:  sugar free yogurt Third Meal: salad: cesar salad  Snack:  Beverages:  plain water, sugar free strawberry water, diet gingerale  Information Reviewed/ Discussed During Appointment:  A balanced diet typically includes a variety of foods from different food groups, providing essential nutrients such as carbohydrates, proteins, fats, vitamins, and minerals.  Tips for maintaining a balanced eating  plan:  Focus on protein: Prioritize lean protein sources to support muscle preservation.  Incorporate lean proteins: Include sources of lean protein in your diet, such as poultry, fish, beans, legumes, tofu, and low-fat dairy products.  Choose whole grains: Opt for whole grains such as brown rice, quinoa, and whole wheat, as they contain more nutrients and fiber compared to refined grains.  Include healthy fats: Choose sources of healthy fats, such as avocados, nuts, seeds, and olive oil. Limit saturated and trans fats found in processed and fried foods.  Monitor portion sizes: Be mindful of portion sizes to avoid over or under eating. Eating in moderation is important for maintaining your health goals  Stay hydrated: Drink at least 64 ounces of water throughout the day. Water is essential for various bodily functions and can also help control hunger. Stay well-hydrated by sipping water throughout the day.  Plan meals and snacks: Planning meals in advance can help ensure that you have a balanced mix of nutrients throughout the day.  Be mindful when eating: Small, frequent meals: Consume small portions and eat slowly to avoid discomfort. Be present and engaged with your meal. Avoid distractions like television or electronic devices. Focus on the act of eating and the sensory experience of the food. This will help you with  any emotional eating such as eating out of boredom  By practicing mindful eating and listening to your body's cues, you can foster a healthier relationship with food, promote better digestion, and support overall well-being.  -bread choices made of whole grains -Inclusion of appropriate multivitamin and calcium supplements -Eat protein from plate first, then non-starchy vegetables, then complex carbohydrate. -add chicken to your salad  Fluid intake: adequate   Medications: See List Supplementation: multivitamin and calcium, plus Tums for reflux  Using straws:  no Drinking while eating: no Having you been chewing well: yes Chewing/swallowing difficulties: no Changes in vision: no Changes to mood/headaches: no Hair loss/Cahnges to skin/Changes to nails: no Any difficulty focusing or concentrating: no Sweating: no Dizziness/Lightheaded: no Palpitations: no  Carbonated beverages: no N/V/D/C/GAS: no Abdominal Pain: no Dumping syndrome: no  Recent physical activity:  ADL's  Progress Towards Goal(s):  In Progress Teaching method utilized: Patent attorney & Auditory  Demonstrated degree of understanding via: Teach Back  Readiness Level: Action Barriers to learning/adherence to lifestyle change: none identified  Handouts given during visit include:  Teaching Method Utilized:  Visual Auditory Hands on  Demonstrated degree of understanding via:  Teach Back   Monitoring/Evaluation:  Dietary intake, exercise, and body weight. Follow up in 6 months.

## 2022-07-29 NOTE — Progress Notes (Unsigned)
NEW PATIENT Date of Service/Encounter:  08/02/22 Referring provider: Sharlene Dory* Primary care provider: Sharlene Dory, DO  Subjective:  Maria Johnson is a 42 y.o. female with a PMHx of atopic dermatitis, vitamin D deficiency, OSA on CPAP, anxiety, depression, hyperlipidemia, sickle cell trait, type 2 diabetes, metabolic syndrome, migraines presenting today for evaluation of angioedema. History obtained from: chart review and patient.   Angioedema with urticaria: Had cervical fusion on C5 and C6 in May.  Around 9 days following the surgery, she started having itching.  She saw her surgeon for surgical follow-up and noticed that her left arm and fingers were swelling and her left side of lip. She called her PCP who sent her in prednisone and some other medications.  Her swelling worsened while awaiting RX and it was recommended she go to ER.  ER gave her steroids and told her to take her pain medication, follow-up with PCP PRN. She called her PCP who did a video chat and during this, her lip doubled in size and she was told to go to ER again. She was hospitalized and monitored over night without a cause to her selling. Since then has been having an itchy rash that pops up and moves around. She is not eating much due to recent surgery. She has ruled out food as a cause. When she was eating, it was the same foods at the hospital and there was never anything different or new. She did see her surgeon last month, was told they didn't suspect it was her antibiotics given during surgery given the duration of her symptoms as well as time of onset. Told maybe her narcotic-acetaminophen and codeine, could be cause of symptoms. Has had this medication before without symptoms. Has been off for the past 4 days, but hives have continued. Also told maybe her muscle relaxant was causing her symptoms, however, has not taken in several days and continues to have hives.  Swelling reduces in  24 hours. Pictures reviewed and consistent with urticaria and angioedema.  Chart review 06/22/2022-admission for left hand swelling with forearm pruritic rash.  Prescribed prednisone, Pepcid, Zyrtec and Benadryl at PCP on 05/21.  Referred to ED following day due to worsening swelling.  Treated with steroids CT soft tissue neck: Minimal subcutaneous induration of the lower left face without abscess or drainable fluid collection  Normal C1 esterase inhibitor Negative Smith antibodies  2002 testing-Dust, mold were positive. Strawberry was positive but she was told that it was a false positive because she eats and tolerates strawberry. She is having watery eyes, itchy nose and burning nose. She rubs her nose a lot.  She does take an OTC antihistamine for these symptoms. Would consider AIT. Tonsillectomy March 2007.   Eczema will occasionally flare on her back. She has followed with dermatologist in the past. Not using anything topically at this time. Feels nothing has been helpful for this rash. Has been told rash is fungal, but her dermatologist has said it is eczema.  Other allergy screening: Asthma: no Food allergy: no Medication allergy: no Hymenoptera allergy: no  Past Medical History: Past Medical History:  Diagnosis Date   Acne    takes doxocycline daily for acne   Anemia    Anginal pain (HCC)    Anxiety    Atopic dermatitis 04/04/2018   Back pain    Bilateral carpal tunnel syndrome 04/04/2018   Bronchitis    history of   Carpal tunnel syndrome on both sides  Chicken pox    Depression    Depression    Diabetes mellitus    Generalized anxiety disorder 01/08/2014   Heart murmur    Hirsutism 08/07/2019   Hyperlipidemia    Insomnia 08/24/2017   Mixed hyperlipidemia 08/23/2019   OSA on CPAP 09/25/2017   Osteochondritis dissecans of ankle, 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     ALPRAZolam (XANAX) 0.5 MG tablet Take 1 tablet (0.5 mg total) by mouth daily as needed for anxiety. 30 tablet 5   atorvastatin (LIPITOR) 40 MG tablet TAKE 1 TABLET(40 MG) BY MOUTH DAILY 90 tablet 3   EPINEPHrine 0.3 mg/0.3 mL IJ SOAJ injection Inject 0.3 mg into the muscle as needed for anaphylaxis. 2 each 3   hydrOXYzine (VISTARIL) 25 MG capsule Take 1 capsule (25 mg total) by mouth every 8 (eight) hours as needed for itching. 30 capsule 0   ibuprofen (ADVIL) 200 MG tablet Take 200 mg by mouth every 6 (six) hours as needed.     Multiple Vitamins-Minerals (MULTIVITAMIN WITH MINERALS) tablet Take 1 tablet by mouth daily.     naproxen (NAPROSYN) 500 MG tablet Take 1 tablet (500 mg total) by mouth 2 (two) times daily with a meal. 30 tablet 0   omeprazole (PRILOSEC) 20 MG capsule Take 1 capsule (20 mg total) by mouth daily. 30 capsule 0   ondansetron (ZOFRAN-ODT) 4 MG disintegrating tablet DISSOLVE 1 TABLET(4 MG) ON THE TONGUE EVERY 8 HOURS AS NEEDED FOR NAUSEA OR VOMITING 20 tablet 0   SUMAtriptan (IMITREX) 100 MG tablet Take 1 tab daily as needed for migraines. May repeat in 2 hours if headache persists or recurs. 10 tablet 2   famotidine (PEPCID) 40 MG tablet Take 1 tablet (40 mg total) by mouth daily for 21 days. 21 tablet 0   No current facility-administered medications for this visit.   Known Allergies:  Allergies  Allergen Reactions   Molds & Smuts Other (See Comments)    Sneezing, runny eyes and nose   Past Surgical History: Past Surgical History:  Procedure Laterality Date   ANKLE SURGERY   05/2003   torn cartilage  repair; left   ANTERIOR LUMBAR FUSION  01/06/2011   Procedure: ANTERIOR LUMBAR FUSION 1 LEVEL;  Surgeon: Dorian Heckle, MD;  Location: [facility];  Service: Neurosurgery;  Laterality: N/A;  Lumbar five-Sacral one  Anterior Lumbar Interbody Fusion with Instrumentation    BACK SURGERY     Laminectomy Sept 14, 2011,   DIAGNOSTIC LAPAROSCOPY  01/2002   removed scar tissue   LAMINECTOMY AND MICRODISCECTOMY LUMBAR SPINE  10/2009   L4, L5, S1   LAPAROSCOPIC GASTRIC SLEEVE RESECTION N/A 12/08/2020   Procedure: LAPAROSCOPIC GASTRIC SLEEVE RESECTION;  Surgeon: Berna Bue, MD;  Location: [facility];  Service: General;  Laterality: N/A;   LUMBAR FUSION  01/06/2011   L5-S1   TOE SURGERY  07/05/2020   TONSILLECTOMY  03/2005   UPPER GI  ENDOSCOPY N/A 12/08/2020   Procedure: UPPER GI ENDOSCOPY;  Surgeon: Berna Bue, MD;  Location: [facility];  Service: General;  Laterality: N/A;   VAGINAL HYSTERECTOMY  08/2007   partial; AUB   Family History: Family History  Problem Relation Age of Onset   Miscarriages / Stillbirths Mother    Hypertension Mother    Alcohol abuse Father    Depression Father    Drug abuse Father    Hyperlipidemia Father    Stroke Father    Anxiety disorder Father    Sleep apnea Father    Alcoholism Father    Depression Daughter    Diabetes Daughter    Hypertension Daughter    Miscarriages / [place] Daughter    Birth defects Son    Diabetes Maternal Grandmother    Heart attack Maternal Grandmother    Hypertension Maternal Grandmother    Stroke Maternal Grandmother    Diabetes Maternal Grandfather    Hypertension Maternal Grandfather    Diabetes Paternal Grandmother    Lupus Paternal Grandmother    Social History: [place] lives in a house built 60 years ago, no water damage, wood floors, heat pump eating, indoor dog and snake, no roaches, using dust mite protection on the bedding and pillows, no smoke exposure + HEPA filter in the  home.  [place]   ROS:  All other systems negative except as noted per HPI.  Objective:  Blood pressure 130/76, pulse 94, temperature 98 F (36.7 C), resp. rate 16, height 5\' 8"  (1.727 m), weight 215 lb 11.2 oz (97.8 kg), last menstrual period 08/09/2007, SpO2 99 %. Body mass index is 32.8 kg/m. Physical Exam:  General Appearance:  Alert, cooperative, no distress, appears stated age  Head:  Normocephalic, without obvious abnormality, atraumatic  Eyes:  Conjunctiva clear, EOM's intact  Nose: Nares normal, hypertrophic turbinates, normal mucosa, and no visible anterior polyps  Throat: Lips, tongue normal; teeth and gums normal, normal posterior oropharynx  Neck: Supple, symmetrical  Lungs:   clear to auscultation bilaterally, Respirations unlabored, no coughing  Heart:  regular rate and rhythm and no murmur, Appears well perfused  Extremities: No edema  Skin: Skin color, texture, turgor normal and no rashes or lesions on visualized portions of skin  Neurologic: No gross deficits   Diagnostics: Testing to Environmental panel and select foods. Adequate positive and negative controls Testing interpreted by myself, documented by staff.  Airborne Adult Perc - 08/02/22 1426     Time Antigen Placed 1426    Allergen Manufacturer Waynette Buttery    Location Back    Number of Test 55    1. Control-Buffer 50% Glycerol Negative    2. Control-Histamine 3+    3. Bahia Negative    4. French Southern Territories Negative    5. Johnson Negative    6. Kentucky Blue Negative    7. Meadow Fescue Negative    8. Perennial Rye Negative    9. Timothy Negative    10. Ragweed Mix Negative    11. Cocklebur Negative    12. Plantain,  English Negative    13. Baccharis Negative    14. Dog Fennel Negative    15. Russian Thistle Negative    16. Lamb's Quarters Negative    17. Sheep Sorrell Negative    18. Rough Pigweed Negative    19. Marsh Elder, Rough Negative    20. Mugwort, Common Negative     21. Box, Elder Negative    22. Cedar, red Negative  23. Sweet Gum Negative    24. Pecan Pollen Negative    25. Pine Mix Negative    26. Walnut, Black Pollen Negative    27. Red Mulberry Negative    28. Ash Mix Negative    29. Birch Mix Negative    30. Beech American Negative    31. Cottonwood, Guinea-Bissau Negative    32. Hickory, White Negative    33. Maple Mix Negative    34. Oak, [place] Mix Negative    35. Sycamore Eastern Negative    36. Alternaria Alternata Negative    37. Cladosporium Herbarum Negative    38. Aspergillus Mix Negative    39. Penicillium Mix Negative    40. Bipolaris Sorokiniana (Helminthosporium) 3+    41. Drechslera Spicifera (Curvularia) Negative    42. Mucor Plumbeus Negative    43. Fusarium Moniliforme Negative    44. Aureobasidium Pullulans (pullulara) Negative    45. Rhizopus Oryzae Negative    46. Botrytis Cinera 2+    47. Epicoccum Nigrum 2+    48. Phoma Betae Negative    49. Dust Mite Mix 4+    50. Cat Hair 10,000 BAU/ml 2+    51.  Dog Epithelia Negative    52. Mixed Feathers Negative    53. Horse Epithelia Negative    54. Cockroach, German Negative    55. Tobacco Leaf Negative             13 Food Perc - 08/02/22 1426       Test Information   Time Antigen Placed 1426    Allergen Manufacturer Waynette Buttery    Location Back    Number of allergen test 13      Food   1. Peanut Negative    2. Soybean Negative    3. Wheat Negative    4. Sesame Negative    5. Milk, Cow Negative    6. Casein Negative    7. Egg White, Chicken Negative    8. Shellfish Mix Negative    9. Fish Mix Negative    10. Cashew Negative    11. Walnut Food Negative    12. Almond Negative    13. Hazelnut Negative             Intradermal - 08/02/22 1453     Time Antigen Placed 1453    Allergen Manufacturer Waynette Buttery    Location Arm    Number of Test 14    Control Negative    Bahia Negative    French Southern Territories Negative    Johnson Negative    7 Grass Negative     Ragweed Mix Negative    Weed Mix Negative    Tree Mix Negative    Mold 1 Negative    Mold 2 Negative    Mold 4 Negative    Cat Negative    Dog Negative    Cockroach Negative            Labs: see below for urticaria Assessment and Plan  Chronic Idiopathic Urticaria with angioedema: - this is defined as hives lasting more than 6 weeks without an identifiable trigger - hives can be from a number of different sources including infections, allergies, vibration, temperature, pressure among many others other possible causes - often an identifiable cause is not determined - some potential triggers include: stress, illness, NSAIDs, aspirin, hormonal changes - you do not have any red flag symptoms to make [place] concerned about secondary causes of hives, but we will screen  for these for reassurance with: tryptase, chronic urticaria assay (looks at autoimmune form of hives) and thryoid labs - approximately 50% of patients with chronic hives can have some associated swelling of the face/lips/eyelids (this is not a cause for alarm and does not typically progress onto systemic allergic reactions)  Therapy Plan:  - increase zyrtec (cetirizine) to 10mg  twice daily - if hives remain uncontrolled, increase dose of zyrtec (cetirizine) to max dose of 20mg  (2 pills) twice daily- this is maximum dose - can increase or decrease dosing depending on symptom control to a maximum dose of 4 tablets of antihistamine daily. Wait until hives free for at least one month prior to decreasing dose.   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  History of eczema-no current flare  Follow up : 2 months, sooner if needed Call us back if you decide to do allergy injections. Let us know if you want to do RUSH (one long day then weekly, gets you to maintenance faster) or traditional (weekly from beginning, takes longer to get to maintenance) It was a pleasure meeting you in clinic today! Thank you for allowing me to participate in your care.  This note in its entirety was forwarded to the Provider who requested this consultation.  Thank you for your kind referral. I appreciate the opportunity to take part in [place]on's care. Please do not hesitate to contact me with questions.  Sincerely,  Tonny Bollman, MD [facility]

## 2022-08-02 ENCOUNTER — Encounter: Payer: Self-pay | Admitting: Internal Medicine

## 2022-08-02 ENCOUNTER — Ambulatory Visit: Payer: Managed Care, Other (non HMO) | Admitting: Internal Medicine

## 2022-08-02 ENCOUNTER — Other Ambulatory Visit: Payer: Self-pay

## 2022-08-02 VITALS — BP 130/76 | HR 94 | Temp 98.0°F | Resp 16 | Ht 68.0 in | Wt 215.7 lb

## 2022-08-02 DIAGNOSIS — J3089 Other allergic rhinitis: Secondary | ICD-10-CM | POA: Diagnosis not present

## 2022-08-02 DIAGNOSIS — L308 Other specified dermatitis: Secondary | ICD-10-CM

## 2022-08-02 DIAGNOSIS — T781XXA Other adverse food reactions, not elsewhere classified, initial encounter: Secondary | ICD-10-CM

## 2022-08-02 DIAGNOSIS — L508 Other urticaria: Secondary | ICD-10-CM | POA: Diagnosis not present

## 2022-08-02 NOTE — Patient Instructions (Addendum)
Chronic Idiopathic Urticaria: - this is defined as hives lasting more than 6 weeks without an identifiable trigger - hives can be from a number of different sources including infections, allergies, vibration, temperature, pressure among many others other possible causes - often an identifiable cause is not determined - some potential triggers include: stress, illness, NSAIDs, aspirin, hormonal changes - you do not have any red flag symptoms to make [place] concerned about secondary causes of hives, but we will screen for these for reassurance with:  tryptase, chronic urticaria assay (looks at autoimmune form of hives) and thryoid labs - approximately 50% of patients with chronic hives can have some associated swelling of the face/lips/eyelids (this is not a cause for alarm and does not typically progress onto systemic allergic reactions)  Therapy Plan:  - increase zyrtec (cetirizine) to 10mg  twice daily - if hives remain uncontrolled, increase dose of zyrtec (cetirizine) to max dose of 20mg  (2 pills) twice daily- this is maximum dose - can increase or decrease dosing depending on symptom control to a maximum dose of 4 tablets of antihistamine daily. Wait until hives free for at least one month prior to decreasing dose.   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  Follow up : 2 months, sooner if needed Call us back if you decide to do allergy injections. Let us know if you want to do RUSH (one long day then weekly, gets you to maintenance faster) or traditional (weekly from beginning, takes longer to get to maintenance) It was a pleasure meeting you in clinic today! Thank you for allowing me to participate in your care.  Tonny Bollman, MD [facility]     DUST MITE AVOIDANCE MEASURES:  There are three main measures that need and can be taken to avoid house dust mites:  Reduce accumulation of dust in general -reduce furniture, clothing, carpeting, books, stuffed animals, especially in bedroom  Separate yourself from the dust -use pillow and mattress encasements (can be found at stores such as [employer] or online) -avoid direct exposure to air condition flow -use a HEPA filter device, especially in the bedroom; you can also use a HEPA filter vacuum cleaner -wipe dust with a moist towel instead of a dry towel or broom when cleaning  Decrease mites and/or their secretions -wash clothing and linen and stuffed animals at highest temperature possible, at least every 2 weeks -stuffed animals can also be placed in a bag and put in a freezer overnight  Despite the above measures, it is impossible to eliminate dust mites or their allergen completely from your home.  With the above measures the burden of mites in your home can be diminished, with the goal of minimizing your  allergic symptoms.  Success will be reached only when implementing and using all means together. Control of Mold Allergen   Mold and fungi can grow on a variety of surfaces provided certain temperature and moisture conditions exist.  Outdoor molds grow on plants, decaying vegetation and soil.  The major  outdoor mold, Alternaria and Cladosporium, are found in very high numbers during hot and dry conditions.  Generally, a late Summer - Fall peak is seen for common outdoor fungal spores.  Rain will temporarily lower outdoor mold spore count, but counts rise rapidly when the rainy period ends.  The most important indoor molds are Aspergillus and Penicillium.  Dark, humid and poorly ventilated basements are ideal sites for mold growth.  The next most common sites of mold growth are the bathroom and the kitchen.  Outdoor (Seasonal) Mold Control  Use air conditioning and keep windows closed Avoid exposure to decaying vegetation. Avoid leaf raking. Avoid grain handling. Consider wearing a face mask if working in moldy areas.    Indoor (Perennial) Mold Control   Maintain humidity below 50%. Clean washable surfaces with 5% bleach solution. Remove sources e.g. contaminated carpets.

## 2022-08-03 ENCOUNTER — Other Ambulatory Visit: Payer: Self-pay | Admitting: Internal Medicine

## 2022-08-03 DIAGNOSIS — J3089 Other allergic rhinitis: Secondary | ICD-10-CM

## 2022-08-03 NOTE — Progress Notes (Signed)
Encounter created for AIT RUSH Rx

## 2022-08-04 ENCOUNTER — Encounter: Payer: Self-pay | Admitting: Family Medicine

## 2022-08-05 ENCOUNTER — Telehealth (INDEPENDENT_AMBULATORY_CARE_PROVIDER_SITE_OTHER): Payer: Managed Care, Other (non HMO) | Admitting: Family Medicine

## 2022-08-05 ENCOUNTER — Encounter: Payer: Self-pay | Admitting: Family Medicine

## 2022-08-05 DIAGNOSIS — R059 Cough, unspecified: Secondary | ICD-10-CM

## 2022-08-05 DIAGNOSIS — J0110 Acute frontal sinusitis, unspecified: Secondary | ICD-10-CM | POA: Diagnosis not present

## 2022-08-05 MED ORDER — DOXYCYCLINE HYCLATE 100 MG PO TABS
100.00 mg | ORAL_TABLET | Freq: Two times a day (BID) | ORAL | 0 refills | Status: AC
Start: 2022-08-07 — End: 2022-08-14

## 2022-08-05 MED ORDER — PROMETHAZINE-DM 6.25-15 MG/5ML PO SYRP: 5.0000 mL | ORAL_SOLUTION | Freq: Four times a day (QID) | ORAL | 0 refills | Status: DC | PRN

## 2022-08-05 MED ORDER — FLUCONAZOLE 150 MG PO TABS
ORAL_TABLET | ORAL | 0 refills | Status: DC
Start: 2022-08-05 — End: 2023-01-04

## 2022-08-05 MED ORDER — PREDNISONE 20 MG PO TABS
40.00 mg | ORAL_TABLET | Freq: Every day | ORAL | 0 refills | Status: AC
Start: 2022-08-05 — End: 2022-08-10

## 2022-08-05 NOTE — Progress Notes (Signed)
Chief Complaint  Patient presents with   Cough    congestion    Maria Johnson here for URI complaints. We are interacting via web portal for an electronic face-to-face visit. I verified patient's ID using 2 identifiers. Patient agreed to proceed with visit via this method. Patient is at home, I am at office. Patient and I are present for visit.   Duration: 8 days; getting worse over the past 3 d Associated symptoms: subj fevers, sinus congestion, sinus pain, rhinorrhea, sore throat, shortness of breath, and coughing Denies: ear pain, wheezing, myalgia, and dental pain Treatment to date: Zyrtec, Coricidin Sick contacts: No  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   Pes planus 10/22/2015   Pneumonia    Sickle cell trait (HCC)    Sleep apnea    has cpap   SOBOE (shortness of breath on exertion)    Sprain of medial collateral ligament of left knee 10/30/2018   Type 2 diabetes mellitus with hyperglycemia (HCC) 08/12/2013   Vitamin D deficiency 08/24/2017   Well adult exam 09/25/2017    Objective No conversational dyspnea Age appropriate judgment and insight Nml affect and mood  Acute frontal sinusitis, recurrence not specified - Plan: predniSONE (DELTASONE) 20 MG tablet, fluconazole (DIFLUCAN) 150 MG tablet, doxycycline (VIBRA-TABS) 100 MG tablet  Cough, unspecified type  Continue to push fluids, practice good hand hygiene, cover  mouth when coughing. Warned about possible drowsiness with cough syrup.  She was started on a 5-day prednisone burst 40 mg daily.  No improvement next couple days, she will take 7 days of doxycycline 100 mg twice daily.  Diflucan should she develop yeast infection from it.  Tylenol, push fluids. F/u prn. If starting to experience fevers, shaking, or shortness of breath, seek immediate care. Pt voiced understanding and agreement to the plan.  Jilda Roche [place], DO 08/05/22 2:32 PM

## 2022-08-08 ENCOUNTER — Other Ambulatory Visit: Payer: Self-pay | Admitting: Family Medicine

## 2022-08-08 DIAGNOSIS — J3089 Other allergic rhinitis: Secondary | ICD-10-CM | POA: Diagnosis not present

## 2022-08-08 MED ORDER — HYDROXYZINE PAMOATE 25 MG PO CAPS: 25.0000 mg | ORAL_CAPSULE | Freq: Three times a day (TID) | ORAL | 0 refills | Status: DC | PRN

## 2022-08-08 NOTE — Progress Notes (Signed)
Aeroallergen Immunotherapy  Ordering Provider: Dr. Tonny Bollman  Patient Details Name: Maria Johnson MRN: [id number] Date of Birth: 1980-02-06  Order 1 of 1  Vial Label: M/C/DM  0.2 ml (Volume)  1:20 Concentration -- Bipolaris sorokiniana 0.2 ml (Volume)  1:40 Concentration -- Botrytis cinerea 0.2 ml (Volume)  1:40 Concentration -- Epicoccum nigrum 0.5 ml (Volume)  1:10 Concentration -- Cat Hair 0.5 ml (Volume)   AU Concentration -- Mite Mix (DF 5,000 & DP 5,000)   1.6  ml Extract Subtotal 3.4  ml Diluent 5.0  ml Maintenance Total  Schedule:  RUSH Silver Vial (1:1,000,000): RUSH Blue Vial (1:100,000): RUSH Yellow Vial (1:10,000): RUSH Green Vial (1:1,000): Schedule B (6 doses) Red Vial (1:100): Schedule A (10 doses)  Special Instructions: RUSH, B green, A red

## 2022-08-12 LAB — TSH: TSH: 1.22 u[IU]/mL (ref 0.450–4.500)

## 2022-08-12 LAB — TRYPTASE: Tryptase: 7.6 ug/L (ref 2.2–13.2)

## 2022-08-12 LAB — CHRONIC URTICARIA: cu index: 3.8 (ref <10)

## 2022-08-15 NOTE — Progress Notes (Signed)
Please let Ms. Rane know that her hives labs are reassuring. She does not have an elevated CU index-which looks for autoimmune form of hives, her tryptases level is normal (looks to see if she has too many mast cells), and her thyroid levels are normal. I would continue our plan as discussed in clinic. I look forward to seeing her back next week.

## 2022-08-23 ENCOUNTER — Encounter: Payer: Self-pay | Admitting: Physician Assistant

## 2022-08-23 ENCOUNTER — Encounter: Payer: Self-pay | Admitting: Family Medicine

## 2022-08-23 ENCOUNTER — Ambulatory Visit (INDEPENDENT_AMBULATORY_CARE_PROVIDER_SITE_OTHER): Payer: Managed Care, Other (non HMO) | Admitting: Physician Assistant

## 2022-08-23 VITALS — BP 136/82 | HR 86 | Temp 98.2°F | Resp 20 | Wt 216.0 lb

## 2022-08-23 DIAGNOSIS — K047 Periapical abscess without sinus: Secondary | ICD-10-CM

## 2022-08-23 MED ORDER — AMOXICILLIN 875 MG PO TABS
875.0000 mg | ORAL_TABLET | Freq: Two times a day (BID) | ORAL | 0 refills | Status: DC
Start: 2022-08-23 — End: 2023-09-22

## 2022-08-23 MED ORDER — KETOROLAC TROMETHAMINE 10 MG PO TABS: 10.0000 mg | ORAL_TABLET | Freq: Four times a day (QID) | ORAL | 0 refills | Status: DC | PRN

## 2022-08-23 NOTE — Progress Notes (Signed)
Established patient visit   Patient: Maria Johnson   DOB: 13-Feb-1980   42 y.o. Female  MRN: [id number] Visit Date: 08/23/2022  Today's healthcare provider: Alfredia Ferguson, PA-C   Cc. Dental infection  Subjective    HPI  Discussed the use of AI scribe software for clinical note transcription with the patient, who gave verbal consent to proceed.  History of Present Illness   Pt with a history of cervical fusion surgery 06/08/22, presents with severe tooth pain that started on Thursday. The pain was so severe that it led to irritability and sleep disturbances. The patient sought help from the emergency room 08/19/22 due to the severity of the pain. The patient has been taking amoxicillin and ibuprofen to manage the pain, but the pain persists. She also completed her dose of amoxicillin. The patient also reports having allergic reactions--angioedema to pain medications, which led to hospitalization. Despite this she took hydrocodone she had from the surgery to help the pain. Denies lip or throat swelling but does report she broke out in a rash. The patient is scheduled to see a dentist on Tuesday to address the tooth pain.       Medications: Outpatient Medications Prior to Visit  Medication Sig   acetaminophen (TYLENOL) 500 MG tablet Take 500 mg by mouth every 6 (six) hours as needed.   ALPRAZolam (XANAX) 0.5 MG tablet Take 1 tablet (0.5 mg total) by mouth daily as needed for anxiety.   atorvastatin (LIPITOR) 40 MG tablet TAKE 1 TABLET(40 MG) BY MOUTH DAILY   EPINEPHrine 0.3 mg/0.3 mL IJ SOAJ injection Inject 0.3 mg into the muscle as needed for anaphylaxis.   fluconazole (DIFLUCAN) 150 MG tablet Take 1 tab, repeat in 72 hours if no improvement.   hydrOXYzine (VISTARIL) 25 MG capsule Take 1 capsule (25 mg total) by mouth every 8 (eight) hours as needed for itching.   Multiple Vitamins-Minerals (MULTIVITAMIN WITH MINERALS) tablet Take 1 tablet by mouth daily.   omeprazole (PRILOSEC) 20  MG capsule Take 1 capsule (20 mg total) by mouth daily.   ondansetron (ZOFRAN-ODT) 4 MG disintegrating tablet DISSOLVE 1 TABLET(4 MG) ON THE TONGUE EVERY 8 HOURS AS NEEDED FOR NAUSEA OR VOMITING   promethazine-dextromethorphan (PROMETHAZINE-DM) 6.25-15 MG/5ML syrup Take 5 mLs by mouth 4 (four) times daily as needed for cough.   SUMAtriptan (IMITREX) 100 MG tablet Take 1 tab daily as needed for migraines. May repeat in 2 hours if headache persists or recurs.   [DISCONTINUED] ibuprofen (ADVIL) 200 MG tablet Take 200 mg by mouth every 6 (six) hours as needed.   [DISCONTINUED] naproxen (NAPROSYN) 500 MG tablet Take 1 tablet (500 mg total) by mouth 2 (two) times daily with a meal.   famotidine (PEPCID) 40 MG tablet Take 1 tablet (40 mg total) by mouth daily for 21 days.   No facility-administered medications prior to visit.     Objective    Blood pressure 136/82, pulse 86, temperature 98.2 F (36.8 C), temperature source Oral, resp. rate 20, weight 216 lb (98 kg), last menstrual period 08/09/2007, SpO2 95%.    Physical Exam Vitals reviewed.  Constitutional:      Appearance: She is not ill-appearing.  HENT:     Head: Normocephalic.     Comments: Visible hole in molar on left lower side  No surrounding erythema or abscess.  No cervical lymphadenopathy Eyes:     Conjunctiva/sclera: Conjunctivae normal.  Cardiovascular:     Rate and Rhythm: Normal rate.  Pulmonary:     Effort: Pulmonary effort is normal. No respiratory distress.  Neurological:     General: No focal deficit present.     Mental Status: She is alert and oriented to person, place, and time.  Psychiatric:        Mood and Affect: Mood normal.        Behavior: Behavior normal.     No results found for any visits on 08/23/22.  Assessment & Plan     1. Dental infection  -Extend Amoxicillin for another 5 days. Rx toradol for pain management. Strongly recommending to d/c hydrocodone, caution for repeat angioedema or  anaphylaxis.  Advised not to take with other NSAIDs, but Tylenol is okay. -Advise to see a dentist on Tuesday.       - ketorolac (TORADOL) 10 MG tablet; Take 1 tablet (10 mg total) by mouth every 6 (six) hours as needed.  Dispense: 20 tablet; Refill: 0 - amoxicillin (AMOXIL) 875 MG tablet; Take 1 tablet (875 mg total) by mouth 2 (two) times daily for 5 days.  Dispense: 10 tablet; Refill: 0   Return if symptoms worsen or fail to improve.      I, Alfredia Ferguson, PA-C have reviewed all documentation for this visit. The documentation on  08/23/22   for the exam, diagnosis, procedures, and orders are all accurate and complete.   Alfredia Ferguson, PA-C  [facility] at [facility] [phone] (phone) [phone] (fax)  [facility]

## 2022-08-24 ENCOUNTER — Other Ambulatory Visit: Payer: Self-pay

## 2022-08-24 MED ORDER — LEVOCETIRIZINE DIHYDROCHLORIDE 5 MG PO TABS: ORAL_TABLET | ORAL | 0 refills | Status: DC

## 2022-08-24 MED ORDER — MONTELUKAST SODIUM 10 MG PO TABS: ORAL_TABLET | ORAL | 0 refills | Status: DC

## 2022-08-24 MED ORDER — PREDNISONE 20 MG PO TABS: ORAL_TABLET | ORAL | 0 refills | Status: DC

## 2022-08-26 ENCOUNTER — Ambulatory Visit (INDEPENDENT_AMBULATORY_CARE_PROVIDER_SITE_OTHER): Payer: Managed Care, Other (non HMO) | Admitting: Internal Medicine

## 2022-08-26 ENCOUNTER — Encounter: Payer: Self-pay | Admitting: Internal Medicine

## 2022-08-26 VITALS — BP 110/72 | HR 88 | Temp 97.9°F | Resp 16

## 2022-08-26 DIAGNOSIS — J3089 Other allergic rhinitis: Secondary | ICD-10-CM | POA: Diagnosis not present

## 2022-08-26 NOTE — Progress Notes (Signed)
RAPID DESENSITIZATION Note  RE: Maria Johnson MRN: [id number] DOB: [birth year] Date of Office Visit: 08/26/2022  Subjective:  Patient presents today for rapid desensitization.  Interval History: Patient has not been ill, she has taken all premedications as per protocol.  Recent/Current History: Pulmonary disease: no Cardiac disease: no Respiratory infection: no Rash: no Itch: no Swelling: no Cough: no Shortness of breath: no Runny/stuffy nose: no Itchy eyes: no Beta-blocker use: no  Patient/guardian was informed of the procedure with verbalized understanding of the risk of anaphylaxis. Consent has been signed.   Medication List:  Current Outpatient Medications  Medication Sig Dispense Refill   acetaminophen (TYLENOL) 500 MG tablet Take 500 mg by mouth every 6 (six) hours as needed.     ALPRAZolam (XANAX) 0.5 MG tablet Take 1 tablet (0.5 mg total) by mouth daily as needed for anxiety. 30 tablet 5   amoxicillin (AMOXIL) 875 MG tablet Take 1 tablet (875 mg total) by mouth 2 (two) times daily for 5 days. 10 tablet 0   atorvastatin (LIPITOR) 40 MG tablet TAKE 1 TABLET(40 MG) BY MOUTH DAILY 90 tablet 3   EPINEPHrine 0.3 mg/0.3 mL IJ SOAJ injection Inject 0.3 mg into the muscle as needed for anaphylaxis. 2 each 3   famotidine (PEPCID) 40 MG tablet Take 1 tablet (40 mg total) by mouth daily for 21 days. 21 tablet 0   fluconazole (DIFLUCAN) 150 MG tablet Take 1 tab, repeat in 72 hours if no improvement. 2 tablet 0   hydrOXYzine (VISTARIL) 25 MG capsule Take 1 capsule (25 mg total) by mouth every 8 (eight) hours as needed for itching. 30 capsule 0   ketorolac (TORADOL) 10 MG tablet Take 1 tablet (10 mg total) by mouth every 6 (six) hours as needed. 20 tablet 0   levocetirizine (XYZAL) 5 MG tablet Take 1 tab Thursday and Friday morning before RUSH. 2 tablet 0   montelukast (SINGULAIR) 10 MG tablet Take 1 tab Thursday and Friday morning before RUSH appt. 2 tablet 0   Multiple  Vitamins-Minerals (MULTIVITAMIN WITH MINERALS) tablet Take 1 tablet by mouth daily.     omeprazole (PRILOSEC) 20 MG capsule Take 1 capsule (20 mg total) by mouth daily. 30 capsule 0   ondansetron (ZOFRAN-ODT) 4 MG disintegrating tablet DISSOLVE 1 TABLET(4 MG) ON THE TONGUE EVERY 8 HOURS AS NEEDED FOR NAUSEA OR VOMITING 20 tablet 0   predniSONE (DELTASONE) 20 MG tablet Take 2 tabs Thursday and Friday morning before RUSH appt. 4 tablet 0   promethazine-dextromethorphan (PROMETHAZINE-DM) 6.25-15 MG/5ML syrup Take 5 mLs by mouth 4 (four) times daily as needed for cough. 118 mL 0   SUMAtriptan (IMITREX) 100 MG tablet Take 1 tab daily as needed for migraines. May repeat in 2 hours if headache persists or recurs. 10 tablet 2   No current facility-administered medications for this visit.   Allergies: Allergies  Allergen Reactions   Hydrocodone Swelling   Molds & Smuts Other (See Comments)    Sneezing, runny eyes and nose   I reviewed her past medical history, social history, family history, and environmental history and no significant changes have been reported from her previous visit.  ROS: Negative except as per HPI.  Objective: LMP 08/09/2007  There is no height or weight on file to calculate BMI.   General Appearance:  Alert, cooperative, no distress, appears stated age  Head:  Normocephalic, without obvious abnormality, atraumatic  Eyes:  Conjunctiva clear, EOM's intact  Nose: Nares normal  Throat: Lips, tongue  normal; teeth and gums normal, normal posterior oropharnyx  Neck: Supple, symmetrical  Lungs:   CTAB, Respirations unlabored, no coughing  Heart:  Appears well perfused  Extremities: No edema  Skin: Skin color, texture, turgor normal, no rashes or lesions on visualized portions of skin  Neurologic: No gross deficits   Diagnostics:  PROCEDURES:  Patient received the following doses every hour: Step 1:  0.83ml - 1:1,000,000 dilution (silver vial) Step 2:  0.62ml -  1:1,000,000 dilution (silver vial) Step 3: 0.59ml - 1:100,000 dilution (blue vial)  Step 4: 0.7ml - 1:100,000 dilution (blue vial)  Step 5: 0.67ml - 1:10,000 dilution (gold vial) Step 6: 0.51ml - 1:10,000 dilution (gold vial) Step 7: 0.30ml - 1:10,000 dilution (gold vial) Step 8: 0.57ml - 1:10,000 dilution (gold vial)  Patient was observed for 1 hour after the last dose.   Procedure started at 8:30 AM Procedure ended at 3:31 PM   ASSESSMENT/PLAN:   Patient has tolerated the rapid desensitization protocol.  Next appointment: Start at 0.96ml of 1:1000 dilution (green vial) and build up per protocol.

## 2022-09-02 ENCOUNTER — Ambulatory Visit (INDEPENDENT_AMBULATORY_CARE_PROVIDER_SITE_OTHER): Payer: Managed Care, Other (non HMO)

## 2022-09-02 DIAGNOSIS — J301 Allergic rhinitis due to pollen: Secondary | ICD-10-CM | POA: Diagnosis not present

## 2022-09-08 ENCOUNTER — Ambulatory Visit (INDEPENDENT_AMBULATORY_CARE_PROVIDER_SITE_OTHER): Payer: Managed Care, Other (non HMO)

## 2022-09-08 DIAGNOSIS — J301 Allergic rhinitis due to pollen: Secondary | ICD-10-CM

## 2022-09-15 ENCOUNTER — Other Ambulatory Visit: Payer: Self-pay

## 2022-09-16 ENCOUNTER — Ambulatory Visit (INDEPENDENT_AMBULATORY_CARE_PROVIDER_SITE_OTHER): Payer: Managed Care, Other (non HMO)

## 2022-09-16 DIAGNOSIS — J309 Allergic rhinitis, unspecified: Secondary | ICD-10-CM

## 2022-09-23 ENCOUNTER — Ambulatory Visit (INDEPENDENT_AMBULATORY_CARE_PROVIDER_SITE_OTHER): Payer: Managed Care, Other (non HMO)

## 2022-09-23 DIAGNOSIS — J309 Allergic rhinitis, unspecified: Secondary | ICD-10-CM

## 2022-09-30 ENCOUNTER — Ambulatory Visit (INDEPENDENT_AMBULATORY_CARE_PROVIDER_SITE_OTHER): Payer: Managed Care, Other (non HMO)

## 2022-09-30 DIAGNOSIS — J309 Allergic rhinitis, unspecified: Secondary | ICD-10-CM | POA: Diagnosis not present

## 2022-10-04 ENCOUNTER — Ambulatory Visit (INDEPENDENT_AMBULATORY_CARE_PROVIDER_SITE_OTHER): Payer: Managed Care, Other (non HMO)

## 2022-10-04 DIAGNOSIS — J309 Allergic rhinitis, unspecified: Secondary | ICD-10-CM

## 2022-10-12 ENCOUNTER — Ambulatory Visit (INDEPENDENT_AMBULATORY_CARE_PROVIDER_SITE_OTHER): Payer: Managed Care, Other (non HMO)

## 2022-10-12 DIAGNOSIS — J309 Allergic rhinitis, unspecified: Secondary | ICD-10-CM

## 2022-10-19 ENCOUNTER — Ambulatory Visit (INDEPENDENT_AMBULATORY_CARE_PROVIDER_SITE_OTHER): Payer: Self-pay

## 2022-10-19 DIAGNOSIS — J309 Allergic rhinitis, unspecified: Secondary | ICD-10-CM

## 2022-10-28 ENCOUNTER — Ambulatory Visit (INDEPENDENT_AMBULATORY_CARE_PROVIDER_SITE_OTHER): Payer: Managed Care, Other (non HMO) | Admitting: *Deleted

## 2022-10-28 DIAGNOSIS — J309 Allergic rhinitis, unspecified: Secondary | ICD-10-CM

## 2022-11-04 ENCOUNTER — Ambulatory Visit (INDEPENDENT_AMBULATORY_CARE_PROVIDER_SITE_OTHER): Payer: Managed Care, Other (non HMO)

## 2022-11-04 DIAGNOSIS — J309 Allergic rhinitis, unspecified: Secondary | ICD-10-CM | POA: Diagnosis not present

## 2022-11-10 ENCOUNTER — Ambulatory Visit (INDEPENDENT_AMBULATORY_CARE_PROVIDER_SITE_OTHER): Payer: Managed Care, Other (non HMO)

## 2022-11-10 DIAGNOSIS — J309 Allergic rhinitis, unspecified: Secondary | ICD-10-CM | POA: Diagnosis not present

## 2022-11-14 ENCOUNTER — Ambulatory Visit (INDEPENDENT_AMBULATORY_CARE_PROVIDER_SITE_OTHER): Payer: Self-pay

## 2022-11-14 DIAGNOSIS — J309 Allergic rhinitis, unspecified: Secondary | ICD-10-CM | POA: Diagnosis not present

## 2022-11-21 ENCOUNTER — Ambulatory Visit (INDEPENDENT_AMBULATORY_CARE_PROVIDER_SITE_OTHER): Payer: Managed Care, Other (non HMO)

## 2022-11-21 DIAGNOSIS — J309 Allergic rhinitis, unspecified: Secondary | ICD-10-CM

## 2022-11-28 ENCOUNTER — Ambulatory Visit (INDEPENDENT_AMBULATORY_CARE_PROVIDER_SITE_OTHER): Payer: Self-pay

## 2022-11-28 DIAGNOSIS — J309 Allergic rhinitis, unspecified: Secondary | ICD-10-CM

## 2022-12-05 ENCOUNTER — Ambulatory Visit (INDEPENDENT_AMBULATORY_CARE_PROVIDER_SITE_OTHER): Payer: Managed Care, Other (non HMO) | Admitting: *Deleted

## 2022-12-05 DIAGNOSIS — J309 Allergic rhinitis, unspecified: Secondary | ICD-10-CM

## 2022-12-14 ENCOUNTER — Ambulatory Visit (INDEPENDENT_AMBULATORY_CARE_PROVIDER_SITE_OTHER): Payer: Managed Care, Other (non HMO)

## 2022-12-14 DIAGNOSIS — J309 Allergic rhinitis, unspecified: Secondary | ICD-10-CM

## 2022-12-15 DIAGNOSIS — J3089 Other allergic rhinitis: Secondary | ICD-10-CM | POA: Diagnosis not present

## 2022-12-15 NOTE — Progress Notes (Signed)
VIALS EXP 12-14-23

## 2022-12-21 ENCOUNTER — Ambulatory Visit (INDEPENDENT_AMBULATORY_CARE_PROVIDER_SITE_OTHER): Payer: Managed Care, Other (non HMO)

## 2022-12-21 DIAGNOSIS — J309 Allergic rhinitis, unspecified: Secondary | ICD-10-CM | POA: Diagnosis not present

## 2022-12-27 ENCOUNTER — Ambulatory Visit (INDEPENDENT_AMBULATORY_CARE_PROVIDER_SITE_OTHER): Payer: Managed Care, Other (non HMO)

## 2022-12-27 DIAGNOSIS — J309 Allergic rhinitis, unspecified: Secondary | ICD-10-CM | POA: Diagnosis not present

## 2023-01-04 ENCOUNTER — Ambulatory Visit (INDEPENDENT_AMBULATORY_CARE_PROVIDER_SITE_OTHER): Payer: Managed Care, Other (non HMO) | Admitting: Family Medicine

## 2023-01-04 ENCOUNTER — Ambulatory Visit (INDEPENDENT_AMBULATORY_CARE_PROVIDER_SITE_OTHER): Payer: Self-pay

## 2023-01-04 VITALS — BP 118/74 | HR 82 | Temp 97.9°F | Resp 16 | Ht 68.0 in | Wt 222.8 lb

## 2023-01-04 DIAGNOSIS — E1169 Type 2 diabetes mellitus with other specified complication: Secondary | ICD-10-CM

## 2023-01-04 DIAGNOSIS — E119 Type 2 diabetes mellitus without complications: Secondary | ICD-10-CM

## 2023-01-04 DIAGNOSIS — J309 Allergic rhinitis, unspecified: Secondary | ICD-10-CM | POA: Diagnosis not present

## 2023-01-04 DIAGNOSIS — F419 Anxiety disorder, unspecified: Secondary | ICD-10-CM | POA: Diagnosis not present

## 2023-01-04 DIAGNOSIS — Z23 Encounter for immunization: Secondary | ICD-10-CM | POA: Diagnosis not present

## 2023-01-04 DIAGNOSIS — T7840XA Allergy, unspecified, initial encounter: Secondary | ICD-10-CM

## 2023-01-04 DIAGNOSIS — Z7985 Long-term (current) use of injectable non-insulin antidiabetic drugs: Secondary | ICD-10-CM | POA: Diagnosis not present

## 2023-01-04 DIAGNOSIS — E785 Hyperlipidemia, unspecified: Secondary | ICD-10-CM

## 2023-01-04 LAB — COMPREHENSIVE METABOLIC PANEL
ALT: 21 U/L (ref 0–35)
AST: 19 U/L (ref 0–37)
Albumin: 4.2 g/dL (ref 3.5–5.2)
Alkaline Phosphatase: 64 U/L (ref 39–117)
BUN: 13 mg/dL (ref 6–23)
CO2: 30 meq/L (ref 19–32)
Calcium: 9.4 mg/dL (ref 8.4–10.5)
Chloride: 104 meq/L (ref 96–112)
Creatinine, Ser: 0.92 mg/dL (ref 0.40–1.20)
GFR: 76.72 mL/min (ref >60.00)
Glucose, Bld: 95 mg/dL (ref 70–99)
Potassium: 4 meq/L (ref 3.5–5.1)
Sodium: 138 meq/L (ref 135–145)
Total Bilirubin: 0.4 mg/dL (ref 0.2–1.2)
Total Protein: 6.8 g/dL (ref 6.0–8.3)

## 2023-01-04 LAB — HEMOGLOBIN A1C: Hgb A1c MFr Bld: 7 % — ABNORMAL HIGH (ref 4.6–6.5)

## 2023-01-04 LAB — LIPID PANEL
Cholesterol: 175 mg/dL (ref 0–200)
HDL: 48.2 mg/dL (ref >39.00)
LDL Cholesterol: 108 mg/dL — ABNORMAL HIGH (ref 0–99)
NonHDL: 126.54
Total CHOL/HDL Ratio: 4
Triglycerides: 92 mg/dL (ref 0.0–149.0)
VLDL: 18.4 mg/dL (ref 0.0–40.0)

## 2023-01-04 MED ORDER — TIRZEPATIDE 5 MG/0.5ML ~~LOC~~ SOAJ: 5.0000 mg | SUBCUTANEOUS | 0 refills | Status: DC

## 2023-01-04 MED ORDER — TIRZEPATIDE 7.5 MG/0.5ML ~~LOC~~ SOAJ: 7.5000 mg | SUBCUTANEOUS | 0 refills | Status: DC

## 2023-01-04 MED ORDER — CITALOPRAM HYDROBROMIDE 10 MG PO TABS: 10.0000 mg | ORAL_TABLET | Freq: Every day | ORAL | 3 refills | Status: DC

## 2023-01-04 MED ORDER — TIRZEPATIDE 2.5 MG/0.5ML ~~LOC~~ SOAJ: 2.5000 mg | SUBCUTANEOUS | 0 refills | Status: DC

## 2023-01-04 MED ORDER — FAMOTIDINE 40 MG PO TABS
40.0000 mg | ORAL_TABLET | Freq: Every day | ORAL | 1 refills | Status: DC
Start: 2023-01-04 — End: 2023-07-07

## 2023-01-04 MED ORDER — ALPRAZOLAM 0.5 MG PO TABS: 0.5000 mg | ORAL_TABLET | Freq: Every day | ORAL | 2 refills | Status: DC | PRN

## 2023-01-04 NOTE — Patient Instructions (Signed)
Give us 2-3 business days to get the results of your labs back.   Keep the diet clean and stay active.  Let us know if you need anything. 

## 2023-01-04 NOTE — Progress Notes (Signed)
Subjective:   Chief Complaint  Patient presents with   Follow-up    Follow up    Maria Johnson is a 42 y.o. female here for follow-up of diabetes.   Maria Johnson's self monitored glucose range is low 100's.  Patient denies hypoglycemic reactions. She checks Maria glucose levels 1 time(s) per day. Patient does not require insulin.   Medications include: diet controlled Diet is OK.  Exercise: cardio  Hyperlipidemia Patient presents for dyslipidemia follow up. Currently being treated with Lipitor 40 mg/d and compliance with treatment thus far has been good. She denies myalgias. Diet/exercise as above. No CP or SOB.  The patient is not known to have coexisting coronary artery disease.  GAD Patient takes alprazolam 0.5 mg daily as needed.  Prior to the last several weeks, it was infrequent.  Maria Johnson has been dealing with some alcohol issues and Maria Johnson is going through health issues as well.  Because of this, she has been responsible for childrearing Maria 21-year-old grandson.  She is now seeing a therapist.  She is not taking any daily medication.  No homicidal or suicidal ideation.  No self-medication.  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   Pes planus 10/22/2015   Pneumonia    Sickle cell trait (HCC)    Sleep apnea    has cpap   SOBOE (shortness of breath on exertion)    Sprain of medial collateral ligament of left knee  10/30/2018   Type 2 diabetes mellitus with hyperglycemia (HCC) 08/12/2013   Vitamin D deficiency 08/24/2017   Well adult exam 09/25/2017     Related testing: Retinal exam: Done Pneumovax: done  Objective:  BP 118/74 (BP Location: Left Arm, Patient Position: Sitting, Cuff Size: Normal)   Pulse 82   Temp 97.9 F (36.6 C) (Oral)   Resp 16   Ht 5\' 8"  (1.727 m)   Wt 222 lb 12.8 oz (101.1 kg)   LMP 08/09/2007   SpO2 98%   BMI 33.88 kg/m  General:  Well developed, well nourished, in no apparent distress Lungs:  CTAB, no access msc use Cardio:  RRR, no bruits, no LE edema Psych: Age appropriate judgment and insight  Assessment:   Type 2 diabetes mellitus without complication, without long-term current use of insulin (HCC) - Plan: Comprehensive metabolic panel, Lipid panel, Hemoglobin A1c, tirzepatide (MOUNJARO) 2.5 MG/0.5ML Pen, tirzepatide (MOUNJARO) 5 MG/0.5ML Pen, tirzepatide (MOUNJARO) 7.5 MG/0.5ML Pen  Hyperlipidemia associated with type 2 diabetes mellitus (HCC)  Anxiety - Plan: ALPRAZolam (XANAX) 0.5 MG tablet, citalopram (CELEXA) 10 MG tablet  Need for influenza vaccination - Plan: Flu vaccine trivalent PF, 6mos and older(Flulaval,Afluria,Fluarix,Fluzone)  Allergic reaction, initial encounter - Plan: famotidine (PEPCID) 40 MG tablet   Plan:   Chronic, stable.  Start Mounjaro 2.5 mg weekly and steadily uptitrate.  Counseled on diet and exercise. Chronic, stable.  Continue Lipitor 40 mg daily. Chronic, unstable.  Continue Xanax 0.5 mg twice daily as needed.  Add citalopram 10 mg daily.  She is following with a therapist.  Follow-up in 6 weeks to recheck this. Flu shot today. The patient voiced understanding and agreement to the plan.  Maria Roche [place], DO 01/04/23 10:36 AM

## 2023-01-05 ENCOUNTER — Other Ambulatory Visit: Payer: Self-pay

## 2023-01-05 ENCOUNTER — Encounter: Payer: Self-pay | Admitting: Family Medicine

## 2023-01-05 ENCOUNTER — Other Ambulatory Visit (HOSPITAL_COMMUNITY): Payer: Self-pay

## 2023-01-05 ENCOUNTER — Telehealth: Payer: Self-pay

## 2023-01-05 ENCOUNTER — Other Ambulatory Visit (HOSPITAL_BASED_OUTPATIENT_CLINIC_OR_DEPARTMENT_OTHER): Payer: Self-pay

## 2023-01-05 MED ORDER — TIRZEPATIDE 2.5 MG/0.5ML ~~LOC~~ SOAJ
2.5000 mg | SUBCUTANEOUS | 0 refills | Status: DC
  Filled 2023-01-05: qty 2, 28d supply, fill #0

## 2023-01-05 MED ORDER — MOUNJARO 7.5 MG/0.5ML ~~LOC~~ SOAJ
7.5000 mg | SUBCUTANEOUS | 0 refills | Status: DC
  Filled 2023-01-26 – 2023-02-21 (×3): qty 2, 28d supply, fill #0

## 2023-01-05 MED ORDER — MOUNJARO 5 MG/0.5ML ~~LOC~~ SOAJ
5.0000 mg | SUBCUTANEOUS | 0 refills | Status: DC
  Filled 2023-01-26: qty 2, 28d supply, fill #0

## 2023-01-05 NOTE — Telephone Encounter (Signed)
PA initiated via Covermymeds; KEY: [id number]. Awaiting determination.

## 2023-01-05 NOTE — Telephone Encounter (Signed)
Pharmacy Patient Advocate Encounter  Received notification from [facility] that Prior Authorization for [facility] 2.5MG /0.5ML auto-injectors has been APPROVED from 01/05/2023 to 01/04/2026. Ran test claim, Copay is $15.00. This test claim was processed through [facility]- copay amounts may vary at other pharmacies due to pharmacy/plan contracts, or as the patient moves through the different stages of their insurance plan.   PA #/Case ID/Reference #: [id number]

## 2023-01-09 ENCOUNTER — Ambulatory Visit: Payer: Managed Care, Other (non HMO) | Admitting: Skilled Nursing Facility1

## 2023-01-11 ENCOUNTER — Ambulatory Visit (INDEPENDENT_AMBULATORY_CARE_PROVIDER_SITE_OTHER): Payer: Self-pay

## 2023-01-11 DIAGNOSIS — J309 Allergic rhinitis, unspecified: Secondary | ICD-10-CM | POA: Diagnosis not present

## 2023-01-16 ENCOUNTER — Encounter: Payer: Self-pay | Admitting: Family Medicine

## 2023-01-19 ENCOUNTER — Ambulatory Visit (INDEPENDENT_AMBULATORY_CARE_PROVIDER_SITE_OTHER): Payer: Managed Care, Other (non HMO)

## 2023-01-19 DIAGNOSIS — J309 Allergic rhinitis, unspecified: Secondary | ICD-10-CM | POA: Diagnosis not present

## 2023-01-26 ENCOUNTER — Other Ambulatory Visit: Payer: Self-pay

## 2023-01-26 ENCOUNTER — Other Ambulatory Visit (HOSPITAL_BASED_OUTPATIENT_CLINIC_OR_DEPARTMENT_OTHER): Payer: Self-pay

## 2023-01-31 ENCOUNTER — Ambulatory Visit (INDEPENDENT_AMBULATORY_CARE_PROVIDER_SITE_OTHER): Payer: Managed Care, Other (non HMO)

## 2023-01-31 DIAGNOSIS — J309 Allergic rhinitis, unspecified: Secondary | ICD-10-CM

## 2023-02-06 ENCOUNTER — Ambulatory Visit: Payer: Managed Care, Other (non HMO) | Admitting: Skilled Nursing Facility1

## 2023-02-15 ENCOUNTER — Other Ambulatory Visit (HOSPITAL_BASED_OUTPATIENT_CLINIC_OR_DEPARTMENT_OTHER): Payer: Self-pay

## 2023-02-15 ENCOUNTER — Other Ambulatory Visit: Payer: Self-pay

## 2023-02-15 ENCOUNTER — Encounter: Payer: Self-pay | Admitting: Family Medicine

## 2023-02-15 ENCOUNTER — Ambulatory Visit: Payer: Managed Care, Other (non HMO) | Admitting: Family Medicine

## 2023-02-15 VITALS — BP 126/80 | HR 95 | Temp 97.8°F | Resp 16 | Ht 68.0 in | Wt 210.2 lb

## 2023-02-15 DIAGNOSIS — H9201 Otalgia, right ear: Secondary | ICD-10-CM | POA: Diagnosis not present

## 2023-02-15 DIAGNOSIS — F411 Generalized anxiety disorder: Secondary | ICD-10-CM | POA: Diagnosis not present

## 2023-02-15 DIAGNOSIS — F419 Anxiety disorder, unspecified: Secondary | ICD-10-CM

## 2023-02-15 MED ORDER — FLUTICASONE PROPIONATE 50 MCG/ACT NA SUSP
2.0000 | Freq: Every day | NASAL | 2 refills | Status: DC
  Filled 2023-02-15: qty 16, 30d supply, fill #0
  Filled 2023-03-13: qty 16, 30d supply, fill #1
  Filled 2023-04-12: qty 16, 30d supply, fill #2

## 2023-02-15 MED ORDER — CITALOPRAM HYDROBROMIDE 10 MG PO TABS
10.0000 mg | ORAL_TABLET | Freq: Every day | ORAL | 3 refills | Status: DC
Start: 2023-02-15 — End: 2023-09-22
  Filled 2023-02-15 – 2023-02-21 (×2): qty 90, 90d supply, fill #0
  Filled 2023-05-17: qty 90, 90d supply, fill #1
  Filled 2023-06-13: qty 90, 90d supply, fill #2

## 2023-02-15 NOTE — Progress Notes (Signed)
 Chief Complaint  Patient presents with   Follow-up    Follow up    Subjective Maria Johnson presents for f/u anxiety/depression.  Pt is currently being treated with Celexa  10 mg/d.  Reports doing well since treatment. No thoughts of harming self or others. No self-medication with alcohol, prescription drugs or illicit drugs. Pt is following with a counselor/psychologist.  Over the past remains, the patient have intermittent pain and intermittent ringing in her right ear.  No injury or change in activity.  There is no drainage, fevers, runny/stuffy nose, hearing loss.  When the ringing does occur, it is constant and not in the form of a heartbeat.  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 If you have any questions, ask your nurse or doctor.          acetaminophen  500 MG tablet Commonly known  as: TYLENOL  Take 500 mg by mouth every 6 (six) hours as needed.   ALPRAZolam  0.5 MG tablet Commonly known as: XANAX  Take 1 tablet (0.5 mg total) by mouth daily as needed for anxiety.   atorvastatin  40 MG tablet Commonly known as: LIPITOR TAKE 1 TABLET(40 MG) BY MOUTH DAILY   citalopram  10 MG tablet Commonly known as: CELEXA  Take 1 tablet (10 mg total) by mouth daily.   EPINEPHrine  0.3 mg/0.3 mL Soaj injection Commonly known as: EPI-PEN Inject 0.3 mg into the muscle as needed for anaphylaxis.   famotidine  40 MG tablet Commonly known as: Pepcid  Take 1 tablet (40 mg total) by mouth daily.   fluticasone  50 MCG/ACT nasal spray Commonly known as: FLONASE  Place 2 sprays into both nostrils daily. Started by: Shellie Dials Nellie Pester   levocetirizine 5 MG tablet Commonly known as: XYZAL  Take 1 tab Thursday and Friday morning before RUSH.   montelukast  10 MG tablet Commonly known as: SINGULAIR  Take 1 tab Thursday and Friday morning before RUSH appt.   Mounjaro  5 MG/0.5ML Pen Generic drug: tirzepatide  Inject 5 mg into the skin once a week. What changed: Another medication with the same name was removed. Continue taking this medication, and follow the  directions you see here. Changed by: Jobe Mulder   Mounjaro  7.5 MG/0.5ML Pen Generic drug: tirzepatide  Inject 7.5 mg into the skin once a week. What changed: Another medication with the same name was removed. Continue taking this medication, and follow the directions you see here. Changed by: Jobe Mulder   multivitamin with minerals tablet Take 1 tablet by mouth daily.   omeprazole  20 MG capsule Commonly known as: PRILOSEC Take 1 capsule (20 mg total) by mouth daily.   SUMAtriptan  100 MG tablet Commonly known as: Imitrex  Take 1 tab daily as needed for migraines. May repeat in 2 hours if headache persists or recurs.        Exam BP 126/80   Pulse 95   Temp 97.8 F (36.6 C) (Oral)   Resp 16   Ht  5\' 8"  (1.727 m)   Wt 210 lb 3.2 oz (95.3 kg)   LMP 08/09/2007   SpO2 96%   BMI 31.96 kg/m  General:  well developed, well nourished, in no apparent distress Ears: Canals are patent bilaterally, TMs negative bilaterally Lungs:  No respiratory distress Psych: well oriented with normal range of affect and age-appropriate judgement/insight, alert and oriented x4.  Assessment and Plan  GAD (generalized anxiety disorder)  Anxiety - Plan: citalopram  (CELEXA ) 10 MG tablet  Right ear pain - Plan: fluticasone  (FLONASE ) 50 MCG/ACT nasal spray  1/2.  Chronic, stable.  Continue Celexa  10 mg daily.  Continue Xanax  as needed.  She has not needed to use this since starting the medication.  Continue with the counseling team. 3.  She will start using Flonase .  If no improvement over the next month, she will let us  know and we will refer her to the ENT team.  No red flag signs/symptoms. I will see her in 5 months for a physical or as needed. The patient voiced understanding and agreement to the plan.  Shellie Dials [place], DO 02/15/23 8:28 AM

## 2023-02-15 NOTE — Patient Instructions (Addendum)
 Strong work losing weight.   Keep the diet clean and stay active.  When you are finishing your 7.5 mg dosage of Mounjaro , let me know if you want to continue to increase dosages.   Go on Flonase  daily. Send me a message if no improvement in the next month or so.   Let us  know if you need anything.

## 2023-02-21 ENCOUNTER — Other Ambulatory Visit: Payer: Self-pay

## 2023-02-21 ENCOUNTER — Other Ambulatory Visit (HOSPITAL_BASED_OUTPATIENT_CLINIC_OR_DEPARTMENT_OTHER): Payer: Self-pay

## 2023-03-02 ENCOUNTER — Ambulatory Visit (INDEPENDENT_AMBULATORY_CARE_PROVIDER_SITE_OTHER): Payer: Managed Care, Other (non HMO)

## 2023-03-02 DIAGNOSIS — J309 Allergic rhinitis, unspecified: Secondary | ICD-10-CM | POA: Diagnosis not present

## 2023-03-07 ENCOUNTER — Ambulatory Visit: Payer: Managed Care, Other (non HMO) | Admitting: Dermatology

## 2023-03-07 VITALS — BP 111/74

## 2023-03-07 DIAGNOSIS — L649 Androgenic alopecia, unspecified: Secondary | ICD-10-CM

## 2023-03-07 DIAGNOSIS — L658 Other specified nonscarring hair loss: Secondary | ICD-10-CM

## 2023-03-07 MED ORDER — SAFETY SEAL MISCELLANEOUS MISC: 1.0000 | Freq: Every morning | 3 refills | Status: DC

## 2023-03-07 NOTE — Progress Notes (Signed)
   New Patient Visit   Subjective  Maria Johnson is a 43 y.o. female who presents for the following:  Hair loss of left scalp since last fall. No new medications. She had cervical fusion surgery last May. She had an allergic reaction to the pain medication. She did have a dr tell her she had yeast on her scalp and treated her with Diflucan  (one dose) and clobetasol  solution. That cleared up the rash but she still has hair loss in the area.  She also has eczema on her back for several years. She was diagnosed in 1999 and it would come and go but now it just stays. She has tried clobetasol  and hydrocortisone  in the past. She was also given an antifungal in the past that did not help.   The following portions of the chart were reviewed this encounter and updated as appropriate: medications, allergies, medical history  Review of Systems:  No other skin or systemic complaints except as noted in HPI or Assessment and Plan.  Objective  Well appearing patient in no apparent distress; mood and affect are within normal limits.   A focused examination was performed of the following areas: Scalp   Relevant exam findings are noted in the Assessment and Plan.        Assessment & Plan   ANDROGENETIC ALOPECIA (FEMALE PATTERN HAIR LOSS) Exam: Diffuse thinning of the crown and widening of the midline part with retention of the frontal hairline  Assessment: Patient, 43 years old, presents with hair thinning and aggressive hair loss, experiencing early stages of androgenetic alopecia. Recent surgery in May likely triggered telogen effluvium, leading to noticeable hair shedding in the fall. Clinical examination revealed casts, indicating tension-related hair loss from tight hairstyles.   Plan:  Prescribe a combination of 8% minoxidil and finasteride topical solution from [facility] compound pharmacy.   Instruct patient to apply the topical solution daily, preferably in the morning.   Schedule a  follow-up appointment in 4 months for progress evaluation and comparison photos.   Recommend loosening hairstyles to prevent traction alopecia.   Suggest Viviscal supplements and collagen as optional adjunct therapy.   Advise against applying oils directly to the scalp to prevent yeast overgrowth.        Return in about 4 months (around 07/05/2023) for Alopecia.  I, Roseline Hutchinson, CMA, am acting as scribe for [employer], DO .   Documentation: I have reviewed the above documentation for accuracy and completeness, and I agree with the above.  Delon Lenis, DO

## 2023-03-07 NOTE — Patient Instructions (Addendum)
 Hello [name],  Thank you for visiting us  today.   Here is a summary of the key instructions from today's consultation:  Diagnosis: Androgenetic Alopecia  Medication: Begin using a compounded solution of 8% minoxidil mixed with finasteride, formulated by [facility] pharmacy. This is to be applied topically every day, ideally in the morning.   Application: Apply the solution in the morning to avoid unwanted hair growth from contact with your pillow at night.   Cost: The solution is $45, not covered by insurance.  Supplements: Consider using Viviscal supplements and collagen for additional support. Note that the primary results are expected from the prescription treatment.  Hair Care:   Avoid tight hairstyles to prevent tension hair loss.   Refrain from applying oils directly to your scalp to prevent yeast overgrowth.  Follow-Up: We took initial photos today and will compare them in a follow-up visit in four months to assess progress. We expect significant improvement within one hair growth cycle (about four months).  The prescription will be processed by [facility] pharmacy, which will contact you for verification and payment.  Please remember that consistent use of the treatment is crucial for its effectiveness. We look forward to seeing the positive changes at your next appointment.  Warm regards,  Dr. Delon Lenis, Dermatology         Your provider has sent your prescription to [facility] in [place], [place] . A pharmacy representative will call you to confirm details and take your payment information. If you do not receive a call within 24 hours, please contact the pharmacy at [phone] or [phone]. Your unique skincare compound is being formulated in our lab (most compounds take less than 24 hours). Your prescription is shipped vis USPS to your mailbox (2-4 business days). Priority shipping is available at an additional cost. Once received, you will electronically  sign/acknowledge that you received your prescription. The pharmacy hours are Monday-Friday 9 am-6 pm EST and Saturday 9 am-1 pm EST.     Important Information  Due to recent changes in healthcare laws, you may see results of your pathology and/or laboratory studies on MyChart before the doctors have had a chance to review them. We understand that in some cases there may be results that are confusing or concerning to you. Please understand that not all results are received at the same time and often the doctors may need to interpret multiple results in order to provide you with the best plan of care or course of treatment. Therefore, we ask that you please give us  2 business days to thoroughly review all your results before contacting the office for clarification. Should we see a critical lab result, you will be contacted sooner.   If You Need Anything After Your Visit  If you have any questions or concerns for your doctor, please call our main line at [phone] If no one answers, please leave a voicemail as directed and we will return your call as soon as possible. Messages left after 4 pm will be answered the following business day.   You may also send us  a message via MyChart. We typically respond to MyChart messages within 1-2 business days.  For prescription refills, please ask your pharmacy to contact our office. Our fax number is [phone].  If you have an urgent issue when the clinic is closed that cannot wait until the next business day, you can page your doctor at the number below.    Please note that while we do our best to  be available for urgent issues outside of office hours, we are not available 24/7.   If you have an urgent issue and are unable to reach us , you may choose to seek medical care at your doctor's office, retail clinic, urgent care center, or emergency room.  If you have a medical emergency, please immediately call 911 or go to the emergency department. In the  event of inclement weather, please call our main line at [phone] for an update on the status of any delays or closures.  Dermatology Medication Tips: Please keep the boxes that topical medications come in in order to help keep track of the instructions about where and how to use these. Pharmacies typically print the medication instructions only on the boxes and not directly on the medication tubes.   If your medication is too expensive, please contact our office at [phone] or send us  a message through MyChart.   We are unable to tell what your co-pay for medications will be in advance as this is different depending on your insurance coverage. However, we may be able to find a substitute medication at lower cost or fill out paperwork to get insurance to cover a needed medication.   If a prior authorization is required to get your medication covered by your insurance company, please allow us  1-2 business days to complete this process.  Drug prices often vary depending on where the prescription is filled and some pharmacies may offer cheaper prices.  The website www.goodrx.com contains coupons for medications through different pharmacies. The prices here do not account for what the cost may be with help from insurance (it may be cheaper with your insurance), but the website can give you the price if you did not use any insurance.  - You can print the associated coupon and take it with your prescription to the pharmacy.  - You may also stop by our office during regular business hours and pick up a GoodRx coupon card.  - If you need your prescription sent electronically to a different pharmacy, notify our office through [facility] or by phone at [phone]

## 2023-03-19 ENCOUNTER — Encounter: Payer: Self-pay | Admitting: Dermatology

## 2023-03-19 ENCOUNTER — Encounter: Payer: Self-pay | Admitting: Family Medicine

## 2023-03-20 ENCOUNTER — Other Ambulatory Visit (HOSPITAL_COMMUNITY): Payer: Self-pay

## 2023-03-20 ENCOUNTER — Other Ambulatory Visit: Payer: Self-pay | Admitting: Family Medicine

## 2023-03-20 ENCOUNTER — Other Ambulatory Visit (HOSPITAL_BASED_OUTPATIENT_CLINIC_OR_DEPARTMENT_OTHER): Payer: Self-pay

## 2023-03-20 MED ORDER — TIRZEPATIDE 10 MG/0.5ML ~~LOC~~ SOAJ
10.0000 mg | SUBCUTANEOUS | 0 refills | Status: AC
  Filled 2023-03-20: qty 2, 28d supply, fill #0

## 2023-03-20 MED ORDER — TIRZEPATIDE 15 MG/0.5ML ~~LOC~~ SOAJ
15.0000 mg | SUBCUTANEOUS | 1 refills | Status: DC
  Filled 2023-03-20: qty 6, 84d supply, fill #0

## 2023-03-20 MED ORDER — TIRZEPATIDE 12.5 MG/0.5ML ~~LOC~~ SOAJ
12.5000 mg | SUBCUTANEOUS | 0 refills | Status: DC
  Filled 2023-03-20 – 2023-04-12 (×2): qty 2, 28d supply, fill #0

## 2023-03-29 ENCOUNTER — Ambulatory Visit (INDEPENDENT_AMBULATORY_CARE_PROVIDER_SITE_OTHER): Payer: Self-pay

## 2023-03-29 DIAGNOSIS — J309 Allergic rhinitis, unspecified: Secondary | ICD-10-CM

## 2023-04-11 ENCOUNTER — Other Ambulatory Visit (HOSPITAL_BASED_OUTPATIENT_CLINIC_OR_DEPARTMENT_OTHER): Payer: Self-pay | Admitting: Obstetrics and Gynecology

## 2023-04-11 DIAGNOSIS — Z139 Encounter for screening, unspecified: Secondary | ICD-10-CM

## 2023-04-12 ENCOUNTER — Encounter: Payer: Self-pay | Admitting: Family Medicine

## 2023-04-12 ENCOUNTER — Telehealth

## 2023-04-12 ENCOUNTER — Other Ambulatory Visit (HOSPITAL_BASED_OUTPATIENT_CLINIC_OR_DEPARTMENT_OTHER): Payer: Self-pay

## 2023-04-12 ENCOUNTER — Other Ambulatory Visit: Payer: Self-pay

## 2023-04-13 ENCOUNTER — Other Ambulatory Visit: Payer: Self-pay

## 2023-04-13 ENCOUNTER — Emergency Department (HOSPITAL_BASED_OUTPATIENT_CLINIC_OR_DEPARTMENT_OTHER)

## 2023-04-13 ENCOUNTER — Other Ambulatory Visit (HOSPITAL_BASED_OUTPATIENT_CLINIC_OR_DEPARTMENT_OTHER): Payer: Self-pay

## 2023-04-13 ENCOUNTER — Encounter (HOSPITAL_BASED_OUTPATIENT_CLINIC_OR_DEPARTMENT_OTHER): Payer: Self-pay | Admitting: Emergency Medicine

## 2023-04-13 ENCOUNTER — Emergency Department (HOSPITAL_BASED_OUTPATIENT_CLINIC_OR_DEPARTMENT_OTHER)
Admission: EM | Admit: 2023-04-13 | Discharge: 2023-04-13 | Disposition: A | Discharge status: Home / Self Care | Attending: Emergency Medicine | Admitting: Emergency Medicine

## 2023-04-13 DIAGNOSIS — R0789 Other chest pain: Secondary | ICD-10-CM | POA: Insufficient documentation

## 2023-04-13 DIAGNOSIS — R21 Rash and other nonspecific skin eruption: Secondary | ICD-10-CM | POA: Diagnosis present

## 2023-04-13 DIAGNOSIS — T7840XA Allergy, unspecified, initial encounter: Secondary | ICD-10-CM | POA: Insufficient documentation

## 2023-04-13 LAB — BASIC METABOLIC PANEL
Anion gap: 7 (ref 5–15)
BUN: 15 mg/dL (ref 6–20)
CO2: 23 mmol/L (ref 22–32)
Calcium: 9.3 mg/dL (ref 8.9–10.3)
Chloride: 107 mmol/L (ref 98–111)
Creatinine, Ser: 1.2 mg/dL — ABNORMAL HIGH (ref 0.44–1.00)
GFR, Estimated: 58 mL/min — ABNORMAL LOW (ref >60)
Glucose, Bld: 106 mg/dL — ABNORMAL HIGH (ref 70–99)
Potassium: 4.1 mmol/L (ref 3.5–5.1)
Sodium: 137 mmol/L (ref 135–145)

## 2023-04-13 LAB — CBC
HCT: 38.2 % (ref 36.0–46.0)
Hemoglobin: 12.7 g/dL (ref 12.0–15.0)
MCH: 26.8 pg (ref 26.0–34.0)
MCHC: 33.2 g/dL (ref 30.0–36.0)
MCV: 80.8 fL (ref 80.0–100.0)
Platelets: 349 10*3/uL (ref 150–400)
RBC: 4.73 MIL/uL (ref 3.87–5.11)
RDW: 13.7 % (ref 11.5–15.5)
WBC: 6.3 10*3/uL (ref 4.0–10.5)
nRBC: 0 % (ref 0.0–0.2)

## 2023-04-13 LAB — TROPONIN I (HIGH SENSITIVITY)
Troponin I (High Sensitivity): <2 ng/L (ref <18)
Troponin I (High Sensitivity): <2 ng/L (ref <18)

## 2023-04-13 MED ORDER — PREDNISONE 10 MG PO TABS: 40.0000 mg | ORAL_TABLET | Freq: Every day | ORAL | 0 refills | Status: DC

## 2023-04-13 MED ORDER — PREDNISONE 50 MG PO TABS
60.0000 mg | ORAL_TABLET | Freq: Once | ORAL | Status: AC
  Administered 2023-04-13: 60 mg via ORAL
  Filled 2023-04-13: qty 1

## 2023-04-13 NOTE — Discharge Instructions (Signed)
 Take the prednisone as directed for the next 5 days.  Continue the Zyrtec.  Return for any new or worse symptoms.  Return for difficulty breathing throat tightening tongue swelling.  Follow-up with your doctor and your allergist.

## 2023-04-13 NOTE — ED Provider Notes (Addendum)
 [facility] Provider Note   CSN: [id number] Arrival date & time: 04/13/23  1533     History  Chief Complaint  Patient presents with   Chest Pain   Rash    Maria Johnson is a 43 y.o. female.  Patient known to have several allergies.  Is receiving allergy shots.  Patient starting yesterday started to breakout with an itchy rash.  Not involving lower extremities but face and neck.  No tongue swelling no lip swelling no throat closing no difficulty breathing.  Primary care doctor recommended she start Zyrtec and then Benadryl as needed.  Patient has been taking Zyrtec.  At 1500 today patient had some left anterior chest pain lasted for about an hour.  No nausea vomiting no shortness of breath.  That is now resolved.  Past medical history sniffing for diabetes bronchitis sleep apnea anxiety hyperlipidemia atopic dermatitis.  Patient not known to have any significant cardiac disease past surgical history significant for back surgery in 2011 lumbar fusion in 2012 ankle surgery ankle surgery in 2005 vaginal hysterectomy.  Upper endoscopy in November 2022 patient does not use tobacco products.  Patient does states that the rash itches.  In addition patient does have an EpiPen at home.       Home Medications Prior to Admission medications   Medication Sig Start Date End Date Taking? Authorizing Provider  acetaminophen (TYLENOL) 500 MG tablet Take 500 mg by mouth every 6 (six) hours as needed.    [provider]  ALPRAZolam Prudy Feeler) 0.5 MG tablet Take 1 tablet (0.5 mg total) by mouth daily as needed for anxiety. 01/04/23   Sharlene Dory, DO  atorvastatin (LIPITOR) 40 MG tablet TAKE 1 TABLET(40 MG) BY MOUTH DAILY 06/20/22   Wendling, Jilda Roche, DO  citalopram (CELEXA) 10 MG tablet Take 1 tablet (10 mg total) by mouth daily. 02/15/23   Sharlene Dory, DO  EPINEPHrine 0.3 mg/0.3 mL IJ SOAJ injection Inject 0.3 mg into the  muscle as needed for anaphylaxis. 06/22/22   Sharlene Dory, DO  famotidine (PEPCID) 40 MG tablet Take 1 tablet (40 mg total) by mouth daily. 01/04/23   Sharlene Dory, DO  fluticasone (FLONASE) 50 MCG/ACT nasal spray Place 2 sprays into both nostrils daily. 02/15/23   Sharlene Dory, DO  levocetirizine (XYZAL) 5 MG tablet Take 1 tab Thursday and Friday morning before RUSH. 08/24/22   Verlee Monte, MD  montelukast (SINGULAIR) 10 MG tablet Take 1 tab Thursday and Friday morning before RUSH appt. 08/24/22   Verlee Monte, MD  Multiple Vitamins-Minerals (MULTIVITAMIN WITH MINERALS) tablet Take 1 tablet by mouth daily.    [provider]  omeprazole (PRILOSEC) 20 MG capsule Take 1 capsule (20 mg total) by mouth daily. 05/01/22   Wallis Bamberg, PA-C  Safety Seal Miscellaneous MISC Apply 1 application  topically every morning. Medication Name: Hormonic Hair 03/07/23   Terri Piedra, DO  SUMAtriptan (IMITREX) 100 MG tablet Take 1 tab daily as needed for migraines. May repeat in 2 hours if headache persists or recurs. 08/17/21   Wendling, Jilda Roche, DO  tirzepatide [facility]) 10 MG/0.5ML Pen Inject 10 mg into the skin once a week for 28 days. 03/20/23 04/18/23  Sharlene Dory, DO  tirzepatide [facility]) 12.5 MG/0.5ML Pen Inject 12.5 mg into the skin once a week for 28 days. 04/17/23 05/15/23  Sharlene Dory, DO  tirzepatide [facility]) 15 MG/0.5ML Pen Inject 15 mg  into the skin once a week. 05/15/23   Sharlene Dory, DO  tirzepatide [facility]) 7.5 MG/0.5ML Pen Inject 7.5 mg into the skin once a week. 01/04/23   Sharlene Dory, DO      Allergies    Hydrocodone and Molds & smuts    Review of Systems   Review of Systems  Constitutional:  Negative for chills and fever.  HENT:  Negative for ear pain and sore throat.   Eyes:  Negative for pain and visual disturbance.  Respiratory:  Negative for cough and shortness of breath.   Cardiovascular:   Positive for chest pain. Negative for palpitations.  Gastrointestinal:  Negative for abdominal pain and vomiting.  Genitourinary:  Negative for dysuria and hematuria.  Musculoskeletal:  Negative for arthralgias and back pain.  Skin:  Positive for rash. Negative for color change.  Neurological:  Negative for seizures and syncope.  All other systems reviewed and are negative.   Physical Exam Updated Vital Signs BP 130/89   Pulse (!) 102   Temp (!) 97.2 F (36.2 C)   Resp 16   Ht 1.727 m (5\' 8" )   Wt 92.5 kg   LMP 08/09/2007   SpO2 98%   BMI 31.02 kg/m  Physical Exam Vitals and nursing note reviewed.  Constitutional:      General: She is not in acute distress.    Appearance: Normal appearance. She is well-developed. She is not ill-appearing.  HENT:     Head: Normocephalic and atraumatic.     Mouth/Throat:     Mouth: Mucous membranes are moist.     Pharynx: Oropharynx is clear.  Eyes:     Extraocular Movements: Extraocular movements intact.     Conjunctiva/sclera: Conjunctivae normal.     Pupils: Pupils are equal, round, and reactive to light.  Cardiovascular:     Rate and Rhythm: Normal rate and regular rhythm.     Heart sounds: No murmur heard. Pulmonary:     Effort: Pulmonary effort is normal. No respiratory distress.     Breath sounds: Normal breath sounds. No stridor. No wheezing, rhonchi or rales.  Chest:     Chest wall: No tenderness.  Abdominal:     Palpations: Abdomen is soft.     Tenderness: There is no abdominal tenderness.  Musculoskeletal:        General: No swelling.     Cervical back: Normal range of motion and neck supple.     Right lower leg: No edema.     Left lower leg: No edema.  Skin:    General: Skin is warm and dry.     Capillary Refill: Capillary refill takes less than 2 seconds.     Findings: Rash present.     Comments: Of a papular type rash not really hive-like in nature.  No vesicles.  No skin peeling.  Neurological:     General: No  focal deficit present.     Mental Status: She is alert and oriented to person, place, and time.  Psychiatric:        Mood and Affect: Mood normal.     ED Results / Procedures / Treatments   Labs (all labs ordered are listed, but only abnormal results are displayed) Labs Reviewed  BASIC METABOLIC PANEL - Abnormal; Notable for the following components:      Result Value   Glucose, Bld 106 (*)    Creatinine, Ser 1.20 (*)    GFR, Estimated 58 (*)    All other  components within normal limits  CBC  TROPONIN I (HIGH SENSITIVITY)  TROPONIN I (HIGH SENSITIVITY)    EKG EKG Interpretation Date/Time:  Thursday April 13 2023 15:50:07 EDT Ventricular Rate:  91 PR Interval:  147 QRS Duration:  93 QT Interval:  337 QTC Calculation: 415 R Axis:   47  Text Interpretation: Sinus rhythm Abnormal R-wave progression, early transition Borderline T wave abnormalities Confirmed by Vanetta Mulders [id number]) on 04/13/2023 6:22:57 PM  Radiology No results found.  Procedures Procedures    Medications Ordered in ED Medications  predniSONE (DELTASONE) tablet 60 mg (has no administration in time range)    ED Course/ Medical Decision Making/ A&P                                 Medical Decision Making Amount and/or Complexity of Data Reviewed Labs: ordered. Radiology: ordered.  Patient's rash seems to be allergic in nature.  Patient already taking Zyrtec.  Will give 60 mg of prednisone here.  Patient nontoxic no acute distress no tongue swelling no lip swelling no wheezing.  For the chest pain initial troponin was very normal CBC is normal basic metabolic panel normal with a creatinine 1.2 for a GFR of 58 patient will need delta troponin which will be ordered 2 view chest x-ray still pending.  EKG without any acute findings.  Patient does have an EpiPen at home.  Delta troponin without any acute changes.  Both less than 2.  Chest x-ray without any acute findings.  Patient had her 60 mg of  prednisone here no acute changes.  Still has the rash.  Patient stable for discharge home.  Final Clinical Impression(s) / ED Diagnoses Final diagnoses:  Atypical chest pain  Allergic reaction, initial encounter    Rx / DC Orders ED Discharge Orders     None         Vanetta Mulders, MD 04/13/23 [id number]    Vanetta Mulders, MD 04/13/23 2001

## 2023-04-13 NOTE — ED Triage Notes (Addendum)
 Pt POV c/o allergic rxn appx 1400 yesterday.  Took benadryl and zyrtec per PCP recommendation. Now c/o anxiety and chest pain appx 1 hr PTA, denies n/v/diaphoresis.   Rash noted to face and neck, c/o burning and itching.  Denies fever, nasal drainage, cough. Denies travel in last 2 weeks.

## 2023-04-14 ENCOUNTER — Ambulatory Visit: Admitting: Internal Medicine

## 2023-04-14 ENCOUNTER — Encounter: Payer: Self-pay | Admitting: Internal Medicine

## 2023-04-14 VITALS — BP 112/74 | HR 87 | Temp 97.5°F | Resp 18

## 2023-04-14 DIAGNOSIS — L2389 Allergic contact dermatitis due to other agents: Secondary | ICD-10-CM | POA: Diagnosis not present

## 2023-04-14 DIAGNOSIS — L508 Other urticaria: Secondary | ICD-10-CM

## 2023-04-14 DIAGNOSIS — J3089 Other allergic rhinitis: Secondary | ICD-10-CM | POA: Diagnosis not present

## 2023-04-14 MED ORDER — HYDROCORTISONE 2.5 % EX CREA: TOPICAL_CREAM | Freq: Two times a day (BID) | CUTANEOUS | 5 refills | Status: AC

## 2023-04-14 NOTE — Patient Instructions (Addendum)
 Contact Dermatitis  - Depo medrol 40mg  IM given today  - restart prednisone as prescribed by the ED tomorrow  - Start hydrcortisone 2.5% cream twice daily on affected areas  - Call us in 4-6 weeks to schedule patch testing   Patches are best placed on Monday with return to office on Wednesday and Friday of same week for readings.  Patches once placed should not get wet.  You do not have to stop any medications for patch testing but should not be on oral prednisone. You can schedule a patch testing visit when convenient for your schedule.    Continue all other medications as prescribed   Follow up: for patch testing (for NA 80s series, this will need to be placed in our [place] office, Wednesday, Friday appointments will be in [place])   Thank you so much for letting me partake in your care today.  Don't hesitate to reach out if you have any additional concerns!  Ferol Luz, MD  [facility] [place] [place]

## 2023-04-14 NOTE — Progress Notes (Signed)
 FOLLOW UP Date of Service/Encounter:  04/21/23  Subjective:  Maria Johnson (DOB: January 02, 1981) is a 43 y.o. female who [facility] in acute visit for allergic reaction  History obtained from: chart review and patient.  For Review, LV was on 08/26/22  with Dr.Dennis seen for  [facility] appointment  . See below for summary of history and diagnostics.    Today presents for follow-up. Discussed the use of AI scribe software for clinical note transcription with the patient, who gave verbal consent to proceed.  History of Present Illness   Maria Johnson is a 43 year old female who presents with a persistent facial rash and itching.  The facial rash and itching began four days ago, described as fixed and primarily located on her face with some extension to neck. She visited the ER last night due to a flare-up and was prescribed prednisone, taking two pills last night and four this morning, but reports no improvement in her symptoms.  She has been taking Zyrtec, increasing her dose to three times a day, with advice that she can take up to four. Additionally, she took Benadryl, but it did not alleviate her symptoms on the first day. Despite these treatments, there is no relief from the current regimen.  She denies any recent outdoor exposure or changes in personal care products, although she did have her nails done after the rash appeared. She has a history of sensitive skin, previously experiencing burning with cosmetic dermatology treatments for acne a decade ago.  She feels agitated and experiences chest pains since the onset of the rash, which she attributes to the discomfort and itchiness. She works from home and has not been around others, minimizing the likelihood of exposure to infectious agents.         All medications reviewed by clinical staff and updated in chart. No new pertinent medical or surgical history except as noted in HPI.  ROS:  All others negative except as noted per HPI.   Objective:  BP 112/74   Pulse 87   Temp (!) 97.5 F (36.4 C) (Temporal)   Resp 18   LMP 08/09/2007   SpO2 98%  There is no height or weight on file to calculate BMI. Physical Exam: General Appearance:  Alert, cooperative, no distress, appears stated age  Head:  Normocephalic, without obvious abnormality, atraumatic  Eyes:  Conjunctiva clear, EOM's intact  Ears EACs normal bilaterally  Nose: Nares normal, normal mucosa, no visible anterior polyps, and septum midline  Throat: Lips, tongue normal; teeth and gums normal, normal posterior oropharynx  Neck: Supple, symmetrical  Lungs:   clear to auscultation bilaterally, Respirations unlabored, no coughing  Heart:  regular rate and rhythm and no murmur, Appears well perfused  Extremities: No edema  Skin: Eczematous papules on face and neck , Skin color, texture, turgor normal, and no rashes or lesions on visualized portions of skin  Neurologic: No gross deficits   Labs:  Lab Orders  No laboratory test(s) ordered today     Assessment/Plan   Contact Dermatitis  - Depo medrol 40mg  IM given today  - restart prednisone as prescribed by the ED tomorrow  - Start hydrcortisone 2.5% cream twice daily on affected areas  - Call us in 4-6 weeks to schedule patch testing   Patches are best placed on Monday with return to office on Wednesday and Friday of same week for readings.  Patches once placed should not get  wet.  You do not have to stop any medications for patch testing but should not be on oral prednisone. You can schedule a patch testing visit when convenient for your schedule.    Continue all other medications as prescribed   Follow up: for patch testing (for NA 80s series, this will need to be placed in our [place] office, Wednesday, Friday appointments will be in [place])   Thank you so much for letting me partake in your care today.  Don't hesitate to reach out if you have any additional  concerns!  Ferol Luz, MD  [facility] [place], [place]

## 2023-04-17 ENCOUNTER — Ambulatory Visit: Admitting: Family Medicine

## 2023-04-17 DIAGNOSIS — J3089 Other allergic rhinitis: Secondary | ICD-10-CM | POA: Diagnosis not present

## 2023-04-17 NOTE — Progress Notes (Signed)
 VIAL MADE 04-17-23

## 2023-04-18 ENCOUNTER — Ambulatory Visit: Admitting: Family Medicine

## 2023-05-01 ENCOUNTER — Ambulatory Visit (INDEPENDENT_AMBULATORY_CARE_PROVIDER_SITE_OTHER)

## 2023-05-01 DIAGNOSIS — J309 Allergic rhinitis, unspecified: Secondary | ICD-10-CM | POA: Diagnosis not present

## 2023-05-07 NOTE — Progress Notes (Unsigned)
 Follow-up Note  RE: Maria Johnson MRN: [id number] DOB: [birth year] Date of Office Visit: 05/08/2023  Primary care provider: Sharlene Dory, DO Referring provider: Sharlene Dory*   Maria Johnson returns to the office today for the patch test placement, given suspected history of contact dermatitis.  Of note, on 05/05/2023 she visited the [facility] emergency department after falling down stairs and hitting her forehead.  At that time, she was transferred to [facility] for further evaluation.  Donzetta Matters, MD - 05/06/2023 Formatting of this note might be different from the original. 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 Neck CT dated 06/22/2022.  TECHNIQUE: Axial CT images of the brain from skull base to vertex, including portions of the face and sinuses, were obtained without contrast. Supplemental 2D reformatted images were generated and reviewed as needed.  All CT scans at [facility] are performed using radiation dose optimization techniques as appropriate to a performed exam, including but not limited to one or more of the following: automatic exposure control, adjustment of the mA and/or kV according to patient size, use of iterative reconstruction technique. In addition, our institution participates in a radiation dose monitoring program to optimize patient radiation exposure.  IMPRESSION: No significant interval change in the 7 mm hyperattenuating focus along the inferior aspect of the right frontal lobe.  In correlation with head CT images from 08/11/2016 and neck CT images from 06/22/2022, a small meningioma is considered more likely than acute hemorrhage.  Brain MRI without and with gadolinium contrast could further characterize, if clinically warranted. Exam  End: 05/06/23 13:40   Specimen Collected: 05/06/23 14:13 Last Resulted: 05/06/23 17:43  Received From: [facility]  Result Received: 05/07/23 19:30    Diagnostics: True Test patches placed.   Plan:   Allergic contact dermatitis - Instructions provided on care of the patches for the next 48 hours. - Maria Johnson was instructed to avoid showering for the next 48 hours. - Maria Johnson will follow up in 48 hours and 96 hours for patch readings.

## 2023-05-08 ENCOUNTER — Encounter: Payer: Self-pay | Admitting: Family Medicine

## 2023-05-08 ENCOUNTER — Ambulatory Visit: Admitting: Family Medicine

## 2023-05-08 DIAGNOSIS — L235 Allergic contact dermatitis due to other chemical products: Secondary | ICD-10-CM

## 2023-05-08 DIAGNOSIS — L259 Unspecified contact dermatitis, unspecified cause: Secondary | ICD-10-CM | POA: Insufficient documentation

## 2023-05-08 NOTE — Patient Instructions (Signed)
 Diagnostics: True Test patches placed.   Plan:   Allergic contact dermatitis - Instructions provided on care of the patches for the next 48 hours. - Maria Johnson was instructed to avoid showering for the next 48 hours. - Maria Johnson will follow up in 48 hours and 96 hours for patch readings.    Call the clinic if this treatment plan is not working well for you  Follow up in 2 days or sooner if needed.

## 2023-05-09 ENCOUNTER — Encounter: Payer: Self-pay | Admitting: Family Medicine

## 2023-05-09 ENCOUNTER — Ambulatory Visit: Admitting: Family Medicine

## 2023-05-09 VITALS — BP 128/80 | HR 80 | Temp 97.6°F | Ht 68.0 in | Wt 204.4 lb

## 2023-05-09 DIAGNOSIS — G939 Disorder of brain, unspecified: Secondary | ICD-10-CM | POA: Diagnosis not present

## 2023-05-09 DIAGNOSIS — D329 Benign neoplasm of meninges, unspecified: Secondary | ICD-10-CM

## 2023-05-09 DIAGNOSIS — S060XAA Concussion with loss of consciousness status unknown, initial encounter: Secondary | ICD-10-CM

## 2023-05-09 NOTE — Progress Notes (Signed)
 Chief Complaint  Patient presents with   Hospitalization Follow-up    Patient presents today for hospital follow-up from 05/05/23 due to a fall.    Subjective: Patient is a 43 y.o. female here for ED f/u.  On 05/05/2023, patient fell.  She does not report feeling dizzy, fluttering in her chest, lightheaded, or tripping.  Her face hit the concrete.  She scraped her toes as well.  She went to [facility] and had a trauma workup.  She had a brain lesion on CT scan thought to be a meningioma.  MRI was recommended for further clarification.  She does have a history of migraines but no other neurologic signs or symptoms.  She has a lingering headache and pain in her upper shoulder areas.  She has been taking Tylenol and ibuprofen with no effect on the headaches.  She has an appointment with the trauma team coming up.  She was also placed on Keppra for seizure prophylaxis for 7 days.  She has been compliant with this medication having no adverse effects.  Tetanus shot was updated.  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 No accessory muscle use Neuro: DTRs equal and symmetric throughout, no clonus, no cerebellar signs, gait is cautious, 5/5 strength throughout MSK: TTP over the trapezius musculature bilaterally Psych: Age appropriate judgment and insight, normal affect and mood  Assessment and Plan: Meningioma (HCC) - Plan: MR Brain W Wo Contrast  Brain lesion - Plan: MR Brain W Wo Contrast  Concussion with unknown loss of consciousness status, initial encounter  1/2.  Check MRI to further follow-up on this. 3.  Heat, ice, Tylenol, stretches, supplements for various concussion symptoms listed in AVS.  Could consider sports medicine referral if still not improving over the next several weeks.  Appreciate trauma team. The patient voiced understanding and agreement to the plan.  I spent 31 minutes with the patient discussing the above plans in addition to reviewing her chart the same day of the visit.  Jilda Roche [place], DO 05/09/23  12:01  PM

## 2023-05-09 NOTE — Patient Instructions (Addendum)
 Send me a message if you don't hear anything about your scan in the next few days.   Fish oil 3 grams daily for 10 days then 2 grams daily  Vitamin D 4000 IU daily  CoQ10 200mg  daily for headaches  Tart cherry extract any dose at night  To help improve COGNITIVE function: Using fish oil/omega 3 that is 1000 mg (or roughly 600 mg EPA/DHA), starting as soon as possible after concussion, take: 3 tabs THREE TIMES a day  for the first 3 days, then (you will smell a little, sorry) 3 tabs TWICE DAILY  for the next 3 days, then 3 tabs ONCE DAILY  for the next 10 days    To help reduce HEADACHES: Coenzyme Q10 160mg  ONCE DAILY Riboflavin/Vitamin B2 400mg  ONCE DAILY Magnesium oxide 400 mg ONE-TWO TIMES DAILY May stop after headaches are resolved.                                                                                               To help with INSOMNIA: Melatonin 3-5mg  AT BEDTIME Tart cherry extract, any dose at night    Other medicines to help decrease inflammation Alpha Lipoic Acid 100mg  TWICE DAILY Turmeric 500mg  twice daily Iron 65mg  elemental daily Vitamin D 4000 IU daily for 2 weeks then 2000 IU daily thereafter.  Let us know if you need anything.

## 2023-05-10 ENCOUNTER — Ambulatory Visit: Admitting: Internal Medicine

## 2023-05-10 ENCOUNTER — Other Ambulatory Visit (HOSPITAL_BASED_OUTPATIENT_CLINIC_OR_DEPARTMENT_OTHER): Payer: Self-pay

## 2023-05-10 ENCOUNTER — Other Ambulatory Visit: Payer: Self-pay | Admitting: Family Medicine

## 2023-05-10 ENCOUNTER — Encounter: Payer: Self-pay | Admitting: Family Medicine

## 2023-05-10 DIAGNOSIS — H9201 Otalgia, right ear: Secondary | ICD-10-CM

## 2023-05-10 DIAGNOSIS — L2389 Allergic contact dermatitis due to other agents: Secondary | ICD-10-CM

## 2023-05-10 MED ORDER — FLUTICASONE PROPIONATE 50 MCG/ACT NA SUSP
2.0000 | Freq: Every day | NASAL | 5 refills | Status: DC
  Filled 2023-05-10: qty 16, 30d supply, fill #0
  Filled 2023-06-13: qty 16, 30d supply, fill #1
  Filled 2023-07-28: qty 16, 30d supply, fill #2
  Filled 2023-09-04: qty 16, 30d supply, fill #3
  Filled 2023-10-03: qty 16, 30d supply, fill #4
  Filled 2023-11-05: qty 16, 30d supply, fill #5

## 2023-05-10 MED ORDER — TIRZEPATIDE 15 MG/0.5ML ~~LOC~~ SOAJ
15.0000 mg | SUBCUTANEOUS | 2 refills | Status: DC
  Filled 2023-05-10: qty 6, 84d supply, fill #0
  Filled 2023-07-28: qty 6, 84d supply, fill #1
  Filled 2023-10-08: qty 6, 84d supply, fill #2

## 2023-05-10 NOTE — Addendum Note (Signed)
 Addended by: Margo Common on: 05/10/2023 04:47 PM   Modules accepted: Orders

## 2023-05-10 NOTE — Progress Notes (Signed)
   Follow Up Note  RE: [name] MRN: [id number] DOB: 1980/03/24 Date of Office Visit: 05/10/2023  Referring provider: Jobe Mulder* Primary care provider: Jobe Mulder, DO  History of Present Illness: I had the pleasure of seeing Maria Johnson for a follow up visit at the [facility] on 05/13/2023. She is a 43 y.o. female, who is being followed for contact dermatitis . Today she is here for initial patch test interpretation, given suspected history of contact dermatitis.   Diagnostics:  TRUE TEST 48 hour reading: negative   T.R.U.E. Test - 05/10/23 1500       Test Information   Time Antigen Placed 1029    Manufacturer Other    Lot # [id number] Exp: 07/25/24    Location Back    Number of Test 36    Reading Interval Day 1;Day 3;Day 5    Panel Panel 1;Panel 2;Panel 3      Panel 1   1. Nickel Sulfate 0    2. Wool Alcohols 0    3. Neomycin Sulfate 0    4. Potassium Dichromate 0    5. Caine Mix 0    6. Fragrance Mix 0    7. Colophony 0    8. Paraben Mix 0    9. Negative Control 0    10. Balsam of [place] 0    11. Ethylenediamine Dihydrochloride 0    12. Cobalt Dichloride 0      Panel 2   13. p-tert Butylphenol Formaldehyde Resin 0    14. Epoxy Resin 0    15. Carba Mix 0    16.  Black Rubber Mix 0    17. Cl+ Me-Isothiazolinone 0    18. Quaternium-15 0    19. Methyldibromo Glutaronitrile 0    20. p-Phenylenediamine 0    21. Formaldehyde 0    22. Mercapto Mix 0    23. Thimerosal 0    24. Thiuram Mix 0      Panel 3   25. Diazolidinyl Urea 0    26. Quinoline Mix 0    27. Tixocortol-21-Pivalate 0    28. Gold Sodium Thiosulfate 0    29. Imidazolidinyl Urea 0    30. Budesonide 0    31. Hydrocortisone-17-Butyrate 0    32. Mercaptobenzothiazole 0    33. Bacitracin 0    34. Parthenolide 0    35. Disperse Blue 106 0    36. 2-Bromo-2-Nitropropane-1,3-diol 0              Assessment and Plan: Maria Johnson is a 43 y.o. female  with: Concern for Contact Dermatitis:  The patient has been provided detailed information regarding the substances she is sensitive to, as well as products containing the substances.  Meticulous avoidance of these substances is recommended. If avoidance is not possible, the use of barrier creams or lotions is recommended. If symptoms persist or progress despite meticulous avoidance of chemicals/substances above, dermatology evaluation may be warranted. No follow-ups on file.  It was my pleasure to see [name] today and participate in her care. Please feel free to contact me with any questions or concerns.  Sincerely,   Orelia Binet, MD [facility]

## 2023-05-11 ENCOUNTER — Encounter: Payer: Self-pay | Admitting: Family Medicine

## 2023-05-12 ENCOUNTER — Ambulatory Visit (HOSPITAL_BASED_OUTPATIENT_CLINIC_OR_DEPARTMENT_OTHER)
Admission: RE | Admit: 2023-05-12 | Discharge: 2023-05-12 | Disposition: A | Discharge status: Home / Self Care | Source: Ambulatory Visit | Attending: Family Medicine | Admitting: Family Medicine

## 2023-05-12 ENCOUNTER — Encounter: Payer: Self-pay | Admitting: Family

## 2023-05-12 ENCOUNTER — Ambulatory Visit (INDEPENDENT_AMBULATORY_CARE_PROVIDER_SITE_OTHER): Admitting: Family

## 2023-05-12 DIAGNOSIS — D329 Benign neoplasm of meninges, unspecified: Secondary | ICD-10-CM | POA: Diagnosis present

## 2023-05-12 DIAGNOSIS — L2389 Allergic contact dermatitis due to other agents: Secondary | ICD-10-CM | POA: Diagnosis not present

## 2023-05-12 DIAGNOSIS — G939 Disorder of brain, unspecified: Secondary | ICD-10-CM | POA: Insufficient documentation

## 2023-05-12 MED ORDER — GADOBUTROL 1 MMOL/ML IV SOLN
9.0000 mL | Freq: Once | INTRAVENOUS | Status: AC | PRN
  Administered 2023-05-12: 9 mL via INTRAVENOUS
  Filled 2023-05-12: qty 10

## 2023-05-12 NOTE — Progress Notes (Signed)
 Maria Johnson returns to the office today for the final patch test interpretation, given suspected history of contact dermatitis. She reports that she has not had the facial rash since the first incident.   Diagnostics:   TRUE TEST 96-hour hour reading: all negative   Plan:   Allergic contact dermatitis - 96 hour reading all negative - Follow up as needed.   Nehemiah Settle, FNP [facility]

## 2023-05-22 ENCOUNTER — Encounter (HOSPITAL_BASED_OUTPATIENT_CLINIC_OR_DEPARTMENT_OTHER): Payer: Self-pay | Admitting: Obstetrics and Gynecology

## 2023-05-29 ENCOUNTER — Encounter (HOSPITAL_BASED_OUTPATIENT_CLINIC_OR_DEPARTMENT_OTHER): Payer: Self-pay

## 2023-05-29 ENCOUNTER — Encounter: Payer: Self-pay | Admitting: Family Medicine

## 2023-05-29 ENCOUNTER — Ambulatory Visit (HOSPITAL_BASED_OUTPATIENT_CLINIC_OR_DEPARTMENT_OTHER)
Admission: RE | Admit: 2023-05-29 | Discharge: 2023-05-29 | Disposition: A | Discharge status: Home / Self Care | Source: Ambulatory Visit | Attending: Obstetrics and Gynecology | Admitting: Obstetrics and Gynecology

## 2023-05-29 DIAGNOSIS — Z139 Encounter for screening, unspecified: Secondary | ICD-10-CM

## 2023-05-29 DIAGNOSIS — Z1231 Encounter for screening mammogram for malignant neoplasm of breast: Secondary | ICD-10-CM | POA: Diagnosis not present

## 2023-05-30 ENCOUNTER — Ambulatory Visit (INDEPENDENT_AMBULATORY_CARE_PROVIDER_SITE_OTHER): Payer: Self-pay

## 2023-05-30 DIAGNOSIS — J309 Allergic rhinitis, unspecified: Secondary | ICD-10-CM | POA: Diagnosis not present

## 2023-05-31 ENCOUNTER — Other Ambulatory Visit: Payer: Self-pay

## 2023-05-31 DIAGNOSIS — D329 Benign neoplasm of meninges, unspecified: Secondary | ICD-10-CM

## 2023-06-05 ENCOUNTER — Encounter: Payer: Self-pay | Admitting: Family Medicine

## 2023-06-07 ENCOUNTER — Other Ambulatory Visit: Payer: Self-pay | Admitting: Family Medicine

## 2023-06-14 ENCOUNTER — Other Ambulatory Visit: Payer: Self-pay

## 2023-06-14 ENCOUNTER — Other Ambulatory Visit (HOSPITAL_BASED_OUTPATIENT_CLINIC_OR_DEPARTMENT_OTHER): Payer: Self-pay

## 2023-06-15 ENCOUNTER — Other Ambulatory Visit (HOSPITAL_BASED_OUTPATIENT_CLINIC_OR_DEPARTMENT_OTHER): Payer: Self-pay

## 2023-06-22 ENCOUNTER — Ambulatory Visit (INDEPENDENT_AMBULATORY_CARE_PROVIDER_SITE_OTHER)

## 2023-06-22 DIAGNOSIS — J309 Allergic rhinitis, unspecified: Secondary | ICD-10-CM

## 2023-06-28 ENCOUNTER — Ambulatory Visit (INDEPENDENT_AMBULATORY_CARE_PROVIDER_SITE_OTHER): Payer: Self-pay

## 2023-06-28 DIAGNOSIS — J309 Allergic rhinitis, unspecified: Secondary | ICD-10-CM

## 2023-06-30 ENCOUNTER — Other Ambulatory Visit: Payer: Self-pay | Admitting: Medical Genetics

## 2023-07-04 ENCOUNTER — Other Ambulatory Visit (HOSPITAL_COMMUNITY)
Admission: RE | Admit: 2023-07-04 | Discharge: 2023-07-04 | Disposition: A | Discharge status: Home / Self Care | Payer: Self-pay | Source: Ambulatory Visit | Attending: Medical Genetics | Admitting: Medical Genetics

## 2023-07-06 ENCOUNTER — Ambulatory Visit: Payer: Managed Care, Other (non HMO) | Admitting: Dermatology

## 2023-07-06 ENCOUNTER — Encounter: Payer: Self-pay | Admitting: Dermatology

## 2023-07-06 DIAGNOSIS — L816 Other disorders of diminished melanin formation: Secondary | ICD-10-CM

## 2023-07-06 DIAGNOSIS — L658 Other specified nonscarring hair loss: Secondary | ICD-10-CM

## 2023-07-06 DIAGNOSIS — L501 Idiopathic urticaria: Secondary | ICD-10-CM

## 2023-07-06 MED ORDER — SAFETY SEAL MISCELLANEOUS MISC: 1.0000 | Freq: Every morning | 6 refills | Status: DC

## 2023-07-06 NOTE — Progress Notes (Signed)
   Follow-Up Visit   Subjective  Maria Johnson is a 43 y.o. female who presents for the following: Traction Alopecia  Patient present today for follow up visit. Patient was last evaluated on 03/07/23. At this visit patient was prescribed:  Medrock minoxidil 8%/finasteride - apply QAM. Recommended Viviscal & Vital Protein   Patient reports sxs are better. Patient denies medication changes.  The following portions of the chart were reviewed this encounter and updated as appropriate: medications, allergies, medical history  Review of Systems:  No other skin or systemic complaints except as noted in HPI or Assessment and Plan.  Objective  Well appearing patient in no apparent distress; mood and affect are within normal limits.   A focused examination was performed of the following areas: scalp   Relevant exam findings are noted in the Assessment and Plan.           Assessment & Plan   1. Traction Alopecia - Assessment:  Patient demonstrates significant improvement with good regrowth on the frontal hairline following treatment with a compound containing minoxidil and clobetasol . One area on the right side near the ear shows less improvement, correlating with patient's admission of not applying the medication to that region. Patient is currently wearing her hair natural and unbraided, which is beneficial for hair follicle recovery.  - Plan:    Continue applying compound (minoxidil and clobetasol ) every morning    Extend application site to include the right side near the ear    Continue Vyvoskal tablets    Switch from collagen vital proteins tablets to collagen powder inside capsules due to taste issues    Follow up in 6 months    Return sooner if any concerns arise  2. Idiopathic Guttate Hypomelanosis - Assessment:  Patient reports newly noticed white speckles on arms and legs. Physical examination reveals hypopigmented macules consistent with idiopathic guttate  hypomelanosis. This benign condition is typically associated with mature skin and a history of significant sun exposure.  - Plan:    Advise regular use of sunscreen to prevent worsening or increase in number of hypopigmented macules   Return in about 6 months (around 01/05/2024) for traction alopecia.   Documentation: I have reviewed the above documentation for accuracy and completeness, and I agree with the above.   I, Shirron Louanne Roussel, CMA, am acting as scribe for [employer], DO.   Louana Roup, DO

## 2023-07-06 NOTE — Patient Instructions (Addendum)
 Date: Thu Jul 06 2023  Hello [place],  Thank you for visiting today. Here is a summary of the key instructions:  - Medications:   - Continue applying the compound from [facility] with minoxidil and clobetasol  every morning   - Extend the application area to include the right side near the ear   - Continue taking Viviscal  tablets   - Take collagen vital proteins powder inside capsules instead of the tablets  - Skin Care:   - Wear sunscreen regularly to prevent worsening of hypopigmented macules on arms and legs  - Follow-up:   - Return for follow-up appointment in 6 months   - Come in sooner if anything bothers you  We look forward to seeing you at your next visit. If you have any questions or concerns before then, please do not hesitate to contact our office.  Warm regards,  Dr. Louana Roup Dermatology     Important Information  Due to recent changes in healthcare laws, you may see results of your pathology and/or laboratory studies on MyChart before the doctors have had a chance to review them. We understand that in some cases there may be results that are confusing or concerning to you. Please understand that not all results are received at the same time and often the doctors may need to interpret multiple results in order to provide you with the best plan of care or course of treatment. Therefore, we ask that you please give us  2 business days to thoroughly review all your results before contacting the office for clarification. Should we see a critical lab result, you will be contacted sooner.   If You Need Anything After Your Visit  If you have any questions or concerns for your doctor, please call our main line at [phone] If no one answers, please leave a voicemail as directed and we will return your call as soon as possible. Messages left after 4 pm will be answered the following business day.   You may also send us  a message via MyChart. We typically respond to  MyChart messages within 1-2 business days.  For prescription refills, please ask your pharmacy to contact our office. Our fax number is [phone].  If you have an urgent issue when the clinic is closed that cannot wait until the next business day, you can page your doctor at the number below.    Please note that while we do our best to be available for urgent issues outside of office hours, we are not available 24/7.   If you have an urgent issue and are unable to reach us , you may choose to seek medical care at your doctor's office, retail clinic, urgent care center, or emergency room.  If you have a medical emergency, please immediately call 911 or go to the emergency department. In the event of inclement weather, please call our main line at [phone] for an update on the status of any delays or closures.  Dermatology Medication Tips: Please keep the boxes that topical medications come in in order to help keep track of the instructions about where and how to use these. Pharmacies typically print the medication instructions only on the boxes and not directly on the medication tubes.   If your medication is too expensive, please contact our office at [phone] or send us  a message through MyChart.   We are unable to tell what your co-pay for medications will be in advance as this is different depending on your insurance coverage.  However, we may be able to find a substitute medication at lower cost or fill out paperwork to get insurance to cover a needed medication.   If a prior authorization is required to get your medication covered by your insurance company, please allow us  1-2 business days to complete this process.  Drug prices often vary depending on where the prescription is filled and some pharmacies may offer cheaper prices.  The website www.goodrx.com contains coupons for medications through different pharmacies. The prices here do not account for what the cost may be with  help from insurance (it may be cheaper with your insurance), but the website can give you the price if you did not use any insurance.  - You can print the associated coupon and take it with your prescription to the pharmacy.  - You may also stop by our office during regular business hours and pick up a GoodRx coupon card.  - If you need your prescription sent electronically to a different pharmacy, notify our office through [facility] or by phone at [phone]

## 2023-07-07 ENCOUNTER — Encounter: Payer: Self-pay | Admitting: Family Medicine

## 2023-07-07 ENCOUNTER — Ambulatory Visit (INDEPENDENT_AMBULATORY_CARE_PROVIDER_SITE_OTHER): Payer: Self-pay

## 2023-07-07 ENCOUNTER — Ambulatory Visit (INDEPENDENT_AMBULATORY_CARE_PROVIDER_SITE_OTHER): Payer: Managed Care, Other (non HMO) | Admitting: Family Medicine

## 2023-07-07 ENCOUNTER — Ambulatory Visit: Payer: Self-pay | Admitting: Family Medicine

## 2023-07-07 VITALS — BP 122/78 | HR 89 | Temp 98.0°F | Resp 16 | Ht 68.0 in | Wt 198.0 lb

## 2023-07-07 DIAGNOSIS — Z113 Encounter for screening for infections with a predominantly sexual mode of transmission: Secondary | ICD-10-CM

## 2023-07-07 DIAGNOSIS — G43109 Migraine with aura, not intractable, without status migrainosus: Secondary | ICD-10-CM

## 2023-07-07 DIAGNOSIS — Z0000 Encounter for general adult medical examination without abnormal findings: Secondary | ICD-10-CM

## 2023-07-07 DIAGNOSIS — J309 Allergic rhinitis, unspecified: Secondary | ICD-10-CM | POA: Diagnosis not present

## 2023-07-07 DIAGNOSIS — L918 Other hypertrophic disorders of the skin: Secondary | ICD-10-CM

## 2023-07-07 DIAGNOSIS — E559 Vitamin D deficiency, unspecified: Secondary | ICD-10-CM | POA: Diagnosis not present

## 2023-07-07 DIAGNOSIS — Z114 Encounter for screening for human immunodeficiency virus [HIV]: Secondary | ICD-10-CM | POA: Diagnosis not present

## 2023-07-07 DIAGNOSIS — Z7985 Long-term (current) use of injectable non-insulin antidiabetic drugs: Secondary | ICD-10-CM

## 2023-07-07 DIAGNOSIS — T7840XA Allergy, unspecified, initial encounter: Secondary | ICD-10-CM

## 2023-07-07 DIAGNOSIS — E1165 Type 2 diabetes mellitus with hyperglycemia: Secondary | ICD-10-CM | POA: Diagnosis not present

## 2023-07-07 LAB — LIPID PANEL
Cholesterol: 127 mg/dL (ref 0–200)
HDL: 42.2 mg/dL (ref >39.00)
LDL Cholesterol: 73 mg/dL (ref 0–99)
NonHDL: 84.59
Total CHOL/HDL Ratio: 3
Triglycerides: 57 mg/dL (ref 0.0–149.0)
VLDL: 11.4 mg/dL (ref 0.0–40.0)

## 2023-07-07 LAB — CBC
HCT: 38.2 % (ref 36.0–46.0)
Hemoglobin: 12.3 g/dL (ref 12.0–15.0)
MCHC: 32.2 g/dL (ref 30.0–36.0)
MCV: 81.9 fl (ref 78.0–100.0)
Platelets: 350 10*3/uL (ref 150.0–400.0)
RBC: 4.66 Mil/uL (ref 3.87–5.11)
RDW: 14.1 % (ref 11.5–15.5)
WBC: 4.7 10*3/uL (ref 4.0–10.5)

## 2023-07-07 LAB — COMPREHENSIVE METABOLIC PANEL WITH GFR
ALT: 16 U/L (ref 0–35)
AST: 18 U/L (ref 0–37)
Albumin: 4.4 g/dL (ref 3.5–5.2)
Alkaline Phosphatase: 64 U/L (ref 39–117)
BUN: 9 mg/dL (ref 6–23)
CO2: 27 meq/L (ref 19–32)
Calcium: 9.2 mg/dL (ref 8.4–10.5)
Chloride: 103 meq/L (ref 96–112)
Creatinine, Ser: 0.99 mg/dL (ref 0.40–1.20)
GFR: 70.01 mL/min (ref >60.00)
Glucose, Bld: 91 mg/dL (ref 70–99)
Potassium: 4.2 meq/L (ref 3.5–5.1)
Sodium: 137 meq/L (ref 135–145)
Total Bilirubin: 0.4 mg/dL (ref 0.2–1.2)
Total Protein: 6.7 g/dL (ref 6.0–8.3)

## 2023-07-07 LAB — HEMOGLOBIN A1C: Hgb A1c MFr Bld: 6 % (ref 4.6–6.5)

## 2023-07-07 LAB — MICROALBUMIN / CREATININE URINE RATIO
Creatinine,U: 90.7 mg/dL
Microalb Creat Ratio: 16.4 mg/g (ref 0.0–30.0)
Microalb, Ur: 1.5 mg/dL (ref 0.0–1.9)

## 2023-07-07 LAB — VITAMIN D 25 HYDROXY (VIT D DEFICIENCY, FRACTURES): VITD: 44.47 ng/mL (ref 30.00–100.00)

## 2023-07-07 MED ORDER — FAMOTIDINE 40 MG PO TABS: 40.0000 mg | ORAL_TABLET | Freq: Every day | ORAL | 1 refills | Status: DC

## 2023-07-07 MED ORDER — SUMATRIPTAN SUCCINATE 100 MG PO TABS: ORAL_TABLET | ORAL | 5 refills | Status: DC

## 2023-07-07 NOTE — Progress Notes (Signed)
 Chief Complaint  Patient presents with   Annual Exam    CPE     Well Woman Maria Johnson is here for a complete physical.   Her last physical was >1 year ago.  Current diet: in general, a "healthy" diet. Current exercise: walking. Weight is intentionally decreasing and she denies fatigue out of ordinary. Patient's last menstrual period was 08/09/2007. Seatbelt? Yes Advanced directive? No  Health Maintenance Pap/HPV- Yes Mammogram- Yes Tetanus- Yes Hep C screening- Yes HIV screening- Yes  Pt has a hx of skin tags on her neck that snag on clothing and jewelry. They are bothersome to her and she would like them removed. They are not growing, draining, itching or currently hurting. No spreading redness or fevers.   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   Pes planus 10/22/2015   Pneumonia    Sickle cell trait (HCC)    Sleep apnea    has cpap   SOBOE (shortness of breath on exertion)    Sprain of medial collateral ligament of left knee 10/30/2018   Type 2 diabetes mellitus with hyperglycemia (HCC) 08/12/2013   Vitamin D  deficiency 08/24/2017   Well adult exam 09/25/2017     Past Surgical History:  Procedure Laterality Date   ANKLE SURGERY  05/2003   torn cartilage  repair; left   ANTERIOR LUMBAR FUSION  01/06/2011   Procedure: ANTERIOR LUMBAR FUSION 1 LEVEL;  Surgeon: Lei Pump, MD;  Location: [facility];  Service:  Neurosurgery;  Laterality: N/A;  Lumbar five-Sacral one  Anterior Lumbar Interbody Fusion with Instrumentation    BACK SURGERY     Laminectomy Sept 14, 2011,   DIAGNOSTIC LAPAROSCOPY  01/2002   removed scar tissue   LAMINECTOMY AND MICRODISCECTOMY LUMBAR SPINE  10/2009   L4, L5, S1   LAPAROSCOPIC GASTRIC SLEEVE RESECTION N/A 12/08/2020   Procedure: LAPAROSCOPIC GASTRIC SLEEVE RESECTION;  Surgeon: Adalberto Acton, MD;  Location: [facility];  Service: General;  Laterality: N/A;   LUMBAR FUSION  01/06/2011   L5-S1   TOE SURGERY  07/05/2020   TONSILLECTOMY  03/2005   UPPER GI ENDOSCOPY N/A 12/08/2020   Procedure: UPPER GI ENDOSCOPY;  Surgeon: Adalberto Acton, MD;  Location: [facility];  Service: General;  Laterality: N/A;   VAGINAL HYSTERECTOMY  08/2007   partial; AUB    Medications  Current Outpatient Medications on File Prior to Visit  Medication Sig Dispense Refill   acetaminophen  (TYLENOL ) 500 MG tablet Take 500 mg by mouth every 6 (six) hours as needed.     ALPRAZolam  (XANAX ) 0.5 MG tablet Take 1 tablet (0.5 mg total) by mouth daily as needed for anxiety. 60 tablet 2   atorvastatin  (LIPITOR) 40 MG tablet TAKE 1 TABLET(40 MG) BY MOUTH DAILY 90 tablet 3   citalopram  (  CELEXA ) 10 MG tablet Take 1 tablet (10 mg total) by mouth daily. 90 tablet 3   clobetasol  (TEMOVATE ) 0.05 % external solution APPLY TO AFFECTED AREA OF SCALP TWICE DAILY X 2 WEEKS. TAKE 1-2 WEEKS OFF AND REPEAT AS NEEDED     EPINEPHrine  0.3 mg/0.3 mL IJ SOAJ injection Inject 0.3 mg into the muscle as needed for anaphylaxis. 2 each 3   famotidine  (PEPCID ) 40 MG tablet Take 1 tablet (40 mg total) by mouth daily. 90 tablet 1   fluconazole  (DIFLUCAN ) 200 MG tablet Take 200 mg by mouth as needed.     fluticasone  (FLONASE ) 50 MCG/ACT nasal spray Place 2 sprays into both nostrils daily. 16 g 5   hydrocortisone  2.5 % cream Apply topically 2 (two) times daily. 30 g 5   Multiple Vitamins-Minerals (MULTIVITAMIN WITH MINERALS) tablet Take 1  tablet by mouth daily.     Safety Seal Miscellaneous MISC Apply 1 application  topically every morning. Medication Name: Hormonic Hair 30 mL 6   SUMAtriptan  (IMITREX ) 100 MG tablet Take 1 tab daily as needed for migraines. May repeat in 2 hours if headache persists or recurs. 10 tablet 2   tirzepatide  (MOUNJARO ) 15 MG/0.5ML Pen Inject 15 mg into the skin once a week. 6 mL 2    Allergies Allergies  Allergen Reactions   Hydrocodone  Swelling   Molds & Smuts Other (See Comments)    Sneezing, runny eyes and nose    Review of Systems: Constitutional:  no unexpected weight changes Eye:  no recent significant change in vision Ear/Nose/Mouth/Throat:  Ears:  no recent change in hearing Nose/Mouth/Throat:  no complaints of nasal congestion, no sore throat Cardiovascular: no chest pain Respiratory:  no shortness of breath Gastrointestinal:  no abdominal pain, no change in bowel habits GU:  Female: negative for dysuria or pelvic pain Musculoskeletal/Extremities:  no new pain of the joints Integumentary (Skin/Breast):  +skin tags on R side of neck Neurologic:  no headaches Endocrine:  denies fatigue Hematologic/Lymphatic:  No areas of easy bleeding  Exam BP 122/78 (BP Location: Left Arm, Patient Position: Sitting)   Pulse 89   Temp 98 F (36.7 C) (Oral)   Resp 16   Ht 5\' 8"  (1.727 m)   Wt 198 lb (89.8 kg)   LMP 08/09/2007   SpO2 98%   BMI 30.11 kg/m  General:  well developed, well nourished, in no apparent distress Skin: 5 skin tags noted on R lateral portion of the neck; otherwise no significant moles, warts, or growths Head:  no masses, lesions, or tenderness Eyes:  pupils equal and round, sclera anicteric without injection Ears:  canals without lesions, TMs shiny without retraction, no obvious effusion, no erythema Nose:  nares patent, mucosa normal, and no drainage Throat/Pharynx:  lips and gingiva without lesion; tongue and uvula midline; non-inflamed pharynx; no exudates or  postnasal drainage Neck: neck supple without adenopathy, thyromegaly, or masses Lungs:  clear to auscultation, breath sounds equal bilaterally, no respiratory distress Cardio:  regular rate and rhythm, no LE edema Abdomen:  abdomen soft, nontender; bowel sounds normal; no masses or organomegaly Genital: Defer to GYN Musculoskeletal:  symmetrical muscle groups noted without atrophy or deformity Extremities:  no clubbing, cyanosis, or edema, no deformities, no skin discoloration Neuro:  gait normal; deep tendon reflexes normal and symmetric Psych: well oriented with normal range of affect and appropriate judgment/insight  Procedure note; skin tag removal Informed consent obtained. Skin tags were anesthetized with 1% lidocaine  with epinephrine  after being cleaned  with alcohol. 5 lesions were grasped with tweezers and shaved off with a blade.  Hemostasis was obtained via cauterization.  TAO and a bandaid placed x5. . There were no complications noted. The patient tolerated the procedure well.  Assessment and Plan  Well adult exam - Plan: Lipid panel, CBC, Comprehensive metabolic panel with GFR  Type 2 diabetes mellitus with hyperglycemia, without long-term current use of insulin  (HCC) - Plan: Microalbumin / creatinine urine ratio, Hemoglobin A1c  Vitamin D  deficiency - Plan: VITAMIN D  25 Hydroxy (Vit-D Deficiency, Fractures)  Screening for HIV without presence of risk factors - Plan: HIV Antibody (routine testing w rflx)  Screening examination for STI - Plan: Cervicovaginal ancillary only( [place])  Migraine with aura and without status migrainosus, not intractable - Plan: SUMAtriptan  (IMITREX ) 100 MG tablet  Allergic reaction, initial encounter - Plan: famotidine  (PEPCID ) 40 MG tablet  Acrochordon - Plan: PR DESTRUCTION BENIGN LESIONS UP TO 14   Well 43 y.o. female. Counseled on diet and exercise. Advanced directive form provided today.  Other orders as above. Skin tags:  Chronic issue. Removed 5 skin tags today. Discussed there may be oop cost but she noted she has met her out of pocket cost for the year.  Follow up in 6 mo. The patient voiced understanding and agreement to the plan.  Shellie Dials Ronald, DO 07/07/23 7:59 AM

## 2023-07-07 NOTE — Patient Instructions (Addendum)
 Give us  2-3 business days to get the results of your labs back.   Keep the diet clean and stay active.  Please get me a copy of your advanced directive form at your convenience.   Do not shower for the rest of the day. When you do wash it, use only soap and water. Do not vigorously scrub. Apply triple antibiotic ointment (like Neosporin) twice daily. Keep the area clean and dry.   Things to look out for: increasing pain not relieved by ibuprofen/acetaminophen , fevers, spreading redness, drainage of pus, or foul odor.  Let us  know if you need anything.

## 2023-07-08 LAB — HIV ANTIBODY (ROUTINE TESTING W REFLEX): HIV 1&2 Ab, 4th Generation: NONREACTIVE

## 2023-07-10 LAB — HM DIABETES EYE EXAM

## 2023-07-11 LAB — GENECONNECT MOLECULAR SCREEN: Genetic Analysis Overall Interpretation: NEGATIVE

## 2023-07-13 ENCOUNTER — Encounter (HOSPITAL_COMMUNITY): Payer: Self-pay | Admitting: *Deleted

## 2023-07-26 ENCOUNTER — Ambulatory Visit (INDEPENDENT_AMBULATORY_CARE_PROVIDER_SITE_OTHER): Payer: Self-pay

## 2023-07-26 DIAGNOSIS — J309 Allergic rhinitis, unspecified: Secondary | ICD-10-CM | POA: Diagnosis not present

## 2023-08-02 ENCOUNTER — Encounter: Payer: Self-pay | Admitting: Plastic Surgery

## 2023-08-02 ENCOUNTER — Ambulatory Visit: Admitting: Plastic Surgery

## 2023-08-02 VITALS — BP 126/81 | HR 84 | Ht 69.0 in | Wt 190.6 lb

## 2023-08-02 DIAGNOSIS — Z9884 Bariatric surgery status: Secondary | ICD-10-CM

## 2023-08-02 DIAGNOSIS — M793 Panniculitis, unspecified: Secondary | ICD-10-CM | POA: Diagnosis not present

## 2023-08-02 DIAGNOSIS — E65 Localized adiposity: Secondary | ICD-10-CM | POA: Diagnosis not present

## 2023-08-02 NOTE — Progress Notes (Signed)
 Referring Provider Frann Mabel Mt, DO [address] [place],  [place] [address]   CC:  Chief Complaint  Patient presents with   Advice Only      Maria Johnson is an 43 y.o. female.  HPI: Ms. Broadfoot is a 43 year old female who underwent a gastric bypass in 2022.  Since that time she has lost just over 60 pounds.  Her weight is currently stable.  She notes that she has had an increase in the tissue hanging on the anterior portion of her abdomen.  The skin frequently becomes inflamed and has rashes especially when she is sweating.  It will occasionally make wearing close more difficult but otherwise does not normally interfere with daily activities.  She is interested in having it removed.  Allergies  Allergen Reactions   Hydrocodone  Swelling   Molds & Smuts Other (See Comments)    Sneezing, runny eyes and nose    Outpatient Encounter Medications as of 08/02/2023  Medication Sig   acetaminophen  (TYLENOL ) 500 MG tablet Take 500 mg by mouth every 6 (six) hours as needed.   ALPRAZolam  (XANAX ) 0.5 MG tablet Take 1 tablet (0.5 mg total) by mouth daily as needed for anxiety.   atorvastatin  (LIPITOR) 40 MG tablet TAKE 1 TABLET(40 MG) BY MOUTH DAILY   citalopram  (CELEXA ) 10 MG tablet Take 1 tablet (10 mg total) by mouth daily.   clobetasol  (TEMOVATE ) 0.05 % external solution APPLY TO AFFECTED AREA OF SCALP TWICE DAILY X 2 WEEKS. TAKE 1-2 WEEKS OFF AND REPEAT AS NEEDED   EPINEPHrine  0.3 mg/0.3 mL IJ SOAJ injection Inject 0.3 mg into the muscle as needed for anaphylaxis.   famotidine  (PEPCID ) 40 MG tablet Take 1 tablet (40 mg total) by mouth daily.   fluconazole  (DIFLUCAN ) 200 MG tablet Take 200 mg by mouth as needed.   fluticasone  (FLONASE ) 50 MCG/ACT nasal spray Place 2 sprays into both nostrils daily.   hydrocortisone  2.5 % cream Apply topically 2 (two) times daily.   Multiple Vitamins-Minerals (MULTIVITAMIN WITH MINERALS) tablet Take 1 tablet by mouth daily.    Safety Seal Miscellaneous MISC Apply 1 application  topically every morning. Medication Name: Hormonic Hair   SUMAtriptan  (IMITREX ) 100 MG tablet Take 1 tab daily as needed for migraines. May repeat in 2 hours if headache persists or recurs.   tirzepatide  (MOUNJARO ) 15 MG/0.5ML Pen Inject 15 mg into the skin once a week.   No facility-administered encounter medications on file as of 08/02/2023.     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   Pes planus 10/22/2015   Pneumonia    Sickle cell trait (HCC)    Sleep apnea    has cpap   SOBOE (shortness of breath on exertion)    Sprain of medial collateral ligament of  left knee 10/30/2018   Type 2 diabetes mellitus with hyperglycemia (HCC) 08/12/2013   Vitamin D  deficiency 08/24/2017   Well adult exam 09/25/2017    Past Surgical History:  Procedure Laterality Date   ANKLE SURGERY  05/2003   torn cartilage  repair; left   ANTERIOR LUMBAR FUSION  01/06/2011   Procedure: ANTERIOR LUMBAR FUSION 1 LEVEL;  Surgeon: Fairy JONETTA Levels, MD;  Location: [facility];  Service: Neurosurgery;  Laterality: N/A;  Lumbar five-Sacral one  Anterior Lumbar Interbody Fusion with Instrumentation    BACK SURGERY     Laminectomy Sept 14, 2011,   DIAGNOSTIC LAPAROSCOPY  01/2002   removed scar tissue   LAMINECTOMY AND MICRODISCECTOMY LUMBAR SPINE  10/2009   L4, L5, S1   LAPAROSCOPIC GASTRIC SLEEVE RESECTION N/A 12/08/2020    Procedure: LAPAROSCOPIC GASTRIC SLEEVE RESECTION;  Surgeon: Signe Mitzie LABOR, MD;  Location: [facility];  Service: General;  Laterality: N/A;   LUMBAR FUSION  01/06/2011   L5-S1   TOE SURGERY  07/05/2020   TONSILLECTOMY  03/2005   UPPER GI ENDOSCOPY N/A 12/08/2020   Procedure: UPPER GI ENDOSCOPY;  Surgeon: Signe Mitzie LABOR, MD;  Location: [facility];  Service: General;  Laterality: N/A;   VAGINAL HYSTERECTOMY  08/2007   partial; AUB    Family History  Problem Relation Age of Onset   Miscarriages / Stillbirths Mother    Hypertension Mother    Alcohol abuse Father    Depression Father    Drug abuse Father    Hyperlipidemia Father    Stroke Father    Anxiety disorder Father    Sleep apnea Father    Alcoholism Father    Depression Daughter    Diabetes Daughter    Hypertension Daughter    Miscarriages / [place] Daughter    Birth defects Son    Diabetes Maternal Grandmother    Heart attack Maternal Grandmother    Hypertension Maternal Grandmother    Stroke Maternal Grandmother    Diabetes Maternal Grandfather    Hypertension Maternal Grandfather    Diabetes Paternal Grandmother    Lupus Paternal Grandmother     Social History   Social History Narrative   Not on file     Review of Systems General: Denies fevers, chills, weight loss CV: Denies chest pain, shortness of breath, palpitations Abdomen: Excess skin and fat on the anterior abdominal wall especially in the lower abdomen  Physical Exam    08/02/2023    2:33 PM 07/07/2023    7:13 AM 05/09/2023   10:43 AM  Vitals with BMI  Height 5' 9 5' 8 5' 8  Weight 190 lbs 10 oz 198 lbs 204 lbs 6 oz  BMI 28.13 30.11 31.09  Systolic 126 122 871  Diastolic 81 78 80  Pulse 84 89 80    General:  No acute distress,  Alert and oriented, Non-Toxic, Normal speech and affect Abdomen: Patient has excess skin and fat on the anterior abdominal wall.  The pannus hangs just below the symphysis pubis.  She does have some discoloration  of the skin which may be consistent with previous rashes.  This is documented in her photographs.  There is no hernias palpable on physical examination. Mammogram: April 2025 BI-RADS 1 Assessment/Plan Pannus: Patient has a modest pannus and would likely benefit from panniculectomy.  We discussed the procedure at length.  I showed her the location of the incisions and we discussed the unpredictable nature of scarring and wound healing.  We  discussed the risk of bleeding, infection, seroma formation.  The risk of infection and wound healing issues is slightly higher due to nature of the rashes and infection on the posterior pannus.  She understands that she will have drains postoperatively and will need to wear compression for 6 weeks.  Limitations of activity postoperatively include no heavy lifting, no vigorous activity, and no submerging incisions in water for 6 weeks.  She will be encouraged to begin ambulation immediately after surgery to help decrease the risk of DVT.  She may return to light activity as tolerated.  All questions were answered to her satisfaction.  Photographs were obtained today with her consent.  Will submit her for panniculectomy at her request.  Leonce KATHEE [place] 08/02/2023, 3:02 PM

## 2023-08-11 ENCOUNTER — Encounter: Payer: Self-pay | Admitting: Plastic Surgery

## 2023-08-24 ENCOUNTER — Ambulatory Visit (INDEPENDENT_AMBULATORY_CARE_PROVIDER_SITE_OTHER): Payer: Self-pay

## 2023-08-24 DIAGNOSIS — J309 Allergic rhinitis, unspecified: Secondary | ICD-10-CM

## 2023-09-03 ENCOUNTER — Encounter: Payer: Self-pay | Admitting: Family Medicine

## 2023-09-08 ENCOUNTER — Encounter: Payer: Self-pay | Admitting: Family Medicine

## 2023-09-08 ENCOUNTER — Other Ambulatory Visit (HOSPITAL_BASED_OUTPATIENT_CLINIC_OR_DEPARTMENT_OTHER): Payer: Self-pay

## 2023-09-08 ENCOUNTER — Ambulatory Visit (INDEPENDENT_AMBULATORY_CARE_PROVIDER_SITE_OTHER): Admitting: Family Medicine

## 2023-09-08 VITALS — BP 126/80 | HR 80 | Temp 98.0°F | Resp 16 | Ht 69.0 in | Wt 192.6 lb

## 2023-09-08 DIAGNOSIS — M79644 Pain in right finger(s): Secondary | ICD-10-CM

## 2023-09-08 DIAGNOSIS — G43109 Migraine with aura, not intractable, without status migrainosus: Secondary | ICD-10-CM | POA: Diagnosis not present

## 2023-09-08 MED ORDER — RIZATRIPTAN BENZOATE 10 MG PO TABS
10.0000 mg | ORAL_TABLET | ORAL | 0 refills | Status: DC | PRN
Start: 2023-09-08 — End: 2023-09-22
  Filled 2023-09-08: qty 10, 10d supply, fill #0

## 2023-09-08 NOTE — Patient Instructions (Signed)
 Take the Maxalt  when you get faint/dizzy.   Let me know if you wish to see a hand specialist for the thumb.  Let us  know if you need anything.

## 2023-09-08 NOTE — Progress Notes (Signed)
 Chief Complaint  Patient presents with   Laceration    Thumb Sore/ Swollen    Maria Johnson is a 43 y.o. female here for a skin complaint.  Duration: 6weeks Location: thumb; had cut it with a cheese grater Pruritic? No Painful? Yes- with any contact against it Drainage? No Other associated symptoms: not growing, no fevers Therapies tried thus far: TAO  Migraines Imitrex  no longer working. Has never tried anything else. Unsure of recent trigger.  Does not currently have a headache.  She is not on a prophylactic medication.  No neurologic signs or symptoms.  Her aura is lightheadedness/feeling faint.  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   Pes planus 10/22/2015   Pneumonia    Sickle cell trait (HCC)    Sleep apnea    has cpap   SOBOE (shortness of breath on exertion)    Sprain of medial collateral ligament of left knee 10/30/2018   Type 2 diabetes mellitus with hyperglycemia (HCC) 08/12/2013   Vitamin D  deficiency 08/24/2017   Well adult exam 09/25/2017    BP 126/80 (BP Location: Left Arm, Patient Position: Sitting)   Pulse 80   Temp 98 F (36.7 C) (Oral)   Resp 16   Ht 5' 9 (1.753 m)   Wt 192 lb 9.6 oz (87.4 kg)   LMP 08/09/2007   SpO2 98%   BMI 28.44 kg/m  Gen: awake, alert, appearing stated age Lungs: No accessory muscle use Skin: Small nodule over the lateral dorsum of the right thumb at the IP joint.  Slight  TTP, freely movable. No drainage, erythema, fluctuance, excoriation Neuro: DTRs equal and symmetric throughout, no clonus, no cerebellar signs, 5/5 strength throughout, gait is normal MSK: No TTP over the temporalis, TMJ, suboccipital triangle, cervical paraspinal musculature, cervical midline, slight TTP over the trapezius musculature bilaterally Psych: Age appropriate judgment and insight  Thumb pain, right  Migraine with aura and without status migrainosus, not intractable  Seems to be scar tissue versus a small cyst.  Not terribly bothersome.  Does not affect her range of motion or ADLs.  Offered referral to hand which she politely declined at this time.  She will let me know if she changes her mind. Chronic, not controlled.  Stop Imitrex , start Maxalt  10 mg daily as needed.  She is to take at the onset of her aura.  Not frequent enough to consider a prophylactic just yet.  Follow-up in 1 month to recheck.  Would consider Nurtec versus Ubrelvy if she fails this.  Would also consider a prophylactic. The patient voiced understanding and agreement to the plan.  Mabel Mt [place], DO 09/08/23 4:36 PM

## 2023-09-11 ENCOUNTER — Encounter: Payer: Self-pay | Admitting: Family Medicine

## 2023-09-12 ENCOUNTER — Other Ambulatory Visit: Payer: Self-pay

## 2023-09-12 DIAGNOSIS — R42 Dizziness and giddiness: Secondary | ICD-10-CM

## 2023-09-22 ENCOUNTER — Other Ambulatory Visit (HOSPITAL_BASED_OUTPATIENT_CLINIC_OR_DEPARTMENT_OTHER): Payer: Self-pay

## 2023-09-22 ENCOUNTER — Ambulatory Visit (INDEPENDENT_AMBULATORY_CARE_PROVIDER_SITE_OTHER): Admitting: Family Medicine

## 2023-09-22 ENCOUNTER — Encounter: Payer: Self-pay | Admitting: Family Medicine

## 2023-09-22 ENCOUNTER — Ambulatory Visit: Payer: Self-pay | Admitting: Family Medicine

## 2023-09-22 VITALS — BP 124/78 | HR 66 | Temp 98.0°F | Resp 16 | Ht 69.0 in | Wt 192.0 lb

## 2023-09-22 DIAGNOSIS — Z9884 Bariatric surgery status: Secondary | ICD-10-CM

## 2023-09-22 DIAGNOSIS — F411 Generalized anxiety disorder: Secondary | ICD-10-CM

## 2023-09-22 DIAGNOSIS — G43909 Migraine, unspecified, not intractable, without status migrainosus: Secondary | ICD-10-CM

## 2023-09-22 DIAGNOSIS — K0889 Other specified disorders of teeth and supporting structures: Secondary | ICD-10-CM

## 2023-09-22 LAB — B12 AND FOLATE PANEL
Folate: >23.4 ng/mL (ref >5.9)
Vitamin B-12: 1118 pg/mL — ABNORMAL HIGH (ref 211–911)

## 2023-09-22 MED ORDER — AMOXICILLIN 875 MG PO TABS
875.0000 mg | ORAL_TABLET | Freq: Two times a day (BID) | ORAL | 0 refills | Status: AC
  Filled 2023-09-22: qty 10, 5d supply, fill #0

## 2023-09-22 MED ORDER — KETOROLAC TROMETHAMINE 10 MG PO TABS
10.0000 mg | ORAL_TABLET | Freq: Four times a day (QID) | ORAL | 0 refills | Status: DC | PRN
  Filled 2023-09-22: qty 20, 5d supply, fill #0

## 2023-09-22 MED ORDER — NURTEC 75 MG PO TBDP
75.0000 mg | ORAL_TABLET | Freq: Every day | ORAL | 2 refills | Status: DC | PRN
  Filled 2023-09-22: qty 16, 30d supply, fill #0
  Filled 2023-10-18 – 2023-11-05 (×3): qty 16, 30d supply, fill #1
  Filled 2023-12-02: qty 16, 30d supply, fill #2

## 2023-09-22 MED ORDER — CITALOPRAM HYDROBROMIDE 20 MG PO TABS
20.0000 mg | ORAL_TABLET | Freq: Every day | ORAL | 3 refills | Status: DC
  Filled 2023-09-22: qty 30, 30d supply, fill #0
  Filled 2023-10-17: qty 30, 30d supply, fill #1
  Filled 2023-11-16: qty 30, 30d supply, fill #2
  Filled 2023-12-16: qty 30, 30d supply, fill #3

## 2023-09-22 MED ORDER — KETOROLAC TROMETHAMINE 60 MG/2ML IM SOLN
60.0000 mg | Freq: Once | INTRAMUSCULAR | Status: AC
  Administered 2023-09-22: 60 mg via INTRAMUSCULAR

## 2023-09-22 NOTE — Progress Notes (Signed)
 Chief Complaint  Patient presents with   Migraine    Subjective: Patient is a 43 y.o. female here for a migraine.  Started last night.  She has been having irritability, poor sleep, pain on the left side of her head and sometimes in the back of her head, and teeth grinding.  She is not on a prophylactic.  She takes Celexa  10 mg daily for anxiety.  It seems to be working well until of late.  She is seeing a therapist.  No homicidal or suicidal ideation.  No self-medication.  She has failed Imitrex  and is currently taking Maxalt .  This also does not work.  Toradol  seems to be helpful.  Patient has a history of dental infections.  Her dentist is on vacation for a few weeks.  She is requesting an anti-inflammatory and amoxicillin  as this has historically worked well for her.  No drainage from her left upper molar region where the pain is.  Denies fevers.  Bariatric surgery tea requesting labs. Will draw today.   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   Pes planus 10/22/2015   Pneumonia    Sickle cell trait (HCC)    Sleep apnea    has cpap   SOBOE (shortness of breath on exertion)    Sprain of medial collateral ligament of left knee 10/30/2018   Type 2 diabetes mellitus with hyperglycemia (HCC) 08/12/2013   Vitamin D  deficiency 08/24/2017   Well adult exam 09/25/2017    Objective: BP 124/78 (BP Location: Left Arm, Patient Position: Sitting)    Pulse 66   Temp 98 F (36.7 C) (Oral)   Resp 16   Ht 5' 9 (1.753 m)   Wt 192 lb (87.1 kg)   LMP 08/09/2007   SpO2 97%   BMI 28.35 kg/m  General: Awake, appears stated age MSK: Hypertonicity and mild TTP over the suboccipital triangle, cervical paraspinal musculature, and trapezius musculature bilaterally; no TTP over the TMJs bilaterally Neuro: DTRs equal and symmetric throughout, no clonus, no cerebellar signs, 5/5 strength throughout Mouth: MMM, mild gingival edema over the L upper molar region Lungs: No accessory muscle use Psych: Age appropriate judgment and insight, normal affect and mood  Assessment and Plan: Migraine without status migrainosus, not intractable, unspecified migraine type - Plan: Rimegepant Sulfate  (NURTEC) 75 MG TBDP, ketorolac  (TORADOL ) injection 60 mg  Generalized anxiety disorder  History of bariatric surgery - Plan: Iron, TIBC and Ferritin Panel, B12 and Folate Panel, Vitamin B1  Dentalgia - Plan: amoxicillin  (AMOXIL ) 875 MG tablet, ketorolac  (TORADOL ) 10 MG tablet  Chronic, not controlled.  Toradol  injection today.  Nurtec to replace Maxalt  as she has failed both Maxalt  and Imitrex  now.  She has an appointment with the neurology team in 3 months.  I will see her in 1 month to  see how things are doing. Chronic, not controlled.  Could be because of #1.  We will increase Celexa  from 10 mg daily to 20 mg daily.  Continue with the therapy team.  Follow-up in 1 month to see how things are going. Orders as above. Amoxicillin  and Toradol  today, will start the tablet tomorrow on an as needed basis. The patient voiced understanding and agreement to the plan.  Mabel Mt [place], DO 09/22/23  8:42 AM

## 2023-09-22 NOTE — Patient Instructions (Signed)
 Heat (pad or rice pillow in microwave) over affected area, 10-15 minutes twice daily.   Ice/cold pack over area for 10-15 min twice daily.  OK to take Tylenol  1000 mg (2 extra strength tabs) or 975 mg (3 regular strength tabs) every 6 hours as needed.  Let me know if there are cost issues.   Let us  know if you need anything.

## 2023-09-23 ENCOUNTER — Other Ambulatory Visit (HOSPITAL_BASED_OUTPATIENT_CLINIC_OR_DEPARTMENT_OTHER): Payer: Self-pay

## 2023-09-25 ENCOUNTER — Ambulatory Visit (INDEPENDENT_AMBULATORY_CARE_PROVIDER_SITE_OTHER): Payer: Self-pay

## 2023-09-25 ENCOUNTER — Other Ambulatory Visit (HOSPITAL_BASED_OUTPATIENT_CLINIC_OR_DEPARTMENT_OTHER): Payer: Self-pay

## 2023-09-25 ENCOUNTER — Encounter: Payer: Self-pay | Admitting: Family Medicine

## 2023-09-25 DIAGNOSIS — J309 Allergic rhinitis, unspecified: Secondary | ICD-10-CM | POA: Diagnosis not present

## 2023-09-26 ENCOUNTER — Encounter: Payer: Self-pay | Admitting: Family Medicine

## 2023-09-26 ENCOUNTER — Ambulatory Visit (INDEPENDENT_AMBULATORY_CARE_PROVIDER_SITE_OTHER): Admitting: Family Medicine

## 2023-09-26 ENCOUNTER — Other Ambulatory Visit (HOSPITAL_BASED_OUTPATIENT_CLINIC_OR_DEPARTMENT_OTHER): Payer: Self-pay

## 2023-09-26 VITALS — BP 120/78 | HR 99 | Temp 98.0°F | Resp 16 | Ht 69.0 in | Wt 192.0 lb

## 2023-09-26 DIAGNOSIS — R519 Headache, unspecified: Secondary | ICD-10-CM

## 2023-09-26 DIAGNOSIS — J3489 Other specified disorders of nose and nasal sinuses: Secondary | ICD-10-CM | POA: Diagnosis not present

## 2023-09-26 MED ORDER — METHYLPREDNISOLONE ACETATE 80 MG/ML IJ SUSP
80.0000 mg | Freq: Once | INTRAMUSCULAR | Status: AC
  Administered 2023-09-26: 80 mg via INTRAMUSCULAR

## 2023-09-26 NOTE — Telephone Encounter (Signed)
Pt scheduled today 1130 

## 2023-09-26 NOTE — Progress Notes (Signed)
 Chief Complaint  Patient presents with   Follow-up    FOLLOW UP    Subjective: Patient is a 43 y.o. female here for headache.  Patient reports having a couple headaches yesterday and the day before.  She had associated yellowish/clear and watery drainage from the left nostril.  She was not having a runny/stuffy nose prior to this.  It was associated with her headache.  Denies any trauma.  She does have a history of sleep apnea.  No known history of IIH.  Has an appointment with a headache specialist in a few months.  Only has a mild headache right now.  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   Pes planus 10/22/2015   Pneumonia    Sickle cell trait (HCC)    Sleep apnea    has cpap   SOBOE (shortness of breath on exertion)    Sprain of medial collateral ligament of left knee 10/30/2018   Type 2 diabetes mellitus with hyperglycemia (HCC) 08/12/2013   Vitamin D  deficiency 08/24/2017   Well adult exam 09/25/2017    Objective: BP 120/78 (BP Location: Left Arm, Patient Position: Sitting)   Pulse 99   Temp 98 F (36.7 C) (Oral)   Resp 16   Ht 5' 9 (1.753 m)   Wt 192 lb (87.1 kg)   LMP 08/09/2007   SpO2 98%   BMI 28.35 kg/m  General: Awake, appears stated age MSK: No TTP over the sinuses bilaterally, suboccipital triangle, cervical paraspinal musculature, trapezius musculature bilaterally Neuro: DTRs equal and symmetric throughout,  no clonus, no cerebellar signs, 5/5 strength throughout, gait is normal Nose: Nares are patent without rhinorrhea Mouth: MMM, no postnasal drainage noted Lungs: No accessory muscle use Psych: Age appropriate judgment and insight, normal affect and mood  Assessment and Plan: Nasal drainage - Plan: CT TEMPORAL BONES WO CONTRAST, Beta 2 transferrin  Acute nonintractable headache, unspecified headache type - Plan: CT TEMPORAL BONES WO CONTRAST, methylPREDNISolone  acetate (DEPO-MEDROL ) injection 80 mg  1/2.  New diagnosis with uncertain prognosis.  I do have a moderate concern for cerebrospinal fluid leak.  Will check a CT of the temporal bones without contrast and we did swab her nose and will check for beta-2 transferrin.  Will place an urgent referral to neurosurgery if either are positive.  Will consider Diamox treatment if there is a leak also.  Depo-Medrol  injection on the chance it is related to more allergies. The patient voiced understanding and agreement to the plan.  Mabel Mt [place], DO 09/26/23  11:50 AM

## 2023-09-26 NOTE — Patient Instructions (Signed)
 I expect/want the scan done in the next few business days.   If symptoms completely resolve after the shot, I am not convinced we will still need the scan.   Let us  know if you need anything.

## 2023-09-27 ENCOUNTER — Other Ambulatory Visit (HOSPITAL_BASED_OUTPATIENT_CLINIC_OR_DEPARTMENT_OTHER): Payer: Self-pay

## 2023-09-28 ENCOUNTER — Other Ambulatory Visit (HOSPITAL_BASED_OUTPATIENT_CLINIC_OR_DEPARTMENT_OTHER): Payer: Self-pay

## 2023-09-28 ENCOUNTER — Ambulatory Visit: Payer: Self-pay | Admitting: Family Medicine

## 2023-09-28 LAB — BETA 2 TRANSFERRIN: Beta-2 Transferrin, Fluid: NOT DETECTED

## 2023-09-29 ENCOUNTER — Other Ambulatory Visit (HOSPITAL_BASED_OUTPATIENT_CLINIC_OR_DEPARTMENT_OTHER): Payer: Self-pay

## 2023-10-03 ENCOUNTER — Other Ambulatory Visit (HOSPITAL_BASED_OUTPATIENT_CLINIC_OR_DEPARTMENT_OTHER): Payer: Self-pay

## 2023-10-03 LAB — IRON,TIBC AND FERRITIN PANEL
%SAT: 23 % (ref 16–45)
Ferritin: 167 ng/mL (ref 16–232)
Iron: 63 ug/dL (ref 40–190)
TIBC: 270 ug/dL (ref 250–450)

## 2023-10-03 LAB — VITAMIN B1

## 2023-10-04 ENCOUNTER — Telehealth: Payer: Self-pay

## 2023-10-04 ENCOUNTER — Other Ambulatory Visit (HOSPITAL_BASED_OUTPATIENT_CLINIC_OR_DEPARTMENT_OTHER): Payer: Self-pay

## 2023-10-04 ENCOUNTER — Other Ambulatory Visit (HOSPITAL_COMMUNITY): Payer: Self-pay

## 2023-10-04 NOTE — Telephone Encounter (Signed)
Called pt - lab appt scheduled.

## 2023-10-04 NOTE — Telephone Encounter (Signed)
 Pharmacy Patient Advocate Encounter   Received notification from Onbase that prior authorization for Nurtec 75MG  dispersible tablets  is required/requested.   Insurance verification completed.   The patient is insured through [facility] .   Per test claim: PA required; PA submitted to above mentioned insurance via Latent Key/confirmation #/EOC [id number] Status is pending

## 2023-10-04 NOTE — Telephone Encounter (Signed)
 Pharmacy Patient Advocate Encounter  Received notification from [facility] that Prior Authorization for Nurtec 75MG  dispersible tablets  has been APPROVED from 10/04/23 to 10/03/24. Ran test claim, Copay is $0. This test claim was processed through [facility]- copay amounts may vary at other pharmacies due to pharmacy/plan contracts, or as the patient moves through the different stages of their insurance plan.   PA #/Case ID/Reference #: [id number]

## 2023-10-05 ENCOUNTER — Other Ambulatory Visit (INDEPENDENT_AMBULATORY_CARE_PROVIDER_SITE_OTHER)

## 2023-10-05 ENCOUNTER — Other Ambulatory Visit (HOSPITAL_COMMUNITY): Payer: Self-pay

## 2023-10-05 DIAGNOSIS — Z9884 Bariatric surgery status: Secondary | ICD-10-CM | POA: Diagnosis not present

## 2023-10-09 ENCOUNTER — Ambulatory Visit: Admitting: Family Medicine

## 2023-10-10 ENCOUNTER — Emergency Department (HOSPITAL_BASED_OUTPATIENT_CLINIC_OR_DEPARTMENT_OTHER)
Admission: EM | Admit: 2023-10-10 | Discharge: 2023-10-10 | Disposition: A | Discharge status: Home / Self Care | Attending: Emergency Medicine | Admitting: Emergency Medicine

## 2023-10-10 ENCOUNTER — Encounter (HOSPITAL_BASED_OUTPATIENT_CLINIC_OR_DEPARTMENT_OTHER): Payer: Self-pay

## 2023-10-10 DIAGNOSIS — E119 Type 2 diabetes mellitus without complications: Secondary | ICD-10-CM | POA: Diagnosis not present

## 2023-10-10 DIAGNOSIS — W540XXA Bitten by dog, initial encounter: Secondary | ICD-10-CM | POA: Diagnosis not present

## 2023-10-10 DIAGNOSIS — S61451A Open bite of right hand, initial encounter: Secondary | ICD-10-CM | POA: Diagnosis not present

## 2023-10-10 DIAGNOSIS — S61051A Open bite of right thumb without damage to nail, initial encounter: Secondary | ICD-10-CM | POA: Diagnosis present

## 2023-10-10 MED ORDER — AMOXICILLIN-POT CLAVULANATE 875-125 MG PO TABS
1.0000 | ORAL_TABLET | Freq: Once | ORAL | Status: AC
  Administered 2023-10-10: 1 via ORAL
  Filled 2023-10-10: qty 1

## 2023-10-10 MED ORDER — AMOXICILLIN-POT CLAVULANATE 875-125 MG PO TABS
1.0000 | ORAL_TABLET | Freq: Two times a day (BID) | ORAL | 0 refills | Status: DC
Start: 2023-10-10 — End: 2023-10-20

## 2023-10-10 NOTE — ED Triage Notes (Addendum)
 Was helping get her 6 week old puppy's leg out of a wagon yesterday and the dog bit her to the right hand. Up to date on vaccines so far, no rabies vaccine yet. Has been indoor dog. Patient up to date on tetanus.

## 2023-10-10 NOTE — ED Notes (Signed)
 Pt alert and oriented X 4 at the time of discharge. RR even and unlabored. No acute distress noted. Pt verbalized understanding of discharge instructions as discussed. Pt ambulatory to lobby at time of discharge.

## 2023-10-10 NOTE — ED Provider Notes (Signed)
 [facility] Provider Note   CSN: [id number] Arrival date & time: 10/10/23  1549     Patient presents with: Animal Bite   Maria Johnson is a 43 y.o. female with PMHx HLD, DM, anemia, who presents to the ED concerned for dog bite. Patient was bitten by her own puppy while they were attempting to get the puppy's leg out of a wagon yesterday. Patient stating that puppy is so far up to date on all vaccines, but it has not been due for their first rabies vaccine yet since it is only 7 weeks old. Puppy is otherwise acting normal. Puppy lives indoors and does not have contact with other outside animals. Patient also stating that she is up to date on her tetanus vaccines. Patient denies any acute symptoms today such as fever, paresthesias, chest pain, nausea, vomiting.     Animal Bite      Prior to Admission medications   Medication Sig Start Date End Date Taking? Authorizing Provider  acetaminophen  (TYLENOL ) 500 MG tablet Take 500 mg by mouth every 6 (six) hours as needed.    [provider]  ALPRAZolam  (XANAX ) 0.5 MG tablet Take 1 tablet (0.5 mg total) by mouth daily as needed for anxiety. 01/04/23   Frann Mabel Mt, DO  atorvastatin  (LIPITOR) 40 MG tablet TAKE 1 TABLET(40 MG) BY MOUTH DAILY 06/20/22   Frann Mabel Mt, DO  citalopram  (CELEXA ) 20 MG tablet Take 1 tablet (20 mg total) by mouth daily. 09/22/23   Frann Mabel Mt, DO  clobetasol  (TEMOVATE ) 0.05 % external solution APPLY TO AFFECTED AREA OF SCALP TWICE DAILY X 2 WEEKS. TAKE 1-2 WEEKS OFF AND REPEAT AS NEEDED 11/07/22   [provider]  EPINEPHrine  0.3 mg/0.3 mL IJ SOAJ injection Inject 0.3 mg into the muscle as needed for anaphylaxis. 06/22/22   Frann Mabel Mt, DO  famotidine  (PEPCID ) 40 MG tablet Take 1 tablet (40 mg total) by mouth daily. 07/07/23   Frann Mabel Mt, DO  fluconazole  (DIFLUCAN ) 200 MG tablet Take 200 mg by mouth as  needed. 05/04/23   [provider]  fluticasone  (FLONASE ) 50 MCG/ACT nasal spray Place 2 sprays into both nostrils daily. 05/10/23   Frann Mabel Mt, DO  hydrocortisone  2.5 % cream Apply topically 2 (two) times daily. 04/14/23   Lorin Norris, MD  ketorolac  (TORADOL ) 10 MG tablet Take 1 tablet (10 mg total) by mouth every 6 (six) hours as needed for moderate pain (pain score 4-6). 09/23/23   Frann Mabel Mt, DO  Multiple Vitamins-Minerals (MULTIVITAMIN WITH MINERALS) tablet Take 1 tablet by mouth daily.    [provider]  Rimegepant Sulfate  (NURTEC) 75 MG TBDP Dissolve 1 tablet (75 mg total) on the tongue daily as needed for headaches. Repeat in 2 hours if needed. 09/22/23   Frann Mabel Mt, DO  Safety Seal Miscellaneous MISC Apply 1 application  topically every morning. Medication Name: Hormonic Hair 07/06/23   Alm Delon SAILOR, DO  tirzepatide  (MOUNJARO ) 15 MG/0.5ML Pen Inject 15 mg into the skin once a week. 05/10/23   Frann Mabel Mt, DO    Allergies: Cat dander, Dust mite extract, Hydrocodone , Molds & smuts, and Tramadol     Review of Systems  Skin:  Positive for wound.    Updated Vital Signs BP 119/86 (BP Location: Left Arm)   Pulse 85   Temp 98.5 F (36.9 C) (Oral)   Resp 16   LMP 08/09/2007   SpO2 98%  Physical Exam Vitals and nursing note reviewed.  Constitutional:      General: She is not in acute distress.    Appearance: She is not ill-appearing or toxic-appearing.  HENT:     Head: Normocephalic and atraumatic.  Eyes:     General: No scleral icterus.       Right eye: No discharge.        Left eye: No discharge.     Conjunctiva/sclera: Conjunctivae normal.  Cardiovascular:     Rate and Rhythm: Normal rate.  Pulmonary:     Effort: Pulmonary effort is normal.  Abdominal:     General: Abdomen is flat.  Skin:    General: Skin is warm and dry.     Comments: Right hand: small puncture wound on posterior thumb. There is also  a 2cm jagged laceration on anterior thenar part of hand. Wound is hemostatic. No erythema or purulence associated. Brisk capillary refill. +2 radial pulse. Sensation to light touch intact. Active ROM intact.   Neurological:     General: No focal deficit present.     Mental Status: She is alert and oriented to person, place, and time. Mental status is at baseline.  Psychiatric:        Mood and Affect: Mood normal.        Behavior: Behavior normal.     (all labs ordered are listed, but only abnormal results are displayed) Labs Reviewed - No data to display  EKG: None  Radiology: No results found.   Procedures   Medications Ordered in the ED  amoxicillin -clavulanate (AUGMENTIN ) 875-125 MG per tablet 1 tablet (has no administration in time range)                                    Medical Decision Making  This patient presents to the ED for concern of dog bite, this involves an extensive number of treatment options, and is a complaint that carries with it a high risk of complications and morbidity.  The differential diagnosis includes cellulitis, abscess, rabies infection   Co morbidities that complicate the patient evaluation  HLD, DM, anemia   Additional history obtained:  Dr. Frann PCP   Problem List / ED Course / Critical interventions / Medication management  Patient presenting to ED after dog bite.  He has not received rabies vaccines yet as it is only 55 weeks old and dogs usually receive this after 12 weeks.  Puppy otherwise acting normally and family is not concerned for rabies.  Patient without fever, and other vital signs are stable. Wounds appear without erythema/purulence/swelling. Wounds do not appear deep and there is no neurovascular compromise on physical exam. Patient endorsed receiving the tetanus vaccine last April. Animal control notified.  Educated patient that would usually get an x-ray to assess for foreign bodies.  Patient declining x-ray  stating that she is certain there is no foreign body. Patient educated on the need of a prophylactic antibiotic.  Wound cleaned and dressed in ED.  Sent Augmentin  to pharmacy.  Patient tolerated first dose of Augmentin  while in ED.  Patient to follow-up with PCP.  I have reviewed the patients home medicines and have made adjustments as needed. The patient has been appropriately medically screened and/or stabilized in the ED. I have low suspicion for any other emergent medical condition which would require further screening, evaluation or treatment in the ED or require inpatient management. At  time of discharge the patient is hemodynamically stable and in no acute distress. I have discussed work-up results and diagnosis with patient and answered all questions. Patient is agreeable with discharge plan. We discussed strict return precautions for returning to the emergency department and they verbalized understanding.     Social Determinants of Health:  none        Final diagnoses:  Dog bite, initial encounter    ED Discharge Orders     None          Hoy Nidia FALCON, [place] 10/10/23 1642    Rogelia Jerilynn RAMAN, MD 10/11/23 0201

## 2023-10-10 NOTE — Discharge Instructions (Addendum)

## 2023-10-11 ENCOUNTER — Ambulatory Visit: Payer: Self-pay | Admitting: Family Medicine

## 2023-10-11 ENCOUNTER — Ambulatory Visit (INDEPENDENT_AMBULATORY_CARE_PROVIDER_SITE_OTHER): Admitting: Physician Assistant

## 2023-10-11 ENCOUNTER — Other Ambulatory Visit (HOSPITAL_BASED_OUTPATIENT_CLINIC_OR_DEPARTMENT_OTHER): Payer: Self-pay

## 2023-10-11 VITALS — BP 108/77 | HR 87 | Ht 69.0 in | Wt 186.9 lb

## 2023-10-11 DIAGNOSIS — M793 Panniculitis, unspecified: Secondary | ICD-10-CM

## 2023-10-11 LAB — VITAMIN B1: Vitamin B1 (Thiamine): 10 nmol/L (ref 8–30)

## 2023-10-11 MED ORDER — OXYCODONE HCL 5 MG PO TABS
5.0000 mg | ORAL_TABLET | Freq: Three times a day (TID) | ORAL | 0 refills | Status: AC | PRN
  Filled 2023-10-11: qty 15, 5d supply, fill #0

## 2023-10-11 MED ORDER — ONDANSETRON 4 MG PO TBDP
4.0000 mg | ORAL_TABLET | Freq: Three times a day (TID) | ORAL | 0 refills | Status: DC | PRN
  Filled 2023-10-11: qty 18, 6d supply, fill #0

## 2023-10-11 NOTE — H&P (View-Only) (Signed)
 Patient ID: Maria Johnson, female    DOB: [birth year], 43 y.o.   MRN: [id number]  Chief Complaint  Patient presents with   Pre-op Exam      ICD-10-CM   1. Panniculitis  M79.3        History of Present Illness: Maria Johnson is a 43 y.o.  female  with a history of bariatric surgery and recurrent panniculitis.  She presents for preoperative evaluation for upcoming procedure, panniculectomy, scheduled for 10/27/2023 with Dr. Waddell.  The patient has not had problems with anesthesia.  Previous personal hysterectomy as well as lumbar and cervical fusions without complication from anesthesia.  She denies any personal or family history of blood clots or clotting disorder.  Denies any personal history of severe heart or lung disease, CVA/MI, cancer, inflammatory bowel disease, hormone medication, varicosities, or nicotine use.  She does report history of angioedema after hydrocodone  and tramadol  in the past for which she has an EpiPen  at home, but states that she has taken oxycodone  in the past including this past April without issue.  She understands what to do should she for what ever reason develop any  adverse reaction.  Discussed postoperative expectations and restrictions.  Described the procedure in detail.  Any and all questions were answered.  Patient is understanding and agreeable to plan.  Her husband and adult children live with her and will be able to assist with postoperative recovery.  Summary of Previous Visit: Patient was seen for initial consult 08/02/2023.  At that time, it was noted that she had lost over 60 pounds since gastric bypass surgery.  Discussed the panniculectomy with drain placement.  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  She also works part-time in Clinical biochemist at [employer]  Discussed returning to each of her jobs 11/06/2023, but no lifting, pushing, or pulling greater than 10 pounds until she is 6 weeks postop from  surgery.  PMH Significant for: Recurrent panniculitis, obesity, sleeve gastrectomy, T2DM controlled with most recent A1c 6.0% on Mounjaro , HLD.   Past Medical History: Allergies: Allergies  Allergen Reactions   Cat Dander    Dust Mite Extract    Hydrocodone  Swelling   Molds & Smuts Other (See Comments)    Sneezing, runny eyes and nose   Tramadol  Swelling    Current Medications:  Current Outpatient Medications:    acetaminophen  (TYLENOL ) 500 MG tablet, Take 500 mg by mouth every 6 (six) hours as needed., Disp: , Rfl:    ALPRAZolam  (XANAX ) 0.5 MG tablet, Take 1 tablet (0.5 mg total) by mouth daily as needed for anxiety., Disp: 60 tablet, Rfl: 2   amoxicillin -clavulanate (AUGMENTIN ) 875-125 MG tablet, Take 1 tablet by mouth every 12 (twelve) hours., Disp: 14 tablet, Rfl: 0   atorvastatin  (LIPITOR) 40 MG tablet, TAKE 1 TABLET(40 MG) BY MOUTH DAILY, Disp: 90 tablet, Rfl: 3   citalopram  (CELEXA ) 20 MG tablet, Take 1 tablet (20 mg total) by mouth daily., Disp: 30 tablet, Rfl: 3   clobetasol  (TEMOVATE ) 0.05 % external solution, APPLY TO AFFECTED AREA OF SCALP TWICE DAILY X 2 WEEKS. TAKE 1-2 WEEKS OFF AND REPEAT AS NEEDED, Disp: , Rfl:    EPINEPHrine  0.3 mg/0.3 mL IJ SOAJ injection, Inject 0.3 mg into the muscle as needed for anaphylaxis., Disp: 2 each, Rfl: 3   famotidine  (PEPCID ) 40 MG tablet, Take 1 tablet (40 mg total) by mouth daily., Disp: 90 tablet, Rfl: 1   fluconazole  (DIFLUCAN ) 200 MG  tablet, Take 200 mg by mouth as needed., Disp: , Rfl:    hydrocortisone  2.5 % cream, Apply topically 2 (two) times daily., Disp: 30 g, Rfl: 5   ketorolac  (TORADOL ) 10 MG tablet, Take 1 tablet (10 mg total) by mouth every 6 (six) hours as needed for moderate pain (pain score 4-6)., Disp: 20 tablet, Rfl: 0   Multiple Vitamins-Minerals (MULTIVITAMIN WITH MINERALS) tablet, Take 1 tablet by mouth daily., Disp: , Rfl:    ondansetron  (ZOFRAN -ODT) 4 MG disintegrating tablet, Take 1 tablet (4 mg total) by mouth  every 8 (eight) hours as needed for nausea or vomiting., Disp: 20 tablet, Rfl: 0   oxyCODONE  (ROXICODONE ) 5 MG immediate release tablet, Take 1 tablet (5 mg total) by mouth every 8 (eight) hours as needed for up to 5 days for severe pain (pain score 7-10)., Disp: 15 tablet, Rfl: 0   Rimegepant Sulfate  (NURTEC) 75 MG TBDP, Dissolve 1 tablet (75 mg total) on the tongue daily as needed for headaches. Repeat in 2 hours if needed., Disp: 16 tablet, Rfl: 2   Safety Seal Miscellaneous MISC, Apply 1 application  topically every morning. Medication Name: Hormonic Hair, Disp: 30 mL, Rfl: 6   tirzepatide  (MOUNJARO ) 15 MG/0.5ML Pen, Inject 15 mg into the skin once a week., Disp: 6 mL, Rfl: 2   fluticasone  (FLONASE ) 50 MCG/ACT nasal spray, Place 2 sprays into both nostrils daily. (Patient not taking: Reported on 10/11/2023), Disp: 16 g, Rfl: 5  Past Medical Problems: 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   Pes planus 10/22/2015   Pneumonia    Sickle cell trait (HCC)    Sleep apnea    has cpap   SOBOE (shortness of breath on exertion)    Sprain of medial collateral ligament of left knee 10/30/2018   Type 2 diabetes mellitus with hyperglycemia (HCC) 08/12/2013   Vitamin D  deficiency 08/24/2017   Well adult exam 09/25/2017    Past Surgical History: Past Surgical History:  Procedure Laterality Date   ANKLE SURGERY  05/2003   torn cartilage  repair; left    ANTERIOR LUMBAR FUSION  01/06/2011   Procedure: ANTERIOR LUMBAR FUSION 1 LEVEL;  Surgeon: Fairy JONETTA Levels, MD;  Location: [facility];  Service: Neurosurgery;  Laterality: N/A;  Lumbar five-Sacral one  Anterior Lumbar Interbody Fusion with Instrumentation    BACK SURGERY     Laminectomy Sept 14, 2011,   DIAGNOSTIC LAPAROSCOPY  01/2002   removed scar tissue   LAMINECTOMY AND MICRODISCECTOMY LUMBAR SPINE  10/2009   L4, L5, S1   LAPAROSCOPIC GASTRIC SLEEVE RESECTION N/A 12/08/2020   Procedure: LAPAROSCOPIC GASTRIC SLEEVE RESECTION;  Surgeon: Signe Mitzie LABOR, MD;  Location: [facility];  Service: General;  Laterality: N/A;   LUMBAR FUSION  01/06/2011   L5-S1   TOE SURGERY  07/05/2020   TONSILLECTOMY  03/2005   UPPER GI ENDOSCOPY N/A 12/08/2020   Procedure: UPPER GI ENDOSCOPY;  Surgeon: Signe Mitzie LABOR, MD;  Location: [facility];  Service: General;  Laterality: N/A;   VAGINAL HYSTERECTOMY  08/2007   partial; AUB    Social History: Social History   Socioeconomic History   Marital status: Married    Spouse name: Database administrator   Number of children: 3   Years of education: Not on file   Highest education level: Master's degree (e.g., MA, MS, MEng, MEd, MSW, MBA)  Occupational History   Occupation: Scientific laboratory technician  Tobacco Use   Smoking status: Never   Smokeless tobacco: Never  Vaping Use   Vaping status: Never Used  Substance and Sexual Activity   Alcohol use: Not Currently    Comment: occassional   Drug use: No   Sexual activity: Yes    Birth control/protection: Surgical  Other Topics Concern   Not on file  Social History Narrative   Not on file   Social Drivers of Health   Financial Resource Strain: Low Risk  (09/04/2023)   Overall Financial Resource Strain (CARDIA)    Difficulty of Paying Living Expenses: Not very hard  Food Insecurity: No Food Insecurity (09/04/2023)   Hunger Vital Sign    Worried About Running Out of Food in the Last Year: Never true    Ran Out of  Food in the Last Year: Never true  Transportation Needs: No Transportation Needs (09/04/2023)   PRAPARE - Administrator, Civil Service (Medical): No    Lack of Transportation (Non-Medical): No  Physical Activity: Sufficiently Active (09/04/2023)   Exercise Vital Sign    Days of Exercise per Week: 5 days    Minutes of Exercise per Session: 60 min  Stress: No Stress Concern Present (09/04/2023)   [employer] of Occupational Health - Occupational Stress Questionnaire    Feeling of Stress: Only a little  Social Connections: Moderately Integrated (09/04/2023)   Social Connection and Isolation Panel    Frequency of Communication with Friends and Family: More than three times a week    Frequency of Social Gatherings with Friends and Family: Three times a week    Attends Religious Services: More than 4 times per year    Active Member of Clubs or Organizations: No    Attends Banker Meetings: Not on file    Marital Status: Married  Intimate Partner Violence: Unknown (05/03/2021)   Received from [facility]   HITS    Physically Hurt: Not on file    Insult or Talk Down To: Not on file    Threaten Physical Harm: Not on file    Scream or Curse: Not on file    Family History: Family History  Problem Relation Age of Onset   Miscarriages / Stillbirths Mother    Hypertension Mother    Alcohol abuse Father    Depression Father    Drug abuse Father    Hyperlipidemia Father    Stroke Father    Anxiety disorder Father    Sleep apnea Father    Alcoholism Father    Depression Daughter    Diabetes Daughter    Hypertension Daughter    Miscarriages / [place] Daughter    Birth defects Son    Diabetes Maternal Grandmother    Heart attack Maternal Grandmother    Hypertension Maternal Grandmother    Stroke Maternal Grandmother    Diabetes Maternal Grandfather  Hypertension Maternal Grandfather    Diabetes Paternal Grandmother    Lupus Paternal Grandmother      Review of Systems: ROS Denies any recent chest pain, difficulty breathing, leg swelling, or fevers.  Physical Exam: Vital Signs BP 108/77 (BP Location: Left Arm, Patient Position: Sitting, Cuff Size: Large)   Pulse 87   Ht 5' 9 (1.753 m)   Wt 186 lb 14.4 oz (84.8 kg)   LMP 08/09/2007   SpO2 97%   BMI 27.60 kg/m   Physical Exam  Constitutional:      General: Not in acute distress.    Appearance: Normal appearance. Not ill-appearing.  HENT:     Head: Normocephalic and atraumatic.  Eyes:     Pupils: Pupils are equal, round. Cardiovascular:     Rate and Rhythm: Normal rate.    Pulses: Normal pulses.  Pulmonary:     Effort: No respiratory distress or increased work of breathing.  Speaks in full sentences. Abdominal:     General: Abdomen is flat. No distension.   Musculoskeletal: Normal range of motion. No lower extremity swelling or edema. No varicosities. Skin:    General: Skin is warm and dry.     Findings: No erythema or rash.  Neurological:     Mental Status: Alert and oriented to person, place, and time.  Psychiatric:        Mood and Affect: Mood normal.        Behavior: Behavior normal.    Assessment/Plan: The patient is scheduled for panniculectomy with Dr. Waddell.  Risks, benefits, and alternatives of procedure discussed, questions answered and consent obtained.    Smoking Status: Non-smoker.  Caprini Score: 4; Risk Factors include: Age, BMI greater than 25, and length of planned surgery. Recommendation for mechanical prophylaxis. Encourage early ambulation.   Pictures obtained: 08/03/2023  Post-op Rx sent to pharmacy: Oxycodone  and Zofran . She does report history of angioedema after hydrocodone  and tramadol  in the past for which she has an EpiPen  at home, but states that she has taken oxycodone  in the past including this past April without issue.  She understands what to do should she for what ever reason develop any  adverse reaction.   Patient was  provided with the General Surgical Risk consent document and Pain Medication Agreement prior to their appointment.  They had adequate time to read through the risk consent documents and Pain Medication Agreement. We also discussed them in person together during this preop appointment. All of their questions were answered to their satisfaction.  Recommended calling if they have any further questions.  Risk consent form and Pain Medication Agreement to be scanned into patient's chart.  The risk that can be encountered for this procedure were discussed and include the following but not limited to these: asymmetry, fluid accumulation, firmness of the tissue, skin loss, decrease or no sensation, fat necrosis, bleeding, infection, healing delay.  Deep vein thrombosis, cardiac and pulmonary complications are risks to any procedure.  There are risks of anesthesia, changes to skin sensation and injury to nerves or blood vessels.  The muscle can be temporarily or permanently injured.  You may have an allergic reaction to tape, suture, glue, blood products which can result in skin discoloration, swelling, pain, skin lesions, poor healing.  Any of these can lead to the need for revisonal surgery or stage procedures.  Weight gain and weigh loss can also effect the long term appearance. The results are not guaranteed to last a lifetime.  Future surgery may  be required.     Electronically signed by: Honora Seip, PA-C 10/11/2023 1:37 PM

## 2023-10-11 NOTE — Progress Notes (Signed)
 Patient ID: Maria Johnson, female    DOB: [birth year], 43 y.o.   MRN: [id number]  Chief Complaint  Patient presents with   Pre-op Exam      ICD-10-CM   1. Panniculitis  M79.3        History of Present Illness: Maria Johnson is a 43 y.o.  female  with a history of bariatric surgery and recurrent panniculitis.  She presents for preoperative evaluation for upcoming procedure, panniculectomy, scheduled for 10/27/2023 with Dr. Waddell.  The patient has not had problems with anesthesia.  Previous personal hysterectomy as well as lumbar and cervical fusions without complication from anesthesia.  She denies any personal or family history of blood clots or clotting disorder.  Denies any personal history of severe heart or lung disease, CVA/MI, cancer, inflammatory bowel disease, hormone medication, varicosities, or nicotine use.  She does report history of angioedema after hydrocodone  and tramadol  in the past for which she has an EpiPen  at home, but states that she has taken oxycodone  in the past including this past April without issue.  She understands what to do should she for what ever reason develop any  adverse reaction.  Discussed postoperative expectations and restrictions.  Described the procedure in detail.  Any and all questions were answered.  Patient is understanding and agreeable to plan.  Her husband and adult children live with her and will be able to assist with postoperative recovery.  Summary of Previous Visit: Patient was seen for initial consult 08/02/2023.  At that time, it was noted that she had lost over 60 pounds since gastric bypass surgery.  Discussed the panniculectomy with drain placement.  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  She also works part-time in Clinical biochemist at [employer]  Discussed returning to each of her jobs 11/06/2023, but no lifting, pushing, or pulling greater than 10 pounds until she is 6 weeks postop from  surgery.  PMH Significant for: Recurrent panniculitis, obesity, sleeve gastrectomy, T2DM controlled with most recent A1c 6.0% on Mounjaro , HLD.   Past Medical History: Allergies: Allergies  Allergen Reactions   Cat Dander    Dust Mite Extract    Hydrocodone  Swelling   Molds & Smuts Other (See Comments)    Sneezing, runny eyes and nose   Tramadol  Swelling    Current Medications:  Current Outpatient Medications:    acetaminophen  (TYLENOL ) 500 MG tablet, Take 500 mg by mouth every 6 (six) hours as needed., Disp: , Rfl:    ALPRAZolam  (XANAX ) 0.5 MG tablet, Take 1 tablet (0.5 mg total) by mouth daily as needed for anxiety., Disp: 60 tablet, Rfl: 2   amoxicillin -clavulanate (AUGMENTIN ) 875-125 MG tablet, Take 1 tablet by mouth every 12 (twelve) hours., Disp: 14 tablet, Rfl: 0   atorvastatin  (LIPITOR) 40 MG tablet, TAKE 1 TABLET(40 MG) BY MOUTH DAILY, Disp: 90 tablet, Rfl: 3   citalopram  (CELEXA ) 20 MG tablet, Take 1 tablet (20 mg total) by mouth daily., Disp: 30 tablet, Rfl: 3   clobetasol  (TEMOVATE ) 0.05 % external solution, APPLY TO AFFECTED AREA OF SCALP TWICE DAILY X 2 WEEKS. TAKE 1-2 WEEKS OFF AND REPEAT AS NEEDED, Disp: , Rfl:    EPINEPHrine  0.3 mg/0.3 mL IJ SOAJ injection, Inject 0.3 mg into the muscle as needed for anaphylaxis., Disp: 2 each, Rfl: 3   famotidine  (PEPCID ) 40 MG tablet, Take 1 tablet (40 mg total) by mouth daily., Disp: 90 tablet, Rfl: 1   fluconazole  (DIFLUCAN ) 200 MG  tablet, Take 200 mg by mouth as needed., Disp: , Rfl:    hydrocortisone  2.5 % cream, Apply topically 2 (two) times daily., Disp: 30 g, Rfl: 5   ketorolac  (TORADOL ) 10 MG tablet, Take 1 tablet (10 mg total) by mouth every 6 (six) hours as needed for moderate pain (pain score 4-6)., Disp: 20 tablet, Rfl: 0   Multiple Vitamins-Minerals (MULTIVITAMIN WITH MINERALS) tablet, Take 1 tablet by mouth daily., Disp: , Rfl:    ondansetron  (ZOFRAN -ODT) 4 MG disintegrating tablet, Take 1 tablet (4 mg total) by mouth  every 8 (eight) hours as needed for nausea or vomiting., Disp: 20 tablet, Rfl: 0   oxyCODONE  (ROXICODONE ) 5 MG immediate release tablet, Take 1 tablet (5 mg total) by mouth every 8 (eight) hours as needed for up to 5 days for severe pain (pain score 7-10)., Disp: 15 tablet, Rfl: 0   Rimegepant Sulfate  (NURTEC) 75 MG TBDP, Dissolve 1 tablet (75 mg total) on the tongue daily as needed for headaches. Repeat in 2 hours if needed., Disp: 16 tablet, Rfl: 2   Safety Seal Miscellaneous MISC, Apply 1 application  topically every morning. Medication Name: Hormonic Hair, Disp: 30 mL, Rfl: 6   tirzepatide  (MOUNJARO ) 15 MG/0.5ML Pen, Inject 15 mg into the skin once a week., Disp: 6 mL, Rfl: 2   fluticasone  (FLONASE ) 50 MCG/ACT nasal spray, Place 2 sprays into both nostrils daily. (Patient not taking: Reported on 10/11/2023), Disp: 16 g, Rfl: 5  Past Medical Problems: 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   Pes planus 10/22/2015   Pneumonia    Sickle cell trait (HCC)    Sleep apnea    has cpap   SOBOE (shortness of breath on exertion)    Sprain of medial collateral ligament of left knee 10/30/2018   Type 2 diabetes mellitus with hyperglycemia (HCC) 08/12/2013   Vitamin D  deficiency 08/24/2017   Well adult exam 09/25/2017    Past Surgical History: Past Surgical History:  Procedure Laterality Date   ANKLE SURGERY  05/2003   torn cartilage  repair; left    ANTERIOR LUMBAR FUSION  01/06/2011   Procedure: ANTERIOR LUMBAR FUSION 1 LEVEL;  Surgeon: Fairy JONETTA Levels, MD;  Location: [facility];  Service: Neurosurgery;  Laterality: N/A;  Lumbar five-Sacral one  Anterior Lumbar Interbody Fusion with Instrumentation    BACK SURGERY     Laminectomy Sept 14, 2011,   DIAGNOSTIC LAPAROSCOPY  01/2002   removed scar tissue   LAMINECTOMY AND MICRODISCECTOMY LUMBAR SPINE  10/2009   L4, L5, S1   LAPAROSCOPIC GASTRIC SLEEVE RESECTION N/A 12/08/2020   Procedure: LAPAROSCOPIC GASTRIC SLEEVE RESECTION;  Surgeon: Signe Mitzie LABOR, MD;  Location: [facility];  Service: General;  Laterality: N/A;   LUMBAR FUSION  01/06/2011   L5-S1   TOE SURGERY  07/05/2020   TONSILLECTOMY  03/2005   UPPER GI ENDOSCOPY N/A 12/08/2020   Procedure: UPPER GI ENDOSCOPY;  Surgeon: Signe Mitzie LABOR, MD;  Location: [facility];  Service: General;  Laterality: N/A;   VAGINAL HYSTERECTOMY  08/2007   partial; AUB    Social History: Social History   Socioeconomic History   Marital status: Married    Spouse name: Database administrator   Number of children: 3   Years of education: Not on file   Highest education level: Master's degree (e.g., MA, MS, MEng, MEd, MSW, MBA)  Occupational History   Occupation: Scientific laboratory technician  Tobacco Use   Smoking status: Never   Smokeless tobacco: Never  Vaping Use   Vaping status: Never Used  Substance and Sexual Activity   Alcohol use: Not Currently    Comment: occassional   Drug use: No   Sexual activity: Yes    Birth control/protection: Surgical  Other Topics Concern   Not on file  Social History Narrative   Not on file   Social Drivers of Health   Financial Resource Strain: Low Risk  (09/04/2023)   Overall Financial Resource Strain (CARDIA)    Difficulty of Paying Living Expenses: Not very hard  Food Insecurity: No Food Insecurity (09/04/2023)   Hunger Vital Sign    Worried About Running Out of Food in the Last Year: Never true    Ran Out of  Food in the Last Year: Never true  Transportation Needs: No Transportation Needs (09/04/2023)   PRAPARE - Administrator, Civil Service (Medical): No    Lack of Transportation (Non-Medical): No  Physical Activity: Sufficiently Active (09/04/2023)   Exercise Vital Sign    Days of Exercise per Week: 5 days    Minutes of Exercise per Session: 60 min  Stress: No Stress Concern Present (09/04/2023)   [employer] of Occupational Health - Occupational Stress Questionnaire    Feeling of Stress: Only a little  Social Connections: Moderately Integrated (09/04/2023)   Social Connection and Isolation Panel    Frequency of Communication with Friends and Family: More than three times a week    Frequency of Social Gatherings with Friends and Family: Three times a week    Attends Religious Services: More than 4 times per year    Active Member of Clubs or Organizations: No    Attends Banker Meetings: Not on file    Marital Status: Married  Intimate Partner Violence: Unknown (05/03/2021)   Received from [facility]   HITS    Physically Hurt: Not on file    Insult or Talk Down To: Not on file    Threaten Physical Harm: Not on file    Scream or Curse: Not on file    Family History: Family History  Problem Relation Age of Onset   Miscarriages / Stillbirths Mother    Hypertension Mother    Alcohol abuse Father    Depression Father    Drug abuse Father    Hyperlipidemia Father    Stroke Father    Anxiety disorder Father    Sleep apnea Father    Alcoholism Father    Depression Daughter    Diabetes Daughter    Hypertension Daughter    Miscarriages / [place] Daughter    Birth defects Son    Diabetes Maternal Grandmother    Heart attack Maternal Grandmother    Hypertension Maternal Grandmother    Stroke Maternal Grandmother    Diabetes Maternal Grandfather  Hypertension Maternal Grandfather    Diabetes Paternal Grandmother    Lupus Paternal Grandmother      Review of Systems: ROS Denies any recent chest pain, difficulty breathing, leg swelling, or fevers.  Physical Exam: Vital Signs BP 108/77 (BP Location: Left Arm, Patient Position: Sitting, Cuff Size: Large)   Pulse 87   Ht 5' 9 (1.753 m)   Wt 186 lb 14.4 oz (84.8 kg)   LMP 08/09/2007   SpO2 97%   BMI 27.60 kg/m   Physical Exam  Constitutional:      General: Not in acute distress.    Appearance: Normal appearance. Not ill-appearing.  HENT:     Head: Normocephalic and atraumatic.  Eyes:     Pupils: Pupils are equal, round. Cardiovascular:     Rate and Rhythm: Normal rate.    Pulses: Normal pulses.  Pulmonary:     Effort: No respiratory distress or increased work of breathing.  Speaks in full sentences. Abdominal:     General: Abdomen is flat. No distension.   Musculoskeletal: Normal range of motion. No lower extremity swelling or edema. No varicosities. Skin:    General: Skin is warm and dry.     Findings: No erythema or rash.  Neurological:     Mental Status: Alert and oriented to person, place, and time.  Psychiatric:        Mood and Affect: Mood normal.        Behavior: Behavior normal.    Assessment/Plan: The patient is scheduled for panniculectomy with Dr. Waddell.  Risks, benefits, and alternatives of procedure discussed, questions answered and consent obtained.    Smoking Status: Non-smoker.  Caprini Score: 4; Risk Factors include: Age, BMI greater than 25, and length of planned surgery. Recommendation for mechanical prophylaxis. Encourage early ambulation.   Pictures obtained: 08/03/2023  Post-op Rx sent to pharmacy: Oxycodone  and Zofran . She does report history of angioedema after hydrocodone  and tramadol  in the past for which she has an EpiPen  at home, but states that she has taken oxycodone  in the past including this past April without issue.  She understands what to do should she for what ever reason develop any  adverse reaction.   Patient was  provided with the General Surgical Risk consent document and Pain Medication Agreement prior to their appointment.  They had adequate time to read through the risk consent documents and Pain Medication Agreement. We also discussed them in person together during this preop appointment. All of their questions were answered to their satisfaction.  Recommended calling if they have any further questions.  Risk consent form and Pain Medication Agreement to be scanned into patient's chart.  The risk that can be encountered for this procedure were discussed and include the following but not limited to these: asymmetry, fluid accumulation, firmness of the tissue, skin loss, decrease or no sensation, fat necrosis, bleeding, infection, healing delay.  Deep vein thrombosis, cardiac and pulmonary complications are risks to any procedure.  There are risks of anesthesia, changes to skin sensation and injury to nerves or blood vessels.  The muscle can be temporarily or permanently injured.  You may have an allergic reaction to tape, suture, glue, blood products which can result in skin discoloration, swelling, pain, skin lesions, poor healing.  Any of these can lead to the need for revisonal surgery or stage procedures.  Weight gain and weigh loss can also effect the long term appearance. The results are not guaranteed to last a lifetime.  Future surgery may  be required.     Electronically signed by: Honora Seip, PA-C 10/11/2023 1:37 PM

## 2023-10-12 ENCOUNTER — Encounter: Admitting: Student

## 2023-10-12 DIAGNOSIS — J3081 Allergic rhinitis due to animal (cat) (dog) hair and dander: Secondary | ICD-10-CM | POA: Diagnosis not present

## 2023-10-12 DIAGNOSIS — J302 Other seasonal allergic rhinitis: Secondary | ICD-10-CM | POA: Diagnosis not present

## 2023-10-12 DIAGNOSIS — J3089 Other allergic rhinitis: Secondary | ICD-10-CM | POA: Diagnosis not present

## 2023-10-12 NOTE — Progress Notes (Signed)
 VIAL MADE 10-12-23

## 2023-10-18 ENCOUNTER — Other Ambulatory Visit (HOSPITAL_BASED_OUTPATIENT_CLINIC_OR_DEPARTMENT_OTHER): Payer: Self-pay

## 2023-10-19 ENCOUNTER — Other Ambulatory Visit (HOSPITAL_BASED_OUTPATIENT_CLINIC_OR_DEPARTMENT_OTHER): Payer: Self-pay

## 2023-10-20 ENCOUNTER — Encounter (HOSPITAL_BASED_OUTPATIENT_CLINIC_OR_DEPARTMENT_OTHER): Payer: Self-pay | Admitting: Plastic Surgery

## 2023-10-20 ENCOUNTER — Other Ambulatory Visit: Payer: Self-pay

## 2023-10-20 NOTE — Progress Notes (Signed)
   10/20/23 1554  PAT Phone Screen  Is the patient taking a GLP-1 receptor agonist? Yes  Has the patient been informed on holding medication? Yes (9/14 last dose will hold 9/21 dose)  Do You Have Diabetes? Yes (no meds last a1c 6 07/2023)  Do You Have Hypertension? No  Have You Ever Been to the ER for Asthma? No  Have You Taken Oral Steroids in the Past 3 Months? No  Do you Take Phenteramine or any Other Diet Drugs? No  Recent  Lab Work, EKG, CXR? Yes  Where was this test performed? EKG 04/17/23  Do you have a history of heart problems? No  Have you ever had tests on your heart? Yes  What cardiac tests were performed? Echo  What date/year were cardiac tests completed? 2022 EF 60-65%  Results viewable: [facility] Media Tab  Any Recent Hospitalizations? No  Height 5' 9 (1.753 m)  Weight 84.8 kg  Pat Appointment Scheduled No  Reason for No Appointment Not Needed

## 2023-10-23 ENCOUNTER — Encounter: Payer: Self-pay | Admitting: Family Medicine

## 2023-10-23 ENCOUNTER — Other Ambulatory Visit (HOSPITAL_BASED_OUTPATIENT_CLINIC_OR_DEPARTMENT_OTHER): Payer: Self-pay

## 2023-10-23 ENCOUNTER — Ambulatory Visit (INDEPENDENT_AMBULATORY_CARE_PROVIDER_SITE_OTHER): Admitting: Family Medicine

## 2023-10-23 VITALS — BP 104/60 | HR 87 | Temp 99.0°F | Resp 18 | Ht 69.0 in | Wt 187.0 lb

## 2023-10-23 DIAGNOSIS — E1169 Type 2 diabetes mellitus with other specified complication: Secondary | ICD-10-CM | POA: Diagnosis not present

## 2023-10-23 DIAGNOSIS — E669 Obesity, unspecified: Secondary | ICD-10-CM

## 2023-10-23 DIAGNOSIS — F411 Generalized anxiety disorder: Secondary | ICD-10-CM

## 2023-10-23 DIAGNOSIS — G43909 Migraine, unspecified, not intractable, without status migrainosus: Secondary | ICD-10-CM | POA: Diagnosis not present

## 2023-10-23 MED ORDER — ATORVASTATIN CALCIUM 40 MG PO TABS
40.0000 mg | ORAL_TABLET | Freq: Every day | ORAL | 3 refills | Status: AC
  Filled 2023-10-23: qty 90, 90d supply, fill #0
  Filled 2024-01-27: qty 90, 90d supply, fill #1

## 2023-10-23 NOTE — Patient Instructions (Signed)
Let us know if you need anything.

## 2023-10-23 NOTE — Progress Notes (Signed)
 Chief Complaint  Patient presents with   Follow-up    Subjective Maria Johnson presents for f/u anxiety.  Pt is currently being treated with Celexa  20 mg/d.  Reports doing around 80% improvement since treatment. No thoughts of harming self or others. No self-medication with alcohol, prescription drugs or illicit drugs. Pt is following with a counselor/psychologist.  Migraines Hx of of migraines. Not on prophylaxis. Switched from Maxalt  to Nurtec. Works very well. Since anxiety improved, having fewer migraines and higher intensity.   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 If you have any questions, ask your nurse or doctor.          acetaminophen  500 MG tablet Commonly known as: TYLENOL  Take 500 mg by mouth every 6 (six) hours as needed.   ALPRAZolam  0.5 MG tablet Commonly known as: XANAX  Take 1 tablet (0.5 mg total) by mouth daily as  needed for anxiety.   atorvastatin  40 MG tablet Commonly known as: LIPITOR Take 1 tablet (40 mg total) by mouth daily. What changed: See the new instructions. Changed by: Mabel Deward Pry   citalopram  20 MG tablet Commonly known as: CELEXA  Take 1 tablet (20 mg total) by mouth daily.   clobetasol  0.05 % external solution Commonly known as: TEMOVATE  APPLY TO AFFECTED AREA OF SCALP TWICE DAILY X 2 WEEKS. TAKE 1-2 WEEKS OFF AND REPEAT AS NEEDED   EPINEPHrine  0.3 mg/0.3 mL Soaj injection Commonly known as: EPI-PEN Inject 0.3 mg into the muscle as needed for anaphylaxis.   famotidine  40 MG tablet Commonly known as: Pepcid  Take 1 tablet (40 mg total) by mouth daily.   fluconazole  200 MG tablet Commonly known as: DIFLUCAN  Take 200 mg by mouth as needed.   fluticasone  50 MCG/ACT nasal spray Commonly known as: FLONASE  Place 2 sprays into both nostrils daily.   hydrocortisone  2.5 % cream Apply topically 2 (two) times daily.   ketorolac  10 MG tablet Commonly known as: TORADOL  Take 1 tablet (10 mg total) by mouth every 6 (six) hours as needed for moderate pain (pain score 4-6).   Mounjaro  15 MG/0.5ML Pen Generic drug: tirzepatide  Inject 15 mg into the skin once a week.   multivitamin  with minerals tablet Take 1 tablet by mouth daily.   Nurtec 75 MG Tbdp Generic drug: Rimegepant Sulfate  Dissolve 1 tablet (75 mg total) on the tongue daily as needed for headaches. Repeat in 2 hours if needed.   ondansetron  4 MG disintegrating tablet Commonly known as: ZOFRAN -ODT Take 1 tablet (4 mg total) by mouth every 8 (eight) hours as needed for nausea or vomiting.   Safety Seal Miscellaneous Misc Apply 1 application  topically every morning. Medication Name: Hormonic Hair        Exam BP 104/60   Pulse 87   Temp 99 F (37.2 C)   Resp 18   Ht 5' 9 (1.753 m)   Wt 187 lb (84.8 kg)   LMP 08/09/2007   SpO2 98%   BMI 27.62 kg/m  General:  well developed, well nourished,  in no apparent distress MSK: No ttp over traps, neck, TMJ, temporalis. Neuro: DTR's equal and symmetric throughout, no clonus, 5/5 strength throughout, gait is nml Lungs:  No respiratory distress Psych: well oriented with normal range of affect and age-appropriate judgement/insight, alert and oriented x4.  Assessment and Plan  Generalized anxiety disorder  Migraine without status migrainosus, not intractable, unspecified migraine type  Type 2 diabetes mellitus with obesity (HCC) - Plan: atorvastatin  (LIPITOR) 40 MG tablet  Chronic, stable. Cont Celexa  20 mg/d. Cont w therapist. Chronic, stable. Cont Nurtec 75 mg prn.  F/u as originally scheduled. The patient voiced understanding and agreement to the plan.  Mabel [place], DO 10/23/23 10:48 AM

## 2023-10-25 ENCOUNTER — Ambulatory Visit (INDEPENDENT_AMBULATORY_CARE_PROVIDER_SITE_OTHER): Payer: Self-pay

## 2023-10-25 DIAGNOSIS — J309 Allergic rhinitis, unspecified: Secondary | ICD-10-CM

## 2023-10-25 MED ORDER — CHLORHEXIDINE GLUCONATE CLOTH 2 % EX PADS: 6.0000 | MEDICATED_PAD | Freq: Once | CUTANEOUS | Status: DC

## 2023-10-25 NOTE — Progress Notes (Signed)
    Enhanced Recovery after Surgery  Enhanced Recovery after Surgery is a protocol used to improve the stress on your body and your recovery after surgery.  Patient Instructions  The night before surgery:  No food after midnight. ONLY clear liquids after midnight  The day of surgery (if you do NOT have diabetes):  Drink ONE (1) Pre-Surgery Clear Ensure as directed.   This drink was given to you during your hospital  pre-op appointment visit. The pre-op nurse will instruct you on the time to drink the  Pre-Surgery Ensure depending on your surgery time. Finish the drink at the designated time by the pre-op nurse.  Nothing else to drink after completing the  Pre-Surgery Clear Ensure.  The day of surgery (if you have diabetes): Drink ONE (1) Gatorade 2 (G2) as directed. This drink was given to you during your hospital  pre-op appointment visit.  The pre-op nurse will instruct you on the time to drink the   Gatorade 2 (G2) depending on your surgery time. Color of the Gatorade may vary. Red is not allowed. Nothing else to drink after completing the  Gatorade 2 (G2).         If you have questions, please contact your surgeon's office.  CHG and instructions given as well.

## 2023-10-26 DIAGNOSIS — Z719 Counseling, unspecified: Secondary | ICD-10-CM

## 2023-10-27 ENCOUNTER — Ambulatory Visit (HOSPITAL_BASED_OUTPATIENT_CLINIC_OR_DEPARTMENT_OTHER)
Admission: RE | Admit: 2023-10-27 | Discharge: 2023-10-27 | Disposition: A | Discharge status: Home / Self Care | Attending: Plastic Surgery | Admitting: Plastic Surgery

## 2023-10-27 ENCOUNTER — Encounter (HOSPITAL_BASED_OUTPATIENT_CLINIC_OR_DEPARTMENT_OTHER): Payer: Self-pay | Admitting: Plastic Surgery

## 2023-10-27 ENCOUNTER — Encounter (HOSPITAL_BASED_OUTPATIENT_CLINIC_OR_DEPARTMENT_OTHER)
Admission: RE | Disposition: A | Discharge status: Home / Self Care | Payer: Self-pay | Source: Home / Self Care | Attending: Plastic Surgery

## 2023-10-27 ENCOUNTER — Ambulatory Visit (HOSPITAL_BASED_OUTPATIENT_CLINIC_OR_DEPARTMENT_OTHER): Admitting: Certified Registered"

## 2023-10-27 ENCOUNTER — Other Ambulatory Visit: Payer: Self-pay

## 2023-10-27 DIAGNOSIS — Z9884 Bariatric surgery status: Secondary | ICD-10-CM | POA: Diagnosis not present

## 2023-10-27 DIAGNOSIS — M793 Panniculitis, unspecified: Secondary | ICD-10-CM

## 2023-10-27 DIAGNOSIS — E119 Type 2 diabetes mellitus without complications: Secondary | ICD-10-CM | POA: Diagnosis not present

## 2023-10-27 DIAGNOSIS — R21 Rash and other nonspecific skin eruption: Secondary | ICD-10-CM

## 2023-10-27 DIAGNOSIS — G4730 Sleep apnea, unspecified: Secondary | ICD-10-CM | POA: Insufficient documentation

## 2023-10-27 HISTORY — PX: PANNICULECTOMY: SHX5360

## 2023-10-27 SURGERY — PANNICULECTOMY
Anesthesia: General | Site: Abdomen

## 2023-10-27 MED ORDER — 0.9 % SODIUM CHLORIDE (POUR BTL) OPTIME
TOPICAL | Status: DC | PRN
  Administered 2023-10-27: 1000 mL

## 2023-10-27 MED ORDER — SODIUM CHLORIDE (PF) 0.9 % IJ SOLN
INTRAMUSCULAR | Status: DC | PRN
  Administered 2023-10-27: 100 mL

## 2023-10-27 MED ORDER — ACETAMINOPHEN 500 MG PO TABS: 1000.0000 mg | ORAL_TABLET | Freq: Once | ORAL | Status: DC

## 2023-10-27 MED ORDER — ONDANSETRON HCL 4 MG/2ML IJ SOLN
INTRAMUSCULAR | Status: DC | PRN
  Administered 2023-10-27: 4 mg via INTRAVENOUS

## 2023-10-27 MED ORDER — EPHEDRINE 5 MG/ML INJ
INTRAVENOUS | Status: AC
  Filled 2023-10-27: qty 5

## 2023-10-27 MED ORDER — ROCURONIUM BROMIDE 100 MG/10ML IV SOLN
INTRAVENOUS | Status: DC | PRN
  Administered 2023-10-27: 60 mg via INTRAVENOUS

## 2023-10-27 MED ORDER — SUCCINYLCHOLINE CHLORIDE 200 MG/10ML IV SOSY
PREFILLED_SYRINGE | INTRAVENOUS | Status: AC
Start: 2023-10-27 — End: 2023-10-27
  Filled 2023-10-27: qty 10

## 2023-10-27 MED ORDER — LIDOCAINE 2% (20 MG/ML) 5 ML SYRINGE
INTRAMUSCULAR | Status: DC | PRN
  Administered 2023-10-27: 50 mg via INTRAVENOUS

## 2023-10-27 MED ORDER — MIDAZOLAM HCL 5 MG/5ML IJ SOLN
INTRAMUSCULAR | Status: DC | PRN
  Administered 2023-10-27: 2 mg via INTRAVENOUS

## 2023-10-27 MED ORDER — OXYCODONE HCL 5 MG PO TABS
5.0000 mg | ORAL_TABLET | Freq: Once | ORAL | Status: AC | PRN
  Administered 2023-10-27: 5 mg via ORAL

## 2023-10-27 MED ORDER — LACTATED RINGERS IV SOLN: INTRAVENOUS | Status: DC

## 2023-10-27 MED ORDER — DROPERIDOL 2.5 MG/ML IJ SOLN
INTRAMUSCULAR | Status: AC
  Filled 2023-10-27: qty 2

## 2023-10-27 MED ORDER — OXYCODONE HCL 5 MG/5ML PO SOLN: 5.0000 mg | Freq: Once | ORAL | Status: AC | PRN

## 2023-10-27 MED ORDER — CEFAZOLIN SODIUM-DEXTROSE 2-4 GM/100ML-% IV SOLN
INTRAVENOUS | Status: AC
  Filled 2023-10-27: qty 100

## 2023-10-27 MED ORDER — CEFAZOLIN SODIUM-DEXTROSE 2-4 GM/100ML-% IV SOLN
2.0000 g | INTRAVENOUS | Status: AC
  Administered 2023-10-27: 2 g via INTRAVENOUS

## 2023-10-27 MED ORDER — SCOPOLAMINE 1 MG/3DAYS TD PT72
1.0000 | MEDICATED_PATCH | TRANSDERMAL | Status: DC
  Administered 2023-10-27: 1 via TRANSDERMAL

## 2023-10-27 MED ORDER — ACETAMINOPHEN 10 MG/ML IV SOLN
INTRAVENOUS | Status: DC | PRN
  Administered 2023-10-27: 1000 mg via INTRAVENOUS

## 2023-10-27 MED ORDER — FENTANYL CITRATE (PF) 100 MCG/2ML IJ SOLN
INTRAMUSCULAR | Status: AC
  Filled 2023-10-27: qty 2

## 2023-10-27 MED ORDER — PHENYLEPHRINE 80 MCG/ML (10ML) SYRINGE FOR IV PUSH (FOR BLOOD PRESSURE SUPPORT)
PREFILLED_SYRINGE | INTRAVENOUS | Status: AC
  Filled 2023-10-27: qty 10

## 2023-10-27 MED ORDER — HYDROMORPHONE HCL 1 MG/ML IJ SOLN: 0.2500 mg | INTRAMUSCULAR | Status: DC | PRN

## 2023-10-27 MED ORDER — OXYCODONE HCL 5 MG PO TABS
ORAL_TABLET | ORAL | Status: AC
  Filled 2023-10-27: qty 1

## 2023-10-27 MED ORDER — ONDANSETRON HCL 4 MG/2ML IJ SOLN
INTRAMUSCULAR | Status: AC
  Filled 2023-10-27: qty 2

## 2023-10-27 MED ORDER — DROPERIDOL 2.5 MG/ML IJ SOLN
0.6250 mg | Freq: Once | INTRAMUSCULAR | Status: AC | PRN
  Administered 2023-10-27: 0.625 mg via INTRAVENOUS

## 2023-10-27 MED ORDER — ACETAMINOPHEN 10 MG/ML IV SOLN: 1000.0000 mg | Freq: Once | INTRAVENOUS | Status: DC | PRN

## 2023-10-27 MED ORDER — PROPOFOL 10 MG/ML IV BOLUS
INTRAVENOUS | Status: DC | PRN
  Administered 2023-10-27: 140 mg via INTRAVENOUS

## 2023-10-27 MED ORDER — ROCURONIUM BROMIDE 10 MG/ML (PF) SYRINGE
PREFILLED_SYRINGE | INTRAVENOUS | Status: AC
  Filled 2023-10-27: qty 10

## 2023-10-27 MED ORDER — FENTANYL CITRATE (PF) 100 MCG/2ML IJ SOLN
INTRAMUSCULAR | Status: DC | PRN
  Administered 2023-10-27: 50 ug via INTRAVENOUS
  Administered 2023-10-27: 100 ug via INTRAVENOUS

## 2023-10-27 MED ORDER — SUGAMMADEX SODIUM 200 MG/2ML IV SOLN
INTRAVENOUS | Status: DC | PRN
  Administered 2023-10-27: 200 mg via INTRAVENOUS

## 2023-10-27 MED ORDER — FENTANYL CITRATE (PF) 100 MCG/2ML IJ SOLN
25.0000 ug | INTRAMUSCULAR | Status: DC | PRN
  Administered 2023-10-27: 50 ug via INTRAVENOUS

## 2023-10-27 MED ORDER — ATROPINE SULFATE 0.4 MG/ML IV SOLN
INTRAVENOUS | Status: AC
Start: 2023-10-27 — End: 2023-10-27
  Filled 2023-10-27: qty 1

## 2023-10-27 MED ORDER — DEXAMETHASONE SODIUM PHOSPHATE 10 MG/ML IJ SOLN
INTRAMUSCULAR | Status: AC
Start: 2023-10-27 — End: 2023-10-27
  Filled 2023-10-27: qty 1

## 2023-10-27 MED ORDER — MIDAZOLAM HCL 2 MG/2ML IJ SOLN
INTRAMUSCULAR | Status: AC
  Filled 2023-10-27: qty 2

## 2023-10-27 MED ORDER — EPHEDRINE SULFATE (PRESSORS) 50 MG/ML IJ SOLN
INTRAMUSCULAR | Status: DC | PRN
  Administered 2023-10-27: 5 mg via INTRAVENOUS
  Administered 2023-10-27: 10 mg via INTRAVENOUS

## 2023-10-27 MED ORDER — DEXAMETHASONE SODIUM PHOSPHATE 4 MG/ML IJ SOLN
INTRAMUSCULAR | Status: DC | PRN
  Administered 2023-10-27: 5 mg via INTRAVENOUS

## 2023-10-27 MED ORDER — LIDOCAINE 2% (20 MG/ML) 5 ML SYRINGE
INTRAMUSCULAR | Status: AC
Start: 2023-10-27 — End: 2023-10-27
  Filled 2023-10-27: qty 5

## 2023-10-27 MED ORDER — MEPERIDINE HCL 25 MG/ML IJ SOLN: 6.2500 mg | INTRAMUSCULAR | Status: DC | PRN

## 2023-10-27 MED ORDER — PHENYLEPHRINE HCL (PRESSORS) 10 MG/ML IV SOLN
INTRAVENOUS | Status: DC | PRN
Start: 2023-10-27 — End: 2023-10-27
  Administered 2023-10-27 (×4): 80 ug via INTRAVENOUS

## 2023-10-27 SURGICAL SUPPLY — 53 items
BINDER ABDOMINAL 12 ML 46-62 (SOFTGOODS) IMPLANT
BINDER ABDOMINAL 12 SM 30-45 (SOFTGOODS) IMPLANT
BIOPATCH RED 1 DISK 7.0 (GAUZE/BANDAGES/DRESSINGS) ×2 IMPLANT
BLADE CLIPPER SURG (BLADE) IMPLANT
BLADE SURG 10 STRL SS (BLADE) ×4 IMPLANT
BLADE SURG 11 STRL SS (BLADE) IMPLANT
BLADE SURG 15 STRL LF DISP TIS (BLADE) ×1 IMPLANT
CLIP APPLIE 9.375 MED OPEN (MISCELLANEOUS) IMPLANT
DERMABOND ADVANCED .7 DNX12 (GAUZE/BANDAGES/DRESSINGS) ×2 IMPLANT
DRAIN CHANNEL 19F RND (DRAIN) ×2 IMPLANT
DRAPE UTILITY XL STRL (DRAPES) ×1 IMPLANT
DRSG TEGADERM 4X4.75 (GAUZE/BANDAGES/DRESSINGS) ×2 IMPLANT
ELECTRODE BLDE 4.0 EZ CLN MEGD (MISCELLANEOUS) ×1 IMPLANT
ELECTRODE REM PT RTRN 9FT ADLT (ELECTROSURGICAL) ×2 IMPLANT
EVACUATOR SILICONE 100CC (DRAIN) ×2 IMPLANT
GAUZE PAD ABD 8X10 STRL (GAUZE/BANDAGES/DRESSINGS) ×2 IMPLANT
GAUZE SPONGE 2X2 STRL 8-PLY (GAUZE/BANDAGES/DRESSINGS) ×2 IMPLANT
GLOVE BIO SURGEON STRL SZ 6.5 (GLOVE) IMPLANT
GLOVE BIO SURGEON STRL SZ7.5 (GLOVE) IMPLANT
GLOVE BIO SURGEON STRL SZ8 (GLOVE) ×1 IMPLANT
GLOVE BIOGEL PI IND STRL 7.0 (GLOVE) IMPLANT
GLOVE BIOGEL PI IND STRL 8 (GLOVE) ×1 IMPLANT
GOWN STRL REUS W/ TWL LRG LVL3 (GOWN DISPOSABLE) ×1 IMPLANT
GOWN STRL REUS W/TWL XL LVL3 (GOWN DISPOSABLE) ×1 IMPLANT
HEMOSTAT ARISTA ABSORB 3G PWDR (HEMOSTASIS) IMPLANT
HIBICLENS CHG 4% 4OZ BTL (MISCELLANEOUS) ×1 IMPLANT
MANIFOLD NEPTUNE II (INSTRUMENTS) ×1 IMPLANT
NDL HYPO 22X1.5 SAFETY MO (MISCELLANEOUS) ×2 IMPLANT
NEEDLE HYPO 22X1.5 SAFETY MO (MISCELLANEOUS) ×2 IMPLANT
NS IRRIG 1000ML POUR BTL (IV SOLUTION) ×1 IMPLANT
PACK BASIN DAY SURGERY FS (CUSTOM PROCEDURE TRAY) ×1 IMPLANT
PACK UNIVERSAL I (CUSTOM PROCEDURE TRAY) ×1 IMPLANT
PENCIL SMOKE EVACUATOR (MISCELLANEOUS) ×2 IMPLANT
PIN SAFETY STERILE (MISCELLANEOUS) ×1 IMPLANT
SLEEVE SCD COMPRESS KNEE MED (STOCKING) ×1 IMPLANT
SPONGE T-LAP 18X18 ~~LOC~~+RFID (SPONGE) ×2 IMPLANT
STAPLER INSORB 30 2030 C-SECTI (MISCELLANEOUS) IMPLANT
STAPLER SKIN PROX WIDE 3.9 (STAPLE) ×1 IMPLANT
SUT MNCRL AB 3-0 PS2 27 (SUTURE) ×4 IMPLANT
SUT MNCRL AB 4-0 PS2 18 (SUTURE) ×2 IMPLANT
SUT SILK 2 0 PERMA HAND 18 BK (SUTURE) ×2 IMPLANT
SUT VIC AB 2-0 CT1 TAPERPNT 27 (SUTURE) ×4 IMPLANT
SUT VIC AB 3-0 PS1 18XBRD (SUTURE) IMPLANT
SUT VIC AB 3-0 SH 27X BRD (SUTURE) ×1 IMPLANT
SUT VICRYL AB 3 0 TIES (SUTURE) IMPLANT
SYR 20ML LL LF (SYRINGE) ×2 IMPLANT
SYR BULB IRRIG 60ML STRL (SYRINGE) IMPLANT
SYR CONTROL 10ML LL (SYRINGE) ×1 IMPLANT
TOWEL GREEN STERILE FF (TOWEL DISPOSABLE) ×2 IMPLANT
TRAY DSU PREP LF (CUSTOM PROCEDURE TRAY) ×1 IMPLANT
TUBE CONNECTING 20X1/4 (TUBING) ×1 IMPLANT
UNDERPAD 30X36 HEAVY ABSORB (UNDERPADS AND DIAPERS) ×2 IMPLANT
YANKAUER SUCT BULB TIP NO VENT (SUCTIONS) ×1 IMPLANT

## 2023-10-27 NOTE — Anesthesia Postprocedure Evaluation (Signed)
 Anesthesia Post Note  Patient: Maria Johnson  Procedure(s) Performed: PANNICULECTOMY (Abdomen)     Patient location during evaluation: PACU Anesthesia Type: General Level of consciousness: awake and alert Pain management: pain level controlled Vital Signs Assessment: post-procedure vital signs reviewed and stable Respiratory status: spontaneous breathing, nonlabored ventilation, respiratory function stable and patient connected to nasal cannula oxygen Cardiovascular status: blood pressure returned to baseline and stable Postop Assessment: no apparent nausea or vomiting Anesthetic complications: no   No notable events documented.  Last Vitals:  Vitals:   10/27/23 1445 10/27/23 1500  BP: 128/81 127/83  Pulse: (!) 103 100  Resp: 16 16  Temp:    SpO2: 100% 100%    Last Pain:  Vitals:   10/27/23 1500  PainSc: Asleep                 Franky JONETTA Bald

## 2023-10-27 NOTE — Interval H&P Note (Signed)
 History and Physical Interval Note: No change in exam or indication for surgery All questions answered Marked for a panniculectomy with her assistance and concurrence Will proceed at her request  10/27/2023 11:04 AM  Maria Johnson  has presented today for surgery, with the diagnosis of m79.3.  The various methods of treatment have been discussed with the patient and family. After consideration of risks, benefits and other options for treatment, the patient has consented to  Procedure(s): PANNICULECTOMY (N/A) as a surgical intervention.  The patient's history has been reviewed, patient examined, no change in status, stable for surgery.  I have reviewed the patient's chart and labs.  Questions were answered to the patient's satisfaction.     Leonce KATHEE [place]

## 2023-10-27 NOTE — Anesthesia Preprocedure Evaluation (Addendum)
 Anesthesia Evaluation  Patient identified by MRN, date of birth, ID band Patient awake    Reviewed: Allergy  & Precautions, NPO status , Patient's Chart, lab work & pertinent test results  Airway Mallampati: I  TM Distance: >3 FB Neck ROM: Full    Dental  (+) Teeth Intact, Dental Advisory Given   Pulmonary sleep apnea and Continuous Positive Airway Pressure Ventilation    breath sounds clear to auscultation       Cardiovascular  Rhythm:Regular Rate:Normal     Neuro/Psych  Headaches PSYCHIATRIC DISORDERS Anxiety Depression     Neuromuscular disease    GI/Hepatic negative GI ROS, Neg liver ROS,,,  Endo/Other  diabetes    Renal/GU negative Renal ROS     Musculoskeletal negative musculoskeletal ROS (+)    Abdominal   Peds  Hematology  (+) Blood dyscrasia, Sickle cell trait and anemia   Anesthesia Other Findings   Reproductive/Obstetrics                              Anesthesia Physical Anesthesia Plan  ASA: 2  Anesthesia Plan: General   Post-op Pain Management: Tylenol  PO (pre-op)* and Toradol  IV (intra-op)*   Induction: Intravenous  PONV Risk Score and Plan: 4 or greater and Ondansetron , Dexamethasone , Midazolam  and Scopolamine  patch - Pre-op  Airway Management Planned: Oral ETT  Additional Equipment: None  Intra-op Plan:   Post-operative Plan: Extubation in OR  Informed Consent: I have reviewed the patients History and Physical, chart, labs and discussed the procedure including the risks, benefits and alternatives for the proposed anesthesia with the patient or authorized representative who has indicated his/her understanding and acceptance.     Dental advisory given  Plan Discussed with: CRNA  Anesthesia Plan Comments:          Anesthesia Quick Evaluation

## 2023-10-27 NOTE — Anesthesia Procedure Notes (Signed)
 Procedure Name: Intubation Date/Time: 10/27/2023 11:33 AM  Performed by: Emilio Rock BIRCH, CRNAPre-anesthesia Checklist: Patient identified, Emergency Drugs available, Suction available and Patient being monitored Patient Re-evaluated:Patient Re-evaluated prior to induction Oxygen Delivery Method: Circle system utilized Preoxygenation: Pre-oxygenation with 100% oxygen Induction Type: IV induction Ventilation: Mask ventilation without difficulty Laryngoscope Size: Mac and 3 Grade View: Grade II Tube type: Oral Number of attempts: 1 Airway Equipment and Method: Stylet Placement Confirmation: ETT inserted through vocal cords under direct vision, positive ETCO2 and breath sounds checked- equal and bilateral Secured at: 22 cm Tube secured with: Tape Dental Injury: Teeth and Oropharynx as per pre-operative assessment

## 2023-10-27 NOTE — Transfer of Care (Signed)
 Immediate Anesthesia Transfer of Care Note  Patient: Maria Johnson  Procedure(s) Performed: PANNICULECTOMY (Abdomen)  Patient Location: PACU  Anesthesia Type:General  Level of Consciousness: sedated  Airway & Oxygen Therapy: Patient Spontanous Breathing and Patient connected to face mask oxygen  Post-op Assessment: Report given to RN and Post -op Vital signs reviewed and stable  Post vital signs: Reviewed and stable  Last Vitals:  Vitals Value Taken Time  BP    Temp    Pulse    Resp 19 10/27/23 14:19  SpO2    Vitals shown include unfiled device data.  Last Pain:  Vitals:   10/27/23 1012  PainSc: 0-No pain         Complications: No notable events documented.

## 2023-10-27 NOTE — Discharge Instructions (Addendum)
 Post Anesthesia Home Care Instructions  Activity: Get plenty of rest for the remainder of the day. A responsible individual must stay with you for 24 hours following the procedure.  For the next 24 hours, DO NOT: -Drive a car -Advertising copywriter -Drink alcoholic beverages -Take any medication unless instructed by your physician -Make any legal decisions or sign important papers.  Meals: Start with liquid foods such as gelatin or soup. Progress to regular foods as tolerated. Avoid greasy, spicy, heavy foods. If nausea and/or vomiting occur, drink only clear liquids until the nausea and/or vomiting subsides. Call your physician if vomiting continues.  Special Instructions/Symptoms: Your throat may feel dry or sore from the anesthesia or the breathing tube placed in your throat during surgery. If this causes discomfort, gargle with warm salt water. The discomfort should disappear within 24 hours.  If you had a scopolamine  patch placed behind your ear for the management of post- operative nausea and/or vomiting:  1. The medication in the patch is effective for 72 hours, after which it should be removed.  Wrap patch in a tissue and discard in the trash. Wash hands thoroughly with soap and water. 2. You may remove the patch earlier than 72 hours if you experience unpleasant side effects which may include dry mouth, dizziness or visual disturbances. 3. Avoid touching the patch. Wash your hands with soap and water after contact with the patch.     Information for Discharge Teaching: EXPAREL  (bupivacaine  liposome injectable suspension)   Pain relief is important to your recovery. The goal is to control your pain so you can move easier and return to your normal activities as soon as possible after your procedure. Your physician may use several types of medicines to manage pain, swelling, and more.  Your surgeon or anesthesiologist gave you EXPAREL (bupivacaine ) to help control your pain after  surgery.  EXPAREL  is a local anesthetic designed to release slowly over an extended period of time to provide pain relief by numbing the tissue around the surgical site. EXPAREL  is designed to release pain medication over time and can control pain for up to 72 hours. Depending on how you respond to EXPAREL , you may require less pain medication during your recovery. EXPAREL  can help reduce or eliminate the need for opioids during the first few days after surgery when pain relief is needed the most. EXPAREL  is not an opioid and is not addictive. It does not cause sleepiness or sedation.   Important! A teal colored band has been placed on your arm with the date, time and amount of EXPAREL  you have received. Please leave this armband in place for the full 96 hours following administration, and then you may remove the band. If you return to the hospital for any reason within 96 hours following the administration of EXPAREL , the armband provides important information that your health care providers to know, and alerts them that you have received this anesthetic.    Possible side effects of EXPAREL : Temporary loss of sensation or ability to move in the area where medication was injected. Nausea, vomiting, constipation Rarely, numbness and tingling in your mouth or lips, lightheadedness, or anxiety may occur. Call your doctor right away if you think you may be experiencing any of these sensations, or if you have other questions regarding possible side effects.  Follow all other discharge instructions given to you by your surgeon or nurse. Eat a healthy diet and drink plenty of water or other fluids.  About my Maria Johnson Maria Johnson  Drain  What is a Maria Johnson Maria Johnson? A Maria Johnson is a soft, round device used to collect drainage. It is connected to a long, thin drainage catheter, which is held in place by one or two small stiches near your surgical incision site. When the Maria Johnson is squeezed, it forms a  vacuum, forcing the drainage to empty into the Maria Johnson.  Emptying the Maria Johnson Maria Johnson- To empty the Maria Johnson: 1. Release the plug on the top of the Maria Johnson. 2. Pour the Maria Johnson's contents into a measuring container which your nurse will provide. 3. Record the time emptied and amount of drainage. Empty the drain(s) as often as your     doctor or nurse recommends.  Date                  Time                    Amount (Drain 1)                 Amount (Drain 2)  _____________________________________________________________________  _____________________________________________________________________  _____________________________________________________________________  _____________________________________________________________________  _____________________________________________________________________  _____________________________________________________________________  _____________________________________________________________________  _____________________________________________________________________  Squeezing the Maria Johnson Maria Johnson- To squeeze the Maria Johnson: 1. Make sure the plug at the top of the Maria Johnson is open. 2. Squeeze the Maria Johnson tightly in your fist. You will hear air squeezing from the Maria Johnson. 3. Replace the plug while the Maria Johnson is squeezed. 4. Use a safety pin to attach the Maria Johnson to your clothing. This will keep the catheter from     pulling at the Maria Johnson insertion site.  When to call your doctor- Call your doctor if: Drain site becomes red, swollen or hot. You have a fever greater than 101 degrees F. There is oozing at the drain site. Drain falls out (apply a guaze bandage over the drain hole and secure it with tape). Drainage increases daily not related to activity patterns. (You will usually have more drainage when you are active than when you are resting.) Drainage has a bad odor.   No tylenol  until after 640pm  Please wear binder at all times other than when washing  the garment for showering May shower in 24 hours Record drain output Do not submerge the incisions in water, no heavy lifting greater than 20 pounds, no vigorous activity. Please walk hourly Call the office for any questions or concerns  [phone]

## 2023-10-27 NOTE — Op Note (Signed)
 DATE OF OPERATION: 10/27/2023  LOCATION: [facility] operating Room  PREOPERATIVE DIAGNOSIS: Panniculitis  POSTOPERATIVE DIAGNOSIS: Same  PROCEDURE: Panniculectomy  SURGEON: Marinell Birmingham, MD  ASSISTANT: Estefana Peck  EBL: 75 cc  CONDITION: Stable  COMPLICATIONS: None  INDICATION: The patient, Maria Johnson, is a 43 y.o. female born on Jan 04, 1981, is here for treatment of ongoing rashes on the posterior aspect of the pannus and in the intertriginous regions.   PROCEDURE DETAILS:  The patient was seen prior to surgery and marked.  The IV antibiotics were given. The patient was taken to the operating room and given a general anesthetic. A standard time out was performed and all information was confirmed by those in the room. SCDs were placed.   The abdomen was prepped and draped in usual sterile manner.  A transverse incision was made across the lower portion of the abdomen sharply and the electrocautery is used to dissect the skin subcutaneous tissues down to the anterior abdominal wall fascia.  Skin and fat was then elevated in a caudad to cranial manner to the level of the umbilicus.  An appropriate point for counterincision was identified and the skin was incised sharply.  The pannus was removed and a photograph taken.  The pannus weighed 1587 g.  Meticulous hemostasis was achieved with electrocautery.  The subfascial space over the rectus muscle and external obliques as well as the subcutaneous tissues were infiltrated with 100 mL of a mixture of Exparel , quarter percent plain Marcaine , saline.  219 French round drains were placed in the surgical bed and brought out through separate stab incisions.  Skin edges were tailor tacked in place with skin clips and Scarpa's fascia approximated with interrupted 2-0 Vicryl sutures.  The dermis was closed with interrupted and running 3-0 Monocryl sutures.  Skin was closed with a running 4-0 Monocryl subcuticular stitch.  The incision was  sealed with Dermabond and the patient was placed in a compressive garment.  She was awakened from anesthesia without incident transferred to the recovery room in good condition.  All instrument needle and sponge counts were reported as correct and there were no complications noted during the procedure. The patient was allowed to wake up and taken to recovery room in stable condition at the end of the case. The family was notified at the end of the case.   The advanced practice practitioner (APP) assisted throughout the case.  The APP was essential in retraction and counter traction when needed to make the case progress smoothly.  This retraction and assistance made it possible to see the tissue plans for the procedure.  The assistance was needed for blood control, tissue re-approximation and assisted with closure of the incision site.

## 2023-10-28 ENCOUNTER — Encounter (HOSPITAL_BASED_OUTPATIENT_CLINIC_OR_DEPARTMENT_OTHER): Payer: Self-pay | Admitting: Plastic Surgery

## 2023-11-02 ENCOUNTER — Encounter: Payer: Self-pay | Admitting: Plastic Surgery

## 2023-11-02 ENCOUNTER — Ambulatory Visit: Admitting: Plastic Surgery

## 2023-11-02 VITALS — BP 102/67 | HR 89 | Ht 69.0 in | Wt 189.2 lb

## 2023-11-02 DIAGNOSIS — Z9889 Other specified postprocedural states: Secondary | ICD-10-CM

## 2023-11-02 NOTE — Progress Notes (Signed)
 Ms. Shryock returns today 5 days postop from a panniculectomy.  She is doing very well and only using Tylenol  and Motrin for pain control.  She is moving well.  No fevers, tolerating diet.  Drain output remains greater than 30 mL from both drains.  On examination the incisions are clean dry and intact without erythema.  Her early result is quite nice.  Dressings were changed and she was given instructions to call if her drain output falls below 1 ounce or 30 mL per 24 hours.  Otherwise she will follow-up on the 15th.

## 2023-11-03 ENCOUNTER — Other Ambulatory Visit (HOSPITAL_BASED_OUTPATIENT_CLINIC_OR_DEPARTMENT_OTHER): Payer: Self-pay

## 2023-11-03 ENCOUNTER — Ambulatory Visit (HOSPITAL_BASED_OUTPATIENT_CLINIC_OR_DEPARTMENT_OTHER)
Admission: RE | Admit: 2023-11-03 | Discharge: 2023-11-03 | Disposition: A | Discharge status: Home / Self Care | Source: Ambulatory Visit | Attending: Family Medicine | Admitting: Family Medicine

## 2023-11-03 DIAGNOSIS — J3489 Other specified disorders of nose and nasal sinuses: Secondary | ICD-10-CM | POA: Insufficient documentation

## 2023-11-03 DIAGNOSIS — R519 Headache, unspecified: Secondary | ICD-10-CM | POA: Diagnosis present

## 2023-11-03 MED ORDER — FLUCONAZOLE 200 MG PO TABS
200.0000 mg | ORAL_TABLET | ORAL | 1 refills | Status: AC
  Filled 2023-11-03 – 2023-11-06 (×2): qty 8, 28d supply, fill #0
  Filled 2023-11-29: qty 8, 28d supply, fill #1

## 2023-11-06 ENCOUNTER — Other Ambulatory Visit (HOSPITAL_BASED_OUTPATIENT_CLINIC_OR_DEPARTMENT_OTHER): Payer: Self-pay

## 2023-11-09 ENCOUNTER — Ambulatory Visit (INDEPENDENT_AMBULATORY_CARE_PROVIDER_SITE_OTHER): Admitting: Physician Assistant

## 2023-11-09 VITALS — BP 116/77 | HR 83

## 2023-11-09 DIAGNOSIS — Z9889 Other specified postprocedural states: Secondary | ICD-10-CM

## 2023-11-09 NOTE — Progress Notes (Signed)
 Patient is a pleasant 43 year old female s/p panniculectomy performed 10/27/2023 by Dr. Waddell who presents to clinic for postoperative follow-up.  She was last seen here in clinic on 11/02/2023.  At that time, she was doing well from a postoperative standpoint and her exam was overall benign.  However, volume output remained elevated from each drain.  Plan is for her to return once either of her drain output demonstrates persistently reduced volume.  Today, patient is doing excellent from a postoperative standpoint.  She states that she had to discontinue the oxycodone  postoperatively due to itching, but her pain has been well-controlled on Tylenol  alone.  She is ambulatory, tolerating p.o. intake without difficulty.  Voiding urine and BM.  Denies any chest pain, leg swelling, difficulty breathing, fevers, or other concerns.  She reports that right drain output remains 30 to 40 cc/day, but left drain output has been negligible, less than 10 cc/day consistently.  On exam, abdomen is soft and nondistended.  No TTP.  No redness or overlying skin changes.  Incision CDI throughout.  Drains are intact and functional with normal appearing serosanguineous output.  No objective leg swelling.  Left drain removed without complication or difficulty, well-tolerated by patient.  Bordered Mepilex dressing placed.  Follow-up in 1 week as scheduled for consideration of right drain removal.  She understands that volume output needs to be below 30 cc consistently for 3 consecutive days.  Will obtain photos at subsequent encounter.  She can call the office if she having questions or concerns in interim.

## 2023-11-15 ENCOUNTER — Ambulatory Visit (INDEPENDENT_AMBULATORY_CARE_PROVIDER_SITE_OTHER): Admitting: Student

## 2023-11-15 ENCOUNTER — Ambulatory Visit: Admitting: Family Medicine

## 2023-11-15 VITALS — BP 107/73 | HR 88

## 2023-11-15 DIAGNOSIS — H9201 Otalgia, right ear: Secondary | ICD-10-CM

## 2023-11-15 DIAGNOSIS — Z9889 Other specified postprocedural states: Secondary | ICD-10-CM

## 2023-11-15 NOTE — Progress Notes (Signed)
 Patient is a 43 year old female who underwent panniculectomy with Dr. Waddell on 10/27/2023.  Patient is almost 3 weeks postop.  She presents to the clinic today for postoperative follow-up.  Patient was last seen in the clinic on 11/09/2023.  At this visit,  patient was doing well from a postoperative standpoint.  She reported that her right drain output was 30 to 40 cc/day, but the left drain output was less than 10 cc/day consistently.  On exam, abdomen was soft and nondistended.  There is no redness or overlying skin changes.  Incisions were clean dry and intact throughout.  Left drain was removed.  Today, patient reports she is doing well.  She reports that her drain output has been 15 to 20 cc for the past 3 days.  She denies any fevers or chills.  She denies any pain.  She denies any issues or concerns at this time.  Chaperone present on exam.  On exam, patient sitting upright in no acute distress.  Abdomen is soft and nontender.  There is no overlying erythema, no obvious fluid collections on exam.  Incision appears to be intact and healing well.  Right JP drain is in place with minimal dark serous drainage in the bulb.  There are no signs of infection on exam.  Right JP drain was removed without any difficulty.  Patient tolerated well.  Recommended that patient apply Vaseline to her incision daily and gently massage her incision.  Discussed with her that she should apply gauze and tape over her right drain site daily for the next few days, and then she may transition to Vaseline over the drain site.  Patient expressed understanding.  Discussed with the patient to continue with compression and avoid strenuous activities.  Patient to follow-up at her neck scheduled appointment in 2 weeks.  Instructed her to call if she has any questions or concerns about anything.

## 2023-11-21 ENCOUNTER — Ambulatory Visit (INDEPENDENT_AMBULATORY_CARE_PROVIDER_SITE_OTHER)

## 2023-11-21 DIAGNOSIS — Z23 Encounter for immunization: Secondary | ICD-10-CM | POA: Diagnosis not present

## 2023-11-22 ENCOUNTER — Ambulatory Visit: Payer: Self-pay

## 2023-11-22 DIAGNOSIS — J309 Allergic rhinitis, unspecified: Secondary | ICD-10-CM

## 2023-11-28 NOTE — Progress Notes (Unsigned)
 Patient is a 43 year old female who underwent panniculectomy with Dr. Waddell on 10/27/2023.  Patient is about 4-1/2 weeks postop.  She presents to the clinic today for postoperative follow-up.  Patient was last seen in the clinic on 11/15/2023.  At this visit, patient was doing well.  On exam, abdomen was soft and nontender.  There is no obvious fluid collection on exam.  Incision was intact and healing well.  Right JP drain was in place with minimal dark serous drainage in the bulb.  Drain was removed without difficulty.  Recommended that patient apply Vaseline to her incision daily.  Today,

## 2023-11-29 ENCOUNTER — Ambulatory Visit (INDEPENDENT_AMBULATORY_CARE_PROVIDER_SITE_OTHER): Admitting: Student

## 2023-11-29 ENCOUNTER — Encounter: Payer: Self-pay | Admitting: Student

## 2023-11-29 VITALS — BP 132/83 | HR 81 | Ht 69.0 in | Wt 179.8 lb

## 2023-11-29 DIAGNOSIS — Z9889 Other specified postprocedural states: Secondary | ICD-10-CM

## 2023-12-02 ENCOUNTER — Other Ambulatory Visit: Payer: Self-pay | Admitting: Family Medicine

## 2023-12-02 DIAGNOSIS — H9201 Otalgia, right ear: Secondary | ICD-10-CM

## 2023-12-03 ENCOUNTER — Other Ambulatory Visit: Payer: Self-pay

## 2023-12-04 ENCOUNTER — Other Ambulatory Visit (HOSPITAL_BASED_OUTPATIENT_CLINIC_OR_DEPARTMENT_OTHER): Payer: Self-pay

## 2023-12-04 MED ORDER — FLUTICASONE PROPIONATE 50 MCG/ACT NA SUSP
2.0000 | Freq: Every day | NASAL | 5 refills | Status: AC
  Filled 2023-12-04: qty 16, 30d supply, fill #0
  Filled 2024-01-05: qty 16, 30d supply, fill #1
  Filled 2024-02-03: qty 16, 30d supply, fill #2
  Filled 2024-03-08: qty 16, 30d supply, fill #3

## 2023-12-12 ENCOUNTER — Telehealth: Admitting: Physician Assistant

## 2023-12-12 DIAGNOSIS — K047 Periapical abscess without sinus: Secondary | ICD-10-CM | POA: Diagnosis not present

## 2023-12-12 MED ORDER — AMOXICILLIN-POT CLAVULANATE 875-125 MG PO TABS: 1.0000 | ORAL_TABLET | Freq: Two times a day (BID) | ORAL | 0 refills | Status: DC

## 2023-12-12 MED ORDER — CHLORHEXIDINE GLUCONATE 0.12 % MT SOLN: 15.0000 mL | Freq: Two times a day (BID) | OROMUCOSAL | 0 refills | Status: DC

## 2023-12-12 MED ORDER — NAPROXEN 500 MG PO TABS: 500.0000 mg | ORAL_TABLET | Freq: Two times a day (BID) | ORAL | 0 refills | Status: DC

## 2023-12-12 NOTE — Patient Instructions (Signed)
 Earlena CHRISTELLA Barefoot, thank you for joining [name], PA-C for today's virtual visit.  While this provider is not your primary care provider (PCP), if your PCP is located in our provider database this encounter information will be shared with them immediately following your visit.   A [facility] MyChart account gives you access to today's visit and all your visits, tests, and labs performed at [facility]  click here if you don't have a [facility] MyChart account or go to [email or web address]  Consent: (Patient) Raynisha CHRISTELLA Barefoot provided verbal consent for this virtual visit at the beginning of the encounter.  Current Medications:  Current Outpatient Medications:    acetaminophen  (TYLENOL ) 500 MG tablet, Take 500 mg by mouth every 6 (six) hours as needed., Disp: , Rfl:    ALPRAZolam  (XANAX ) 0.5 MG tablet, Take 1 tablet (0.5 mg total) by mouth daily as needed for anxiety., Disp: 60 tablet, Rfl: 2   atorvastatin  (LIPITOR) 40 MG tablet, Take 1 tablet (40 mg total) by mouth daily., Disp: 100 tablet, Rfl: 3   citalopram  (CELEXA ) 20 MG tablet, Take 1 tablet (20 mg total) by mouth daily., Disp: 30 tablet, Rfl: 3   clobetasol  (TEMOVATE ) 0.05 % external solution, APPLY TO AFFECTED AREA OF SCALP TWICE DAILY X 2 WEEKS. TAKE 1-2 WEEKS OFF AND REPEAT AS NEEDED, Disp: , Rfl:    EPINEPHrine  0.3 mg/0.3 mL IJ SOAJ injection, Inject 0.3 mg into the muscle as needed for anaphylaxis., Disp: 2 each, Rfl: 3   famotidine  (PEPCID ) 40 MG tablet, Take 1 tablet (40 mg total) by mouth daily., Disp: 90 tablet, Rfl: 1   fluconazole  (DIFLUCAN ) 200 MG tablet, Take 200 mg by mouth as needed., Disp: , Rfl:    fluconazole  (DIFLUCAN ) 200 MG tablet, Take 1 tablet (200 mg total) by mouth 2 (two) times a week., Disp: 8 tablet, Rfl: 1   fluticasone  (FLONASE ) 50 MCG/ACT nasal spray, Place 2 sprays into both nostrils daily., Disp: 16 g, Rfl: 5   hydrocortisone  2.5 % cream, Apply topically 2 (two) times daily.  (Patient not taking: Reported on 11/15/2023), Disp: 30 g, Rfl: 5   ketorolac  (TORADOL ) 10 MG tablet, Take 1 tablet (10 mg total) by mouth every 6 (six) hours as needed for moderate pain (pain score 4-6)., Disp: 20 tablet, Rfl: 0   Multiple Vitamins-Minerals (MULTIVITAMIN WITH MINERALS) tablet, Take 1 tablet by mouth daily., Disp: , Rfl:    Rimegepant Sulfate  (NURTEC) 75 MG TBDP, Dissolve 1 tablet (75 mg total) on the tongue daily as needed for headaches. Repeat in 2 hours if needed., Disp: 16 tablet, Rfl: 2   Safety Seal Miscellaneous MISC, Apply 1 application  topically every morning. Medication Name: Hormonic Hair, Disp: 30 mL, Rfl: 6   tirzepatide  (MOUNJARO ) 15 MG/0.5ML Pen, Inject 15 mg into the skin once a week., Disp: 6 mL, Rfl: 2   Medications ordered in this encounter:  No orders of the defined types were placed in this encounter.    *If you need refills on other medications prior to your next appointment, please contact your pharmacy*  Follow-Up: Call back or seek an in-person evaluation if the symptoms worsen or if the condition fails to improve as anticipated.  [facility] [phone]  Other Instructions Dental Abscess  A dental abscess is an area of pus in or around a tooth. It comes from an infection. It can cause pain and other symptoms. Treatment will help with symptoms and prevent the infection from  spreading. What are the causes? This condition is caused by an infection in or around the tooth. This can be from: Very bad tooth decay (cavities). A bad injury to the tooth, such as a broken or chipped tooth. What increases the risk? The risk to get an abscess is higher in males. It is also more likely in people who: Have dental decay. Have very bad gum disease. Eat sugary snacks between meals. Use tobacco. Have diabetes. Have a weak disease-fighting system (immune system). Do not brush their teeth regularly. What are the signs or symptoms? Some mild  symptoms are: Tenderness. Bad breath. Fever. A sharp, sour taste in the mouth. Pain in and around the infected tooth. Worse symptoms of this condition include: Swollen neck glands. Chills. Pus draining around the tooth. Swelling and redness around the tooth, the mouth, or the face. Very bad pain in and around the tooth. The worst symptoms can include: Difficulty swallowing. Difficulty opening your mouth. Feeling like you may vomit or vomiting. How is this treated? This is treated by getting rid of the infection. Your dentist will discuss ways to do this, including: Antibiotic medicines. Antibacterial mouth rinse. An incision in the abscess to drain out the pus. A root canal. Removing the tooth. Follow these instructions at home: Medicines Take over-the-counter and prescription medicines only as told by your dentist. If you were prescribed an antibiotic medicine, take it as told by your dentist. Do not stop taking it even if you start to feel better. If you were prescribed a gel that has numbing medicine in it, use it exactly as told. Ask your dentist if you should avoid driving or using machines while you are taking your medicine. General instructions Rinse your mouth often with salt water. To make salt water, dissolve -1 tsp (3-6 g) of salt in 1 cup (237 mL) of warm water. Eat a soft diet while your mouth is healing. Drink enough fluid to keep your pee (urine) pale yellow. Do not apply heat to the outside of your mouth. Do not smoke or use any products that contain nicotine or tobacco. If you need help quitting, ask your dentist. Keep all follow-up visits. Prevent an abscess Brush your teeth every morning and every night. Use fluoride toothpaste. Floss your teeth each day. Get dental cleanings as often as told by your dentist. Think about getting dental sealant put on teeth that have deep holes (decay). Drink water that has fluoride in it. Most tap water has  fluoride. Check the label on bottled water to see if it has fluoride in it. Drink water instead of sugary drinks. Eat healthy meals and snacks. Wear a mouth guard or face shield when you play sports. Contact a doctor if: Your pain is worse and medicine does not help. Get help right away if: You have a fever or chills. Your symptoms suddenly get worse. You have a very bad headache. You have problems breathing or swallowing. You have trouble opening your mouth. You have swelling in your neck or close to your eye. These symptoms may be an emergency. Get help right away. Call your local emergency services (911 in the [place] Do not wait to see if the symptoms will go away. Do not drive yourself to the hospital. Summary A dental abscess is an area of pus in or around a tooth. It is caused by an infection. Treatment will help with symptoms and prevent the infection from spreading. Take over-the-counter and prescription medicines only as told by your  dentist. To prevent an abscess, take good care of your teeth. Brush your teeth every morning and night. Use floss every day. Get dental cleanings as often as told by your dentist. This information is not intended to replace advice given to you by your health care provider. Make sure you discuss any questions you have with your health care provider. Document Revised: 03/25/2020 Document Reviewed: 03/26/2020 Elsevier Patient Education  2024 [employer]   If you have been instructed to have an in-person evaluation today at a local Urgent Care facility, please use the link below. It will take you to a list of all of our available [facility], including address, phone number and hours of operation. Please do not delay care.  [facility]  If you or a family member do not have a primary care provider, use the link below to schedule a visit and establish care. When you choose a [facility] primary care physician or advanced  practice provider, you gain a long-term partner in health. Find a Primary Care Provider  Learn more about [facility]'s in-office and virtual care options:  - Get Care Now

## 2023-12-12 NOTE — Progress Notes (Signed)
 Virtual Visit Consent   Maria Johnson, you are scheduled for a virtual visit with a [facility] provider today. Just as with appointments in the office, your consent must be obtained to participate. Your consent will be active for this visit and any virtual visit you may have with one of our providers in the next 365 days. If you have a MyChart account, a copy of this consent can be sent to you electronically.  As this is a virtual visit, video technology does not allow for your provider to perform a traditional examination. This may limit your provider's ability to fully assess your condition. If your provider identifies any concerns that need to be evaluated in person or the need to arrange testing (such as labs, EKG, etc.), we will make arrangements to do so. Although advances in technology are sophisticated, we cannot ensure that it will always work on either your end or our end. If the connection with a video visit is poor, the visit may have to be switched to a telephone visit. With either a video or telephone visit, we are not always able to ensure that we have a secure connection.  By engaging in this virtual visit, you consent to the provision of healthcare and authorize for your insurance to be billed (if applicable) for the services provided during this visit. Depending on your insurance coverage, you may receive a charge related to this service.  I need to obtain your verbal consent now. Are you willing to proceed with your visit today? Maria Johnson has provided verbal consent on 12/12/2023 for a virtual visit (video or telephone). Maria Johnson, [place]  Date: 12/12/2023 4:50 PM   Virtual Visit via Video Note   I, Maria Johnson, connected with  Maria Johnson  ([id number], 01-07-1981) on 12/12/23 at  4:45 PM EST by a video-enabled telemedicine application and verified that I am speaking with the correct person using two identifiers.  Location: Patient: Virtual Visit  Location Patient: Home Provider: Virtual Visit Location Provider: Home Office   I discussed the limitations of evaluation and management by telemedicine and the availability of in person appointments. The patient expressed understanding and agreed to proceed.    History of Present Illness: Maria Johnson is a 43 y.o. who identifies as a female who was assigned female at birth, and is being seen today for pain of her R upper molar starting last night. Notes aching/throbbing pain only mildly alleviated with Anbesol and OTC pain medications. Notes redness and swelling of the tooth now. Has history of abscess in this tooth before about a year ago.  Denies trauma or injury. Has follow-up  with dental provider but cannot see them until next week.   HPI: HPI  Problems:  Patient Active Problem List   Diagnosis Date Noted   Dermatitis venenata 05/08/2023   Perennial allergic rhinitis 08/02/2022   Chronic urticaria 08/02/2022   Panniculitis 10/19/2021   Migraine without status migrainosus, not intractable 08/17/2021   Subacromial bursitis of right shoulder joint 03/29/2021   Elevated serum creatinine 06/17/2020   Other fatigue 06/03/2020   SOBOE (shortness of breath on exertion) 06/03/2020   Diabetes mellitus (HCC) 06/03/2020   Hyperlipidemia associated with type 2 diabetes mellitus (HCC) 06/03/2020   GAD (generalized anxiety disorder), with emotional eating 06/03/2020   At risk for heart disease 06/03/2020   Carpal tunnel syndrome on both sides    Chest pain of uncertain etiology 01/22/2020   OSA (obstructive sleep apnea)  01/22/2020   Insulin -requiring or dependent type II diabetes mellitus (HCC) 01/22/2020   Metabolic syndrome 01/22/2020   Sleep apnea    Sickle cell trait    Depression    Chicken pox    Bronchitis    Hyperlipidemia    Mixed hyperlipidemia 08/23/2019   Hirsutism 08/07/2019   Sprain of medial collateral ligament of left knee 10/30/2018   Bilateral carpal tunnel  syndrome 04/04/2018   Atopic dermatitis 04/04/2018   OSA on CPAP 09/25/2017   Well adult exam 09/25/2017   Vitamin D  deficiency 08/24/2017   Insomnia 08/24/2017   Osteochondritis dissecans of ankle, 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   Pes planus 10/22/2015   Anxiety 01/08/2014   Chest pain 01/08/2014   Generalized anxiety disorder 01/08/2014   Type 2 diabetes mellitus with hyperglycemia (HCC) 08/12/2013   Acne 02/16/2011   Diabetes in pregnancy 12/10/2005    Allergies:  Allergies  Allergen Reactions   Hydrocodone  Anaphylaxis   Tramadol  Anaphylaxis and Hives   Cat Dander    Dust Mite Extract    Molds & Smuts Other (See Comments)    Sneezing, runny eyes and nose   Medications:  Current Outpatient Medications:    amoxicillin -clavulanate (AUGMENTIN ) 875-125 MG tablet, Take 1 tablet by mouth 2 (two) times daily., Disp: 14 tablet, Rfl: 0   chlorhexidine  (PERIDEX ) 0.12 % solution, Use as directed 15 mLs in the mouth or throat 2 (two) times daily., Disp: 120 mL, Rfl: 0   naproxen  (NAPROSYN ) 500 MG tablet, Take 1 tablet (500 mg total) by mouth 2 (two) times daily with a meal., Disp: 20 tablet, Rfl: 0   acetaminophen  (TYLENOL ) 500 MG tablet, Take 500 mg by mouth every 6 (six) hours as needed., Disp: , Rfl:    ALPRAZolam  (XANAX ) 0.5 MG tablet, Take 1 tablet (0.5 mg total) by mouth daily as needed for anxiety., Disp: 60 tablet, Rfl: 2   atorvastatin  (LIPITOR) 40 MG tablet, Take 1 tablet (40 mg total) by mouth daily., Disp: 100 tablet, Rfl: 3   citalopram  (CELEXA ) 20 MG tablet, Take 1 tablet (20 mg total) by mouth daily., Disp: 30 tablet, Rfl: 3   clobetasol  (TEMOVATE ) 0.05 % external solution, APPLY TO AFFECTED AREA OF SCALP TWICE DAILY X 2 WEEKS. TAKE 1-2 WEEKS OFF AND REPEAT AS NEEDED, Disp: , Rfl:    EPINEPHrine  0.3 mg/0.3 mL IJ SOAJ injection, Inject 0.3 mg into the muscle as needed for anaphylaxis., Disp: 2 each, Rfl: 3   famotidine  (PEPCID ) 40 MG tablet, Take 1 tablet (40 mg total) by mouth daily., Disp:  90 tablet, Rfl: 1   fluconazole  (DIFLUCAN ) 200 MG tablet, Take 200 mg by mouth as needed., Disp: , Rfl:    fluconazole  (DIFLUCAN ) 200 MG tablet, Take 1 tablet (200 mg total) by mouth 2 (two) times a week., Disp: 8 tablet, Rfl: 1   fluticasone  (FLONASE ) 50 MCG/ACT nasal spray, Place 2 sprays into both nostrils daily., Disp: 16 g, Rfl: 5   hydrocortisone  2.5 % cream, Apply topically 2 (two) times daily. (Patient not taking: Reported on 11/15/2023), Disp: 30 g, Rfl: 5   Multiple Vitamins-Minerals (MULTIVITAMIN WITH MINERALS) tablet, Take 1 tablet by mouth daily., Disp: , Rfl:    Rimegepant Sulfate  (NURTEC) 75 MG TBDP, Dissolve 1 tablet (75 mg total) on the tongue daily as needed for headaches. Repeat in 2 hours if needed., Disp: 16 tablet, Rfl: 2   Safety Seal Miscellaneous MISC, Apply 1 application  topically every morning. Medication Name: Hormonic Hair, Disp: 30 mL, Rfl:  6   tirzepatide  (MOUNJARO ) 15 MG/0.5ML Pen, Inject 15 mg into the skin once a week., Disp: 6 mL, Rfl: 2  Observations/Objective: Patient is well-developed, well-nourished in no acute distress.  Resting comfortably at home.  Head is normocephalic, atraumatic.  No labored breathing.  Speech is clear and coherent with logical content.  Patient is alert and oriented at baseline.   Assessment and Plan: 1. Dental abscess (Primary) - amoxicillin -clavulanate (AUGMENTIN ) 875-125 MG tablet; Take 1 tablet by mouth 2 (two) times daily.  Dispense: 14 tablet; Refill: 0 - chlorhexidine  (PERIDEX ) 0.12 % solution; Use as directed 15 mLs in the mouth or throat 2 (two) times daily.  Dispense: 120 mL; Refill: 0 - naproxen  (NAPROSYN ) 500 MG tablet; Take 1 tablet (500 mg total) by mouth 2 (two) times daily with a meal.  Dispense: 20 tablet; Refill: 0  Supportive measures and OTC medications reviewed. Start Peridex  mouth rinse, Augmentin  and Naprosyn .  Follow-up with dentist as scheduled.  In-person eval ASAP for any worsening symptoms despite  treatment given.  Follow Up Instructions: I discussed the assessment and treatment plan with the patient. The patient was provided an opportunity to ask questions and all were answered. The patient agreed with the plan and demonstrated an understanding of the instructions.  A copy of instructions were sent to the patient via MyChart unless otherwise noted below.    The patient was advised to call back or seek an in-person evaluation if the symptoms worsen or if the condition fails to improve as anticipated.    Maria Velma Lunger, PA-C

## 2023-12-16 ENCOUNTER — Other Ambulatory Visit: Payer: Self-pay | Admitting: Family Medicine

## 2023-12-18 ENCOUNTER — Encounter: Payer: Self-pay | Admitting: Neurology

## 2023-12-18 ENCOUNTER — Other Ambulatory Visit: Payer: Self-pay

## 2023-12-18 ENCOUNTER — Ambulatory Visit (INDEPENDENT_AMBULATORY_CARE_PROVIDER_SITE_OTHER): Admitting: Neurology

## 2023-12-18 ENCOUNTER — Other Ambulatory Visit (HOSPITAL_BASED_OUTPATIENT_CLINIC_OR_DEPARTMENT_OTHER): Payer: Self-pay

## 2023-12-18 VITALS — BP 109/74 | HR 62 | Ht 69.0 in | Wt 180.5 lb

## 2023-12-18 DIAGNOSIS — F419 Anxiety disorder, unspecified: Secondary | ICD-10-CM

## 2023-12-18 DIAGNOSIS — G43009 Migraine without aura, not intractable, without status migrainosus: Secondary | ICD-10-CM | POA: Diagnosis not present

## 2023-12-18 DIAGNOSIS — Z9189 Other specified personal risk factors, not elsewhere classified: Secondary | ICD-10-CM

## 2023-12-18 DIAGNOSIS — D329 Benign neoplasm of meninges, unspecified: Secondary | ICD-10-CM | POA: Diagnosis not present

## 2023-12-18 MED ORDER — MOUNJARO 15 MG/0.5ML ~~LOC~~ SOAJ
15.0000 mg | SUBCUTANEOUS | 2 refills | Status: AC
  Filled 2023-12-18 – 2023-12-19 (×2): qty 6, 84d supply, fill #0
  Filled 2024-02-09 – 2024-02-12 (×2): qty 6, 84d supply, fill #1

## 2023-12-18 MED ORDER — PROPRANOLOL HCL ER 60 MG PO CP24
60.0000 mg | ORAL_CAPSULE | Freq: Every day | ORAL | 6 refills | Status: AC
  Filled 2023-12-18: qty 30, 30d supply, fill #0
  Filled 2024-01-13: qty 30, 30d supply, fill #1
  Filled 2024-01-27 – 2024-02-05 (×3): qty 30, 30d supply, fill #2

## 2023-12-18 MED ORDER — EPINEPHRINE 0.3 MG/0.3ML IJ SOAJ
0.3000 mg | INTRAMUSCULAR | 3 refills | Status: AC | PRN
  Filled 2023-12-18: qty 2, 30d supply, fill #0
  Filled 2024-02-12: qty 2, 30d supply, fill #1
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
## 2023-12-18 NOTE — Patient Instructions (Addendum)
 Consider Magnesium  Glycinate  Start Propranolol 60 mg nightly  Continue with Nurtec as needed  Please call for updates  Return 6 months or sooner if worse

## 2023-12-19 ENCOUNTER — Other Ambulatory Visit (HOSPITAL_BASED_OUTPATIENT_CLINIC_OR_DEPARTMENT_OTHER): Payer: Self-pay

## 2023-12-25 ENCOUNTER — Ambulatory Visit (INDEPENDENT_AMBULATORY_CARE_PROVIDER_SITE_OTHER): Payer: Self-pay

## 2023-12-25 DIAGNOSIS — J309 Allergic rhinitis, unspecified: Secondary | ICD-10-CM | POA: Diagnosis not present

## 2023-12-30 ENCOUNTER — Other Ambulatory Visit: Payer: Self-pay | Admitting: Family Medicine

## 2023-12-30 DIAGNOSIS — T7840XA Allergy, unspecified, initial encounter: Secondary | ICD-10-CM

## 2024-01-01 ENCOUNTER — Ambulatory Visit

## 2024-01-01 DIAGNOSIS — J309 Allergic rhinitis, unspecified: Secondary | ICD-10-CM

## 2024-01-05 ENCOUNTER — Other Ambulatory Visit: Payer: Self-pay | Admitting: Family Medicine

## 2024-01-05 DIAGNOSIS — G43909 Migraine, unspecified, not intractable, without status migrainosus: Secondary | ICD-10-CM

## 2024-01-08 ENCOUNTER — Other Ambulatory Visit (HOSPITAL_BASED_OUTPATIENT_CLINIC_OR_DEPARTMENT_OTHER): Payer: Self-pay

## 2024-01-08 ENCOUNTER — Encounter: Payer: Self-pay | Admitting: Family Medicine

## 2024-01-08 ENCOUNTER — Ambulatory Visit: Admitting: Family Medicine

## 2024-01-08 ENCOUNTER — Ambulatory Visit: Payer: Self-pay | Admitting: Family Medicine

## 2024-01-08 ENCOUNTER — Encounter: Payer: Self-pay | Admitting: Neurology

## 2024-01-08 ENCOUNTER — Ambulatory Visit

## 2024-01-08 ENCOUNTER — Other Ambulatory Visit (HOSPITAL_COMMUNITY)
Admission: RE | Admit: 2024-01-08 | Discharge: 2024-01-08 | Disposition: A | Source: Ambulatory Visit | Attending: Family Medicine | Admitting: Family Medicine

## 2024-01-08 ENCOUNTER — Other Ambulatory Visit: Payer: Self-pay

## 2024-01-08 VITALS — BP 130/80 | HR 95 | Temp 98.0°F | Resp 16 | Ht 69.0 in | Wt 176.4 lb

## 2024-01-08 DIAGNOSIS — F411 Generalized anxiety disorder: Secondary | ICD-10-CM

## 2024-01-08 DIAGNOSIS — Z114 Encounter for screening for human immunodeficiency virus [HIV]: Secondary | ICD-10-CM

## 2024-01-08 DIAGNOSIS — J309 Allergic rhinitis, unspecified: Secondary | ICD-10-CM | POA: Diagnosis not present

## 2024-01-08 DIAGNOSIS — Z113 Encounter for screening for infections with a predominantly sexual mode of transmission: Secondary | ICD-10-CM

## 2024-01-08 DIAGNOSIS — E782 Mixed hyperlipidemia: Secondary | ICD-10-CM

## 2024-01-08 DIAGNOSIS — E1165 Type 2 diabetes mellitus with hyperglycemia: Secondary | ICD-10-CM

## 2024-01-08 DIAGNOSIS — F419 Anxiety disorder, unspecified: Secondary | ICD-10-CM

## 2024-01-08 LAB — COMPREHENSIVE METABOLIC PANEL WITH GFR
ALT: 19 U/L (ref 0–35)
AST: 21 U/L (ref 0–37)
Albumin: 4.7 g/dL (ref 3.5–5.2)
Alkaline Phosphatase: 80 U/L (ref 39–117)
BUN: 11 mg/dL (ref 6–23)
CO2: 26 meq/L (ref 19–32)
Calcium: 9.5 mg/dL (ref 8.4–10.5)
Chloride: 107 meq/L (ref 96–112)
Creatinine, Ser: 0.95 mg/dL (ref 0.40–1.20)
GFR: 73.3 mL/min (ref >60.00)
Glucose, Bld: 97 mg/dL (ref 70–99)
Potassium: 4.2 meq/L (ref 3.5–5.1)
Sodium: 141 meq/L (ref 135–145)
Total Bilirubin: 0.3 mg/dL (ref 0.2–1.2)
Total Protein: 7.4 g/dL (ref 6.0–8.3)

## 2024-01-08 LAB — LIPID PANEL
Cholesterol: 132 mg/dL (ref 0–200)
HDL: 49.7 mg/dL (ref >39.00)
LDL Cholesterol: 68 mg/dL (ref 0–99)
NonHDL: 82.03
Total CHOL/HDL Ratio: 3
Triglycerides: 72 mg/dL (ref 0.0–149.0)
VLDL: 14.4 mg/dL (ref 0.0–40.0)

## 2024-01-08 LAB — HEMOGLOBIN A1C: Hgb A1c MFr Bld: 5.8 % (ref 4.6–6.5)

## 2024-01-08 MED ORDER — NURTEC 75 MG PO TBDP
75.0000 mg | ORAL_TABLET | Freq: Every day | ORAL | 2 refills | Status: AC | PRN
  Filled 2024-01-08: qty 16, 30d supply, fill #0
  Filled 2024-02-12: qty 16, 30d supply, fill #1

## 2024-01-08 MED ORDER — ALPRAZOLAM 0.5 MG PO TABS
0.5000 mg | ORAL_TABLET | Freq: Every day | ORAL | 2 refills | Status: AC | PRN
  Filled 2024-01-08: qty 60, 60d supply, fill #0
  Filled 2024-01-27 – 2024-03-08 (×6): qty 60, 60d supply, fill #1

## 2024-01-08 NOTE — Patient Instructions (Signed)
 Give us  2-3 business days to get the results of your labs back.   Keep the diet clean and stay active.  Let us  know if you need anything.

## 2024-01-08 NOTE — Progress Notes (Signed)
 Subjective:   Chief Complaint  Patient presents with   Diabetes    Diabetes     Maria Johnson is a 43 y.o. female here for follow-up of diabetes.   Maria Johnson's self monitored glucose range is 70-80's fasting.  Patient denies hypoglycemic reactions. Having intermittent night sweats.  She checks her glucose levels several times per week Patient does not require insulin .   Medications include: Mounjaro  15 mg/week.  Diet is overall healthy.  Exercise: walking, lifting wts  Hyperlipidemia Patient presents for mixed hyperlipidemia follow up. Currently being treated with Lipitor 40 mg/d and compliance with treatment thus far has been good. She denies myalgias. Diet/exercise as above. No CP or SOB.   The patient is not known to have coexisting coronary artery disease.  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   Pes planus 10/22/2015   Pneumonia    Sickle cell trait    SOBOE (shortness of breath on exertion)    Sprain of medial collateral ligament of left knee 10/30/2018   Type 2 diabetes mellitus with hyperglycemia (HCC) 08/12/2013   Vitamin D  deficiency 08/24/2017   Well adult exam 09/25/2017     Related testing: Retinal exam: Done Pneumovax: done  Objective:  BP 130/80 (BP Location: Left Arm, Patient Position: Sitting)   Pulse 95   Temp 98 F (36.7 C) (Oral)   Resp 16   Ht 5' 9 (1.753 m)   Wt 176 lb 6.4 oz (80 kg)   LMP 08/09/2007   SpO2 98%   BMI 26.05 kg/m  General:  Well developed, well nourished, in  no apparent distress Lungs:  CTAB, no access msc use Cardio:  RRR, no bruits, no LE edema Psych: Age appropriate judgment and insight  Assessment:   Type 2 diabetes mellitus with hyperglycemia, without long-term current use of insulin  (HCC) - Plan: Comprehensive metabolic panel with GFR, Hemoglobin A1c, Lipid panel  Mixed hyperlipidemia  Anxiety - Plan: ALPRAZolam  (XANAX ) 0.5 MG tablet  Generalized anxiety disorder  Screening for HIV without presence of risk factors - Plan: HIV Antibody (routine testing w rflx)  Screening examination for STI - Plan: Cervicovaginal ancillary only( )   Plan:   Chronic, stable.  Continue Mounjaro  15 mg weekly.  Counseled on diet and exercise. Chronic, stable.  Continue Lipitor 40 mg daily.  She has had a partial hysterectomy. F/u in 6 mo. The patient voiced understanding and agreement to the plan.  Mabel [place], DO 01/08/24 10:27 AM

## 2024-01-09 ENCOUNTER — Ambulatory Visit: Admitting: Dermatology

## 2024-01-09 ENCOUNTER — Other Ambulatory Visit (HOSPITAL_BASED_OUTPATIENT_CLINIC_OR_DEPARTMENT_OTHER): Payer: Self-pay

## 2024-01-09 ENCOUNTER — Encounter: Payer: Self-pay | Admitting: Dermatology

## 2024-01-09 ENCOUNTER — Other Ambulatory Visit: Payer: Self-pay

## 2024-01-09 VITALS — BP 110/80

## 2024-01-09 DIAGNOSIS — L658 Other specified nonscarring hair loss: Secondary | ICD-10-CM

## 2024-01-09 LAB — CERVICOVAGINAL ANCILLARY ONLY
Chlamydia: NEGATIVE
Comment: NEGATIVE
Comment: NORMAL
Neisseria Gonorrhea: NEGATIVE

## 2024-01-09 LAB — HIV ANTIBODY (ROUTINE TESTING W REFLEX)
HIV 1&2 Ab, 4th Generation: NONREACTIVE
HIV FINAL INTERPRETATION: NEGATIVE

## 2024-01-09 MED ORDER — FLUOCINOLONE ACETONIDE SCALP 0.01 % EX OIL
TOPICAL_OIL | CUTANEOUS | 11 refills | Status: AC
  Filled 2024-01-09: qty 118.28, 30d supply, fill #0
  Filled 2024-02-03: qty 118.28, 30d supply, fill #1
  Filled 2024-03-08: qty 118.28, 30d supply, fill #2

## 2024-01-09 MED ORDER — SAFETY SEAL MISCELLANEOUS MISC: 1.0000 | Freq: Every morning | 6 refills | Status: AC

## 2024-01-09 MED ORDER — CLOBETASOL PROPIONATE 0.05 % EX SOLN
1.0000 | Freq: Two times a day (BID) | CUTANEOUS | 0 refills | Status: AC
  Filled 2024-01-09: qty 50, 25d supply, fill #0

## 2024-01-09 NOTE — Patient Instructions (Addendum)
 VISIT SUMMARY:  Today, we discussed the management of your scalp condition, including seborrheic dermatitis and traction alopecia. You reported flaking on your scalp, which is more noticeable during this time of year, and you shared your current hair care regimen. We also reviewed your history of traction alopecia and the presence of a lipoma on your back.  YOUR PLAN:  -SEBORRHEIC DERMATITIS OF THE SCALP:  Seborrheic dermatitis is a common skin condition that causes flaking and irritation, often worsening in winter.   To manage this, we recommend DHS Zinc shampoo to use on wash day, allowing it to sit for three minutes before rinsing.   You should avoid using coconut oil on your scalp as it may worsen the condition, but you can use it on your hair.   Additionally, you have been prescribed Derma Smooth scalp oil to apply to your scalp and hair, leaving it for a couple of hours before washing it out.   Clobetasol  drops should be used on symptomatic patches, especially before shampoo day. Refills for all prescribed treatments have been provided.  -TRACTION ALOPECIA:  Traction alopecia is hair loss caused by tension on the hair, often from hairstyles. Your hair regrowth is progressing well, and you should continue your current hair care routine.  Refills for Medrock compound, minoxidil, and clobetasol  have been provided. Be cautious with hair coloring to prevent breakage.  INSTRUCTIONS:  Please follow the prescribed treatment plan for your seborrheic dermatitis and traction alopecia. Use the DHS Zinc shampoo on wash day, avoid coconut oil on your scalp, and apply Derma Smooth scalp oil as directed. Use clobetasol  drops on symptomatic patches, especially before shampoo day. Continue your current hair care routine for traction alopecia and be cautious with hair coloring. Refills for all your treatments have been provided. Important Information  Due to recent changes in healthcare laws, you may  see results of your pathology and/or laboratory studies on MyChart before the doctors have had a chance to review them. We understand that in some cases there may be results that are confusing or concerning to you. Please understand that not all results are received at the same time and often the doctors may need to interpret multiple results in order to provide you with the best plan of care or course of treatment. Therefore, we ask that you please give us  2 business days to thoroughly review all your results before contacting the office for clarification. Should we see a critical lab result, you will be contacted sooner.   If You Need Anything After Your Visit  If you have any questions or concerns for your doctor, please call our main line at [phone] If no one answers, please leave a voicemail as directed and we will return your call as soon as possible. Messages left after 4 pm will be answered the following business day.   You may also send us  a message via MyChart. We typically respond to MyChart messages within 1-2 business days.  For prescription refills, please ask your pharmacy to contact our office. Our fax number is [phone].  If you have an urgent issue when the clinic is closed that cannot wait until the next business day, you can page your doctor at the number below.    Please note that while we do our best to be available for urgent issues outside of office hours, we are not available 24/7.   If you have an urgent issue and are unable to reach us , you may choose to seek  medical care at your doctor's office, retail clinic, urgent care center, or emergency room.  If you have a medical emergency, please immediately call 911 or go to the emergency department. In the event of inclement weather, please call our main line at [phone] for an update on the status of any delays or closures.  Dermatology Medication Tips: Please keep the boxes that topical medications come in in  order to help keep track of the instructions about where and how to use these. Pharmacies typically print the medication instructions only on the boxes and not directly on the medication tubes.   If your medication is too expensive, please contact our office at [phone] or send us  a message through MyChart.   We are unable to tell what your co-pay for medications will be in advance as this is different depending on your insurance coverage. However, we may be able to find a substitute medication at lower cost or fill out paperwork to get insurance to cover a needed medication.   If a prior authorization is required to get your medication covered by your insurance company, please allow us  1-2 business days to complete this process.  Drug prices often vary depending on where the prescription is filled and some pharmacies may offer cheaper prices.  The website www.goodrx.com contains coupons for medications through different pharmacies. The prices here do not account for what the cost may be with help from insurance (it may be cheaper with your insurance), but the website can give you the price if you did not use any insurance.  - You can print the associated coupon and take it with your prescription to the pharmacy.  - You may also stop by our office during regular business hours and pick up a GoodRx coupon card.  - If you need your prescription sent electronically to a different pharmacy, notify our office through [facility] or by phone at [phone]

## 2024-01-09 NOTE — Progress Notes (Signed)
° °  Follow-Up Visit   Subjective  Maria Johnson is a 43 y.o. female established patient who presents for FOLLOW UP on the diagnoses listed below:  Patient (and/or pt guardian) consented to the use of AI-assisted tools for note generation.  Patient was last evaluated on 07/06/23.   Traction Alopecia: Pt stated that she has been applying Medrock minoxidil/clobetasol  compound every morning. She slso stated since switching from the Vital Protein tablets to powder she has had no issues with taste. She stated that she does see improvement but when her Seb Derm flares she loses progress around the hairline.    The following portions of the chart were reviewed this encounter and updated as appropriate: medications, allergies, medical history  Review of Systems:  No other skin or systemic complaints except as noted in HPI or Assessment and Plan.  Objective  Well appearing patient in no apparent distress; mood and affect are within normal limits.   A focused examination was performed of the following areas: scalp   Relevant exam findings are noted in the Assessment and Plan.           Assessment & Plan    Seborrheic dermatitis of the scalp Seborrheic dermatitis is flaring, particularly during winter, with flaking and irritation. Coconut oil use may exacerbate the condition by feeding the yeast involved.  - Prescribed DHS Zinc shampoo (2% zinc) to be used on wash day, allowing it to sit for three minutes before rinsing. - Advised against using coconut oil on the scalp; recommended using it on hair instead. - Prescribed Derma Smooth scalp oil to be applied to the scalp and hair, left for a couple of hours, and then washed out. - Prescribed clobetasol  drops for use on symptomatic patches, especially leading up to shampoo day. - Provided refills for all prescribed treatments.  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 Hair coloring is causing hair to turn blonde, but no  significant breakage is reported. - Continue current hair care routine. - Refilled Medrock compound, minoxidil, and clobetasol . - Advised caution with hair coloring to prevent breakage.   Lipoma  Exam: 4mm Subcutaneous rubbery nodule Location: mid-back  Benign-appearing. Exam most consistent with an Lipoma. Discussed that a Lipoma is a benign fatty growth that can grow over time and sometimes become painful or otherwise symptomatic. Some patients may have one or several lipomas.. Benign Hereditary Lipomatosis is a hereditary familial condition where family members tend to grow multiple lipomas.  Recommend observation if it is not changing, growing or symptomatic. Recommend surgical excision to remove it if it is painful, growing, symptomatic, or other changes noted. Please contact our office for new or changing lesions so they can be evaluated.     No follow-ups on file.   Documentation: I have reviewed the above documentation for accuracy and completeness, and I agree with the above.  I, Shirron Maranda, CMA II, am acting as scribe for:  Delon Lenis, DO

## 2024-01-10 ENCOUNTER — Encounter: Payer: Self-pay | Admitting: Family Medicine

## 2024-01-10 DIAGNOSIS — M79644 Pain in right finger(s): Secondary | ICD-10-CM

## 2024-01-13 ENCOUNTER — Other Ambulatory Visit: Payer: Self-pay | Admitting: Family Medicine

## 2024-01-15 ENCOUNTER — Other Ambulatory Visit: Payer: Self-pay

## 2024-01-15 ENCOUNTER — Other Ambulatory Visit (HOSPITAL_BASED_OUTPATIENT_CLINIC_OR_DEPARTMENT_OTHER): Payer: Self-pay

## 2024-01-15 MED ORDER — CITALOPRAM HYDROBROMIDE 20 MG PO TABS
20.0000 mg | ORAL_TABLET | Freq: Every day | ORAL | 3 refills | Status: AC
  Filled 2024-01-15: qty 30, 30d supply, fill #0
  Filled 2024-02-19: qty 30, 30d supply, fill #1

## 2024-01-19 ENCOUNTER — Encounter: Payer: Self-pay | Admitting: Family Medicine

## 2024-01-19 ENCOUNTER — Other Ambulatory Visit (HOSPITAL_BASED_OUTPATIENT_CLINIC_OR_DEPARTMENT_OTHER): Payer: Self-pay

## 2024-01-19 ENCOUNTER — Other Ambulatory Visit: Payer: Self-pay

## 2024-01-19 MED ORDER — ACCU-CHEK GUIDE W/DEVICE KIT
PACK | 2 refills | Status: AC
  Filled 2024-01-19: qty 1, 90d supply, fill #0
  Filled 2024-01-27 – 2024-03-08 (×2): qty 1, 90d supply, fill #1

## 2024-01-29 ENCOUNTER — Other Ambulatory Visit (HOSPITAL_BASED_OUTPATIENT_CLINIC_OR_DEPARTMENT_OTHER): Payer: Self-pay

## 2024-01-29 ENCOUNTER — Other Ambulatory Visit: Payer: Self-pay

## 2024-01-30 ENCOUNTER — Other Ambulatory Visit (HOSPITAL_BASED_OUTPATIENT_CLINIC_OR_DEPARTMENT_OTHER): Payer: Self-pay

## 2024-01-31 ENCOUNTER — Other Ambulatory Visit (HOSPITAL_BASED_OUTPATIENT_CLINIC_OR_DEPARTMENT_OTHER): Payer: Self-pay

## 2024-02-05 ENCOUNTER — Other Ambulatory Visit: Payer: Self-pay

## 2024-02-05 ENCOUNTER — Other Ambulatory Visit (HOSPITAL_BASED_OUTPATIENT_CLINIC_OR_DEPARTMENT_OTHER): Payer: Self-pay

## 2024-02-07 ENCOUNTER — Other Ambulatory Visit (HOSPITAL_BASED_OUTPATIENT_CLINIC_OR_DEPARTMENT_OTHER): Payer: Self-pay

## 2024-02-09 ENCOUNTER — Other Ambulatory Visit (HOSPITAL_BASED_OUTPATIENT_CLINIC_OR_DEPARTMENT_OTHER): Payer: Self-pay

## 2024-02-12 ENCOUNTER — Other Ambulatory Visit (HOSPITAL_BASED_OUTPATIENT_CLINIC_OR_DEPARTMENT_OTHER): Payer: Self-pay

## 2024-02-12 ENCOUNTER — Ambulatory Visit

## 2024-02-12 DIAGNOSIS — J302 Other seasonal allergic rhinitis: Secondary | ICD-10-CM | POA: Diagnosis not present

## 2024-02-13 ENCOUNTER — Other Ambulatory Visit: Payer: Self-pay

## 2024-02-19 ENCOUNTER — Other Ambulatory Visit (HOSPITAL_COMMUNITY)
Admission: RE | Admit: 2024-02-19 | Discharge: 2024-02-19 | Disposition: A | Discharge status: Home / Self Care | Source: Ambulatory Visit | Attending: Obstetrics & Gynecology | Admitting: Obstetrics & Gynecology

## 2024-02-19 ENCOUNTER — Ambulatory Visit

## 2024-02-19 ENCOUNTER — Other Ambulatory Visit (HOSPITAL_BASED_OUTPATIENT_CLINIC_OR_DEPARTMENT_OTHER): Payer: Self-pay

## 2024-02-19 VITALS — BP 110/77 | HR 90 | Ht 69.0 in | Wt 179.0 lb

## 2024-02-19 DIAGNOSIS — Z113 Encounter for screening for infections with a predominantly sexual mode of transmission: Secondary | ICD-10-CM

## 2024-02-19 NOTE — Progress Notes (Signed)
 SUBJECTIVE:  44 y.o. female who desires a STI screen. Denies abnormal vaginal discharge, bleeding or significant pelvic pain. No UTI symptoms. Denies history of known exposure to STD.  Patient's last menstrual period was 08/09/2007.  OBJECTIVE:  She appears well.   ASSESSMENT:  STI Screen   PLAN:  Pt offered STI blood screening-requested GC, chlamydia, and trichomonas probe sent to lab.  Treatment: To be determined once lab results are received.  Pt follow up as needed.   Erminio DELENA Rumps, RN

## 2024-02-21 ENCOUNTER — Ambulatory Visit: Payer: Self-pay | Admitting: Obstetrics & Gynecology

## 2024-02-21 LAB — CERVICOVAGINAL ANCILLARY ONLY
Bacterial Vaginitis (gardnerella): NEGATIVE
Candida Glabrata: NEGATIVE
Candida Vaginitis: NEGATIVE
Chlamydia: NEGATIVE
Comment: NEGATIVE
Comment: NEGATIVE
Comment: NEGATIVE
Comment: NEGATIVE
Comment: NEGATIVE
Comment: NORMAL
Neisseria Gonorrhea: NEGATIVE
Trichomonas: NEGATIVE

## 2024-02-23 LAB — RPR+HBSAG+HCVAB+...
HIV Screen 4th Generation wRfx: NONREACTIVE
Hep C Virus Ab: NONREACTIVE
Hepatitis B Surface Ag: NEGATIVE
RPR Ser Ql: NONREACTIVE

## 2024-03-03 ENCOUNTER — Encounter: Payer: Self-pay | Admitting: Family Medicine

## 2024-03-04 ENCOUNTER — Other Ambulatory Visit: Payer: Self-pay | Admitting: Family Medicine

## 2024-03-04 ENCOUNTER — Other Ambulatory Visit (HOSPITAL_BASED_OUTPATIENT_CLINIC_OR_DEPARTMENT_OTHER): Payer: Self-pay

## 2024-03-04 MED ORDER — VALACYCLOVIR HCL 1 G PO TABS
ORAL_TABLET | ORAL | 1 refills | Status: AC
  Filled 2024-03-04: qty 30, 8d supply, fill #0
  Filled 2024-03-08: qty 30, 8d supply, fill #1

## 2024-03-08 ENCOUNTER — Other Ambulatory Visit: Payer: Self-pay

## 2024-03-08 ENCOUNTER — Other Ambulatory Visit (HOSPITAL_BASED_OUTPATIENT_CLINIC_OR_DEPARTMENT_OTHER): Payer: Self-pay

## 2024-03-18 ENCOUNTER — Ambulatory Visit: Admitting: Obstetrics & Gynecology

## 2024-07-08 ENCOUNTER — Encounter: Admitting: Family Medicine

## 2024-07-29 ENCOUNTER — Ambulatory Visit: Admitting: Neurology
